# Patient Record
Sex: Female | Born: 1960 | Race: White | Hispanic: No | Marital: Married | Smoking: Current some day smoker
Health system: Southern US, Community
[De-identification: ages and names within clinical notes are randomized; demographics above are authoritative.]

## PROBLEM LIST (undated history)

## (undated) ENCOUNTER — Ambulatory Visit

## (undated) DIAGNOSIS — R519 Headache, unspecified: Secondary | ICD-10-CM

## (undated) DIAGNOSIS — K219 Gastro-esophageal reflux disease without esophagitis: Secondary | ICD-10-CM

## (undated) DIAGNOSIS — R002 Palpitations: Secondary | ICD-10-CM

## (undated) DIAGNOSIS — K5900 Constipation, unspecified: Secondary | ICD-10-CM

## (undated) DIAGNOSIS — J189 Pneumonia, unspecified organism: Secondary | ICD-10-CM

## (undated) DIAGNOSIS — B958 Unspecified staphylococcus as the cause of diseases classified elsewhere: Secondary | ICD-10-CM

## (undated) DIAGNOSIS — J449 Chronic obstructive pulmonary disease, unspecified: Secondary | ICD-10-CM

## (undated) DIAGNOSIS — M1990 Unspecified osteoarthritis, unspecified site: Secondary | ICD-10-CM

## (undated) DIAGNOSIS — Z8489 Family history of other specified conditions: Secondary | ICD-10-CM

## (undated) DIAGNOSIS — K828 Other specified diseases of gallbladder: Secondary | ICD-10-CM

## (undated) DIAGNOSIS — R51 Headache: Secondary | ICD-10-CM

## (undated) DIAGNOSIS — F419 Anxiety disorder, unspecified: Secondary | ICD-10-CM

## (undated) DIAGNOSIS — I10 Essential (primary) hypertension: Secondary | ICD-10-CM

## (undated) HISTORY — PX: TONSILLECTOMY: SUR1361

## (undated) HISTORY — PX: TUBAL LIGATION: SHX77

## (undated) HISTORY — PX: GROIN DEBRIDEMENT: SHX5159

---

## 1997-12-22 ENCOUNTER — Emergency Department (HOSPITAL_COMMUNITY): Admission: EM | Admit: 1997-12-22 | Discharge: 1997-12-22 | Payer: Self-pay | Admitting: Emergency Medicine

## 2000-02-12 ENCOUNTER — Emergency Department (HOSPITAL_COMMUNITY): Admission: EM | Admit: 2000-02-12 | Discharge: 2000-02-13 | Payer: Self-pay | Admitting: Emergency Medicine

## 2000-12-14 ENCOUNTER — Encounter: Admission: RE | Admit: 2000-12-14 | Discharge: 2000-12-14 | Payer: Self-pay | Admitting: Internal Medicine

## 2003-08-24 ENCOUNTER — Inpatient Hospital Stay (HOSPITAL_COMMUNITY): Admission: EM | Admit: 2003-08-24 | Discharge: 2003-08-28 | Payer: Self-pay | Admitting: Emergency Medicine

## 2003-11-17 ENCOUNTER — Inpatient Hospital Stay (HOSPITAL_COMMUNITY): Admission: EM | Admit: 2003-11-17 | Discharge: 2003-11-19 | Payer: Self-pay | Admitting: Emergency Medicine

## 2004-03-03 ENCOUNTER — Inpatient Hospital Stay (HOSPITAL_COMMUNITY): Admission: EM | Admit: 2004-03-03 | Discharge: 2004-03-04 | Payer: Self-pay | Admitting: Emergency Medicine

## 2004-03-05 ENCOUNTER — Ambulatory Visit: Payer: Self-pay | Admitting: Internal Medicine

## 2004-03-05 ENCOUNTER — Inpatient Hospital Stay (HOSPITAL_COMMUNITY): Admission: EM | Admit: 2004-03-05 | Discharge: 2004-03-09 | Payer: Self-pay | Admitting: Emergency Medicine

## 2005-05-17 ENCOUNTER — Inpatient Hospital Stay (HOSPITAL_COMMUNITY): Admission: EM | Admit: 2005-05-17 | Discharge: 2005-05-19 | Payer: Self-pay | Admitting: Emergency Medicine

## 2007-06-13 ENCOUNTER — Emergency Department (HOSPITAL_COMMUNITY): Admission: EM | Admit: 2007-06-13 | Discharge: 2007-06-14 | Payer: Self-pay | Admitting: Emergency Medicine

## 2008-03-17 ENCOUNTER — Emergency Department (HOSPITAL_COMMUNITY): Admission: EM | Admit: 2008-03-17 | Discharge: 2008-03-17 | Payer: Self-pay | Admitting: Emergency Medicine

## 2008-09-12 ENCOUNTER — Emergency Department (HOSPITAL_COMMUNITY): Admission: EM | Admit: 2008-09-12 | Discharge: 2008-09-12 | Payer: Self-pay | Admitting: Family Medicine

## 2010-08-13 NOTE — Consult Note (Signed)
[name], GRAYS                 ACCOUNT NO.:  [id number]   MEDICAL RECORD NO.:  [id number]          PATIENT TYPE:  INP   LOCATION:  5714                         FACILITY:  [facility]   PHYSICIAN:  Jefry H. Pollyann Kennedy, MD     DATE OF BIRTH:  Jun 23, 1960   DATE OF CONSULTATION:  03/03/2004  DATE OF DISCHARGE:                                   CONSULTATION   REASON FOR CONSULTATION:  Facial cellulitis.   HISTORY:  This is a 50 year old with a history of MRSA cellulitis on two  occasions in the past six months or so.  About two days ago, she developed a  small pimple like lesion on the right cheek and over 48 hours, it has  progressed and today she developed severe swelling around the right eye.  She is being admitted to the hospital by inpatient service and is going to  be started on intravenous vancomycin for presumed MRSA.   PAST MEDICAL HISTORY:  Significant for COPD and heavy smoking history.  The  remainder of the past medical history is on the admission history and  physical.   PHYSICAL EXAMINATION:  She is a relatively healthy appearing lady awake and  alert without any respiratory distress.  There is no palpable cervical  adenopathy.  Oral cavity and pharynx are clear.  The dentition is in good  repair.  Nasal exam is clear, as well.  There is significant eyelid edema of  the right upper and lower eyelids.  When open, the globe appears to be  normal with normal mobility.  Gross visual acuity is intact.  There is a  small scab on the right maxillary prominence with surrounding erythema and  some tenderness.  There is pale edema of the eyelids.  The erythema extends  for approximately 2-3 cm.  The remainder of the face is unremarkable.   IMPRESSION:  Probable recent MRSA facial cellulitis, possible very early  small abscess.   RECOMMENDATIONS:  Initiate intravenous  antibiotics and I will re-evaluate  in 24 hours.  If there is no significant improvement, then I will perform  incision  and drainage at the bedside.      Jefr   JHR/MEDQ  D:  03/03/2004  T:  03/03/2004  Job:  [id number]

## 2010-08-13 NOTE — Discharge Summary (Signed)
[name], [name] NO.:  [id number]   MEDICAL RECORD NO.:  [id number]          PATIENT TYPE:  INP   LOCATION:  1415                         FACILITY:  [facility]   PHYSICIAN:  Sherin Quarry, MD      DATE OF BIRTH:  Oct 22, 1960   DATE OF ADMISSION:  05/17/2005  DATE OF DISCHARGE:                                 DISCHARGE SUMMARY   [name] is a 50 year old lady who was initially transported to the  [facility] emergency room on February 20 with a report of making a suicide  attempt and cutting her wrists. In the emergency room, the patient was  lethargic and was given IV Narcan and with no change in her status. Her  urine drug screen was subsequently positive for marijuana and cocaine. The  patient's suicide attempt was apparently triggered by an argument with her  boyfriend.   PAST MEDICAL HISTORY:  Her past history is largely remarkable for a  longstanding history of polysubstance abuse and a heavy cigarette smoking  with COPD.   PHYSICAL EXAMINATION:  Initial physical exam as described by Dr. Derenda Mis:  VITAL SIGNS:  The temperature was 97.8, blood pressure 121/73, pulse was 87,  respirations 27.  GENERAL:  The patient was a disheveled somnolent lady who would arouse to  painful stimuli.  HEENT:  Within normal limits.  CHEST:  The chest was remarkable for upper airway rhonchi and diminished  breath sounds.  CARDIOVASCULAR:  Revealed a regular rate and rhythm.  ABDOMEN:  The abdomen was benign.  NEUROLOGIC:  On neurologic testing, the patient would arouse to aggressive  stimuli. She would not follow commands. She was noted to move all  extremities.  EXTREMITIES:  On examination of the extremities, the patient had a  superficial laceration of the left wrist.   LABORATORY DATA:  Relevant laboratory studies obtained included a BMET which  was normal. The blood alcohol level was less than 5. A urine drug screen was  positive for benzodiazepines and  cocaine. CBC, white count 7400, hemoglobin  12.7, liver functions were normal. Serial cardiac enzymes were negative. The  patient had arterial blood gases obtained and these showed a pO2 of 79, pCO2  of 37. On room air O2 sat was 96%. A chest x-ray showed evidence of  bronchitis. A CAT scan of the head was negative.   HOSPITAL COURSE:  On admission the patient was placed on an IV of normal  saline at 75 mL/hour. Zofran was administered p.r.n. for nausea and Xopenex  nebulizer p.r.n. for wheezing. Avelox was started empirically at a dose of  400 mg daily for possible bronchitis. The next day the patient became much  more alert. Her Foley catheter was discontinued. She was seen by Dr.  Jeanie Sewer to the psychiatry department. It was the general opinion of  Dr.  Jeanie Sewer that the patient's suicide attempt was largely secondary to the  influence of drugs and that she was no longer a risk to herself or others.  She does not meet the criteria for commitment but indicated after medical  clearance she could be discharged. By February 22, the patient was feeling  well. She had no respiratory distress, no chest pain, no abdominal  discomfort. She was alert. It was therefore felt reasonable to proceed with  discharge. At the time of discharge I urged her to follow through with the  recommendations of Dr. Jeanie Sewer for outpatient mental health care through  the mental health clinic. She smokes about two packs of cigarettes per day  and in the past had used a nebulizer on a regular basis. I therefore  provided her with prescriptions for albuterol for her nebulizer as well as  for a Combivent meter dose inhaler to use p.r.n. She was also given a  prescription for Avelox with instructions to take this medication for two  additional days for bronchitis. I urged her to  establish a primary care  doctor. She said that in the past she had been to the [facility] outpatient  clinic for her medical care and I  suggested that she continue to follow up  with this facility.   DISCHARGE DIAGNOSES:  1.  Suicide attempt.  2.  Cocaine and Klonopin abuse.  3.  Chronic obstructive pulmonary disease.  4.  History of MRSA cellulitis.           ______________________________  Sherin Quarry, MD     SY/MEDQ  D:  05/19/2005  T:  05/20/2005  Job:  [id number]   cc:   Antonietta Breach, M.D.

## 2010-08-13 NOTE — Consult Note (Signed)
[name], NO                             ACCOUNT NO.:  [id number]   MEDICAL RECORD NO.:  [id number]                   PATIENT TYPE:  INP   LOCATION:  5737                                 FACILITY:  [facility]   PHYSICIAN:  Dionne Ano. Everlene Other, M.D.         DATE OF BIRTH:  Oct 07, 1960   DATE OF CONSULTATION:  11/17/2003  DATE OF DISCHARGE:                                   CONSULTATION   I had the pleasure to see [name] for hand and upper extremity  consultation about the right hand.  This patient is a 50 year old female  with history of MRSA infection that was I&Dd in June of 2005.  She presented  to the emergency room with MRSA infection supposed about the right thumb.  She also had right axillary and right inguinal regions which were I&Dd in  the emergency room.  She is known to have a history of COPD, low back pain,  tobacco, and cocaine abuse as well.  She denies trauma to the area.  She  denies numbness and tingling in her thumb.  I have discussed her all issues.   The patient will be checked in regards to her tetanus status and it will be  updated, if appropriate I should know.   PAST MEDICAL HISTORY:  1. COPD.  2. Degenerative disk disease.   PAST SURGICAL HISTORY:  1. I&D of her leg June 2005.  2. Bilateral tubal ligation.   MEDICATIONS:  None.   ALLERGIES:  PENICILLIN, CODEINE, a likely VANCOMYCIN allergy.  She had no  reaction during vancomycin administration today.   SOCIAL HISTORY:  She is unemployed.  Smokes a quarter of a pack per day.  She recently used cocaine within the last 24 hours.  She also consumes  marijuana on a regular basis.   PHYSICAL EXAMINATION:  GENERAL:  She is alert and oriented, in no acute  distress.  EXTREMITIES:  The patient's right hand has a large abscess over the thumb.  This is painful to touch.  There is erythema and no gross signs of  dysvascular changes.  EPL and FPL are intact.  There is no signs of  dystrophic features.   The shoulder joint, elbow joint, and wrist joint are  nontender.  She has had an I&D of her inguinal region and axillary region;  however, I did not take these down and examine them.   IMPRESSION:  Abscess right thumb.   PLAN:  I have discussed her options for treatment.  She desires to proceed  with surgical intervention.   She is taken to surgical area on the 5700 floor.  She was given __________  metacarpal block followed by a Betadine scrub and paint.  She was sterilely  secured and underwent an incision radial laterally about the thumb.  Dissection was carried down and large amount of purulence was obtained.  Aerobic and anaerobic cultures were taken.  This  was deep space I&D.  Perinecrotic tissue was removed.  She was irrigated with greater than 1 L of  fluid and following this was packed open.  I will have her monitored  closely.  Sterile dressing was applied and refill was intact.  We are going  to have her elevate the thumb.  Will begin whirlpools tomorrow and pack her  with iodoform daily.  I have discussed with the patient __________, etc. and  plans for treatment in the future.  Will monitor condition quite closely.  All questions have been encouraged and answered.  I would certainly  recommend ID consult given her history of MRSA infection, so wait the  culture results.  If this is MRSA I would likely pursue prevention measures  and discuss these issues as well as lifestyle modification changes to the  patient as well.                                               Dionne Ano. Everlene Other, M.D.    Nash Mantis  D:  11/17/2003  T:  11/17/2003  Job:  [id number]   cc:   C. Ulyess Mort, M.D.  [address]  [place]  [place] [address]  Fax: [phone]

## 2010-08-13 NOTE — Discharge Summary (Signed)
[name], [name]                 ACCOUNT NO.:  [id number]   MEDICAL RECORD NO.:  [id number]          PATIENT TYPE:  INP   LOCATION:  5714                         FACILITY:  [facility]   PHYSICIAN:  C. Ulyess Mort, M.D.DATE OF BIRTH:  1961-03-03   DATE OF ADMISSION:  03/03/2004  DATE OF DISCHARGE:  03/04/2004                                 DISCHARGE SUMMARY   CONTINUITY CLINIC PHYSICIAN/OUTPATIENT CLINIC PHYSICIAN:  Chapman Fitch, M.D.   CONSULTANTSEnrigue Catena H. Pollyann Kennedy, M.D. of ear, nose, and throat specialty.   DISCHARGE DIAGNOSES:  1.  Methicillin-resistant Staphylococcus aureus cellulitis of right eye.  2.  Chronic obstructive pulmonary disease.  3.  Polysubstance abuse.   DISCHARGE MEDICATIONS:  1.  Doxycycline 100 mg b.i.d.  2.  Percocet 5/325, 1-2 tabs every four hours for pain.  3.  Albuterol inhaler.   PROCEDURES:  None.   CHIEF COMPLAINT:  Right eye swelling.   HISTORY OF PRESENT ILLNESS:  A 50 year old female present with right eye  swelling and multiple prior MRSA infections.  A two day history of swelling  and itching.  The swelling progressively got worse.  On the morning of  presentation, the swelling was so much worse she had problems seeing out of  her right eye.   ALLERGIES:  1.  PENICILLIN which causes swelling.  2.  VANCOMYCIN, per the patient causes itching and upset stomach.   PAST MEDICAL HISTORY:  1.  Multiple MRSA subcu infections.  2.  COPD on albuterol.  3.  Chronic back pain.  4.  Polysubstance abuse including marijuana, cocaine positive.  5.  Tobacco use.  6.  Bipolar disorder.   MEDICATIONS:  Albuterol inhaler.   PHYSICAL EXAMINATION:  VITAL SIGNS:  The patient was afebrile, temperature  was 98.2, pulse 98, blood pressure 140/91, respirations 20, and O2  saturation 98% on room air.  GENERAL:  The patient is anxious and ravenously eating her dinner.  HEENT:  The right eyelid is erythematous and swollen and tender.  Her  oropharynx is  clear.  NEURO: Eyes are equal and reactive to light and accommodate.  Her  extraocular muscles are intact.  LYMPH:  No lymphadenopathy appreciated.  CARDIOVASCULAR:  Regular rate and rhythm.  No murmurs, rubs or gallops.  PULMONARY:  Clear to auscultation bilaterally.  ABDOMEN:  Nontender, nondistended.  Bowel sounds positive.   ADMISSION LABS:  Include a basic metabolic panel as follows:  Sodium 136,  potassium 3.7, chloride 107, bicarb 25, BUN 6, creatinine 0.6, glucose 115.  A CBC as follows:  White count is 16.2, hemoglobin hematocrit 14.4 and 42.5  respectively, platelets 347.  Urine pregnancy test was negative.  Urinalysis  showed trichomonas.  UDS showed cocaine and GHS.   A CT scan of the head was done in the ER.  The CT was negative for abscess.  The CT showed only subcutaneous inflammation.  There was no involvement of  the right orbit.   HOSPITAL COURSE:  1.  MRSA cellulitis of the right eye, right face.  The patient was started      on  IV vancomycin.  The physicians were aware of the patient's supposed      vancomycin allergy.  The patient was given Benadryl and Pepcid to      prevent an adverse reaction.  Blood cultures were drawn in the ED.  A      consultation was made in request of an ears, nose, and throat physician      for possible drainage.  The ears, nose, and throat physician did see      her, Dr. Pollyann Kennedy, on March 04, 2004.  Dr. Pollyann Kennedy recommended continuing      antibiotics but did not recommend incision and drainage because there      was no detectable abscess.  Throughout the day, Thursday, March 04, 2004, the patient did improve.  She remained afebrile.  The plan of      care, explained extensively with the patient, was to wait for the blood      culture results and also lab sensitivities of the bacteria.  Pending      those results and appropriate antibiotic coverage, we explained with the      patient, that she would probably be discharged from the  hospital the      following day, that being Friday, March 05, 2004.  We explained to her      that we would convert her antibiotics from IV vancomycin to p.o.      antibiotics including doxycycline.  The patient did not agree with this      plan.  She demanded that she leave on Thursday, March 04, 2004.  We      explained this was not in her best interest; however, the patient      insisted on leaving; therefore, the team prescribed her doxycycline 100      mg one tab two times a day until she ran out of her pills and Percocet      for pain control.  2.  Trichomonas.  The patient was found to have Trichomonas in her urine at      presentation.  We started her on Flagyl while in the hospital.  3.  COPD.  This problem was not active during this hospitalization.  4.  Polysubstance abuse.  Cessation of cocaine and marijuana was discussed      in depth with the patient during her hospital stay.  The patient      admitted to smoking marijuana every day.  She denied any cocaine use.      We explained to her that her substance abuse causes her immune system to      be more susceptible to infections.       IO/MEDQ  D:  03/09/2004  T:  03/09/2004  Job:  [id number]

## 2010-08-13 NOTE — Discharge Summary (Signed)
[name], [name]                             ACCOUNT NO.:  [id number]   MEDICAL RECORD NO.:  [id number]                   PATIENT TYPE:  INP   LOCATION:  6713                                 FACILITY:  [facility]   PHYSICIAN:  Corinna L. Lendell Caprice, MD             DATE OF BIRTH:  11/10/1960   DATE OF ADMISSION:  08/23/2003  DATE OF DISCHARGE:                                 DISCHARGE SUMMARY   DIAGNOSES:  1. 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 Jimmye Norman, III.  2. Cultures positive for methicillin-resistant Staphylococcus aureus.  3. Chronic obstructive pulmonary disease, stable.  4. Chronic back pain.  5. Cocaine abuse.   DISCHARGE MEDICATIONS:  1. Doxycycline 100 mg p.o. b.i.d. for 10 days.  2. Percocet or Tylenol as needed for pain.   DISCHARGE ACTIVITY:  1. No driving while on pain medication.   DISCHARGE INSTRUCTIONS:  1. Home Health is being arranged for wet-to-dry dressing changes with saline     b.i.d.  2. Follow up with Dr. Lindie Spruce on June 13.   DISCHARGE CONDITION:  Stable.   DISCHARGE DIET:  Regular.   HOSPITAL COURSE:  [name] is a 50 year old white female who presented to  the emergency room with complaints of a bump in the perineal region.  She  has been having fever and anorexia for the previous week.  She denied IV  drug abuse.  She had a temperature of 102, pulse of 111, otherwise, her  vital signs are normal.  She had fluctuance in the right perineal area with  surrounding cellulitis and extension of the cellulitis medially to the knee  and also to the labia.  Her white blood cell count was 26,000, and her  potassium was 2.9.  Urine pregnancy negative.  Blood alcohol less than 5.  Urine drug screen positive for cocaine.  She was admitted to the hospitalist  service as she has no primary care physician.  Dr. Soyla Dryer promptly  consulted Dr. Lindie Spruce who took the patient emergently to the operating  room  for drainage, and felt that this was in fact an abscess with early  Fournier's gangrene.  The patient was started on IV antibiotics which were  subsequently switched to vancomycin IV given the culture result significant  for methicillin-resistant Staphylococcus aureus.  It was, however, sensitive  to tetracycline, and she can therefore be  discharged on doxycycline.  At the time of discharge, her cellulitis and  wound looked much better.  She had been receiving b.i.d. dressing changes  with normal saline, soaked gauze packing.  She was afebrile, and her white  blood cell count had decreased.  Also during her hospitalization, her  potassium was repleted orally and IV.  Corinna L. Lendell Caprice, MD    CLS/MEDQ  D:  08/28/2003  T:  08/29/2003  Job:  [id number]

## 2010-08-13 NOTE — Op Note (Signed)
[name], [name]                 ACCOUNT NO.:  [id number]   MEDICAL RECORD NO.:  [id number]          PATIENT TYPE:  INP   LOCATION:  5009                         FACILITY:  [facility]   PHYSICIAN:  Suzanna Obey, M.D.       DATE OF BIRTH:  04/11/1960   DATE OF PROCEDURE:  03/07/2004  DATE OF DISCHARGE:                                 OPERATIVE REPORT   PREOPERATIVE DIAGNOSIS:  Right facial abscess.   POSTOPERATIVE DIAGNOSIS:  Right facial abscess.   OPERATION PERFORMED:  Incision and drainage of right facial abscess.   SURGEON:  Suzanna Obey, M.D.   ANESTHESIA:  General endotracheal.   ESTIMATED BLOOD LOSS:  Approximately 5 mL.   INDICATIONS FOR PROCEDURE:  This is a 50 year old who has had multiple MRSA  abscesses on her body and now has had some right facial swelling.  She has  been treated and then left the hospital against medical advice and it  worsened and then came back to the hospital and has been treated with  vancomycin for the last couple of days.  She has swelling around the eye  which did improve but today it has worsened.  It does have some fluctuance  to the skin now.  She was informed of the risks and benefits of the  procedure including bleeding, infection, scarring, facial nerve weakness or  paralysis, injury to the lacrimal sac, eye injury, and risk of the  anesthetic.  All questions were answered and consent was obtained.   DESCRIPTION OF PROCEDURE:  The patient was taken to the operating room and  placed supine position.  After adequate general endotracheal tube  anesthesia, was placed in the supine position, prepped and draped in the  usual sterile manner.  The crusted debris was cleaned off the skin and  incision was made over the pointed area that was in the midcheek region.  Significant amount of purulent material came out of this location and it was  cultured both anaerobic and aerobic.  Hemostat was used to open it up gently  and there was more purulence.   Up superior just along the lateral aspect of  the nose inferior to the lacrimal sac was another fluctuant area that was  open and again some purulent material came out of this.  Using a hemostat  from below, dissection was carried up to the superior aspect where the  incision was made and these two areas did connect.  Both areas were  irrigated copiously with saline.  The iodoform gauze was placed into both  areas.  The patient was then awakened and brought to recovery in stable  condition, counts correct.       JB/MEDQ  D:  03/07/2004  T:  03/08/2004  Job:  [id number]

## 2010-08-13 NOTE — Consult Note (Signed)
NAMEENDIA, MONCUR                             ACCOUNT NO.:  [id number]   MEDICAL RECORD NO.:  [id number]                   PATIENT TYPE:  EMS   LOCATION:  MAJO                                 FACILITY:  [facility]   PHYSICIAN:  Jimmye Norman III, M.D.               DATE OF BIRTH:  29-Dec-1960   DATE OF CONSULTATION:  DATE OF DISCHARGE:                                   CONSULTATION   CHIEF COMPLAINT:  The patient is a 50 year old with significant cellulitis  of the right thigh and perineum with a perineal abscess.   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  This progressed to where she has  fulminant right leg cellulitis with a white count of 26,000 and a foul-  smelling drainage from her perineal area.  Medicine saw her mostly for the  cellulitis and they have sent her here for possible drainage, and I am now  taking her to the operating room for drainage of a large abscess.   PAST MEDICAL HISTORY:  Significant for chronic obstructive pulmonary disease  for which she takes albuterol, no steroids.  She is a chronic and continuing  smoker of greater than a pack per day.  She takes albuterol only.  She also  has a history of degenerative joint disease of the back, may be causing some  pain but has not had any surgery on her back.   ALLERGIES:  PENICILLIN (she gets itching).   PAST SURGICAL HISTORY:  She has had a tubal ligation before and an operation  on her right thigh for removal of a needle when she was much younger.  She  is gravida 3 para 3 by report.   PHYSICAL EXAMINATION:  VITAL SIGNS:  She is currently afebrile, her vital  signs are stable.  HEENT:  She is normocephalic, atraumatic and anicteric.  NECK:  Supple.  CHEST:  Clear.  CARDIAC:  Regular  rhythm and rate with no murmurs, gallops or rubs.  ABDOMEN:  Soft and nontender.  She has bowel sounds.  In the perineal area  she has significant cellulitis of the medial thigh on the right side with a  2 cm to 3 cm fluctuant, very indurated and foul-smelling drainage area with  some necrosis in the center.   IMPRESSION:  Fluctuant perineal structural possible abscess of significance  from cellulitis.   LABORATORIES:  The patient had a tox screen positive for cocaine and  tetrahydrocannabinol (THC).  A CA level was drawn and is currently pending.  Her white count was over 20,000.   PLAN:  The plan is to take her to the operating room for  drainage of this  abscess.  She will also get some significant antibiotics to include  __________ and clindamycin.  We will culture her for specific organisms and  taper her antibiotics and cover this with specific antibiotics.                                               Kathrin Ruddy, M.D.    JW/MEDQ  D:  08/24/2003  T:  08/24/2003  Job:  [id number]

## 2010-08-13 NOTE — Discharge Summary (Signed)
[name], [name]                 ACCOUNT NO.:  [id number]   MEDICAL RECORD NO.:  [id number]          PATIENT TYPE:  INP   LOCATION:  5009                         FACILITY:  [facility]   PHYSICIAN:  C. Ulyess Mort, M.D.DATE OF BIRTH:  Mar 28, 1961   DATE OF ADMISSION:  03/05/2004  DATE OF DISCHARGE:  03/09/2004                                 DISCHARGE SUMMARY   The patient was discharged against medical advice.  CONTINUITY CLINIC PERMISSION TO OUTPATIENT CLINIC:  None.   CONSULTATIONS:  Dr. Suzanna Obey, ears/nose/throat.   DISCHARGE DIAGNOSES:  1.  Methicillin-resistant Staphylococcus aureus abscess of the right face.  2.  Chronic obstructive pulmonary disease.  3.  Substance abuse.   DISCHARGE MEDICATIONS:  1.  Prescribed by Dr. Suzanna Obey.  The dosage of the medications is unknown.      The discharge medications per the patient's nurse that Dr. Jearld Fenton      prescribed were as follows:  Doxycycline.  2.  Percocet.   CONSULTATIONS:  Dr. Jearld Fenton, ears/nose/throat physician.   PROCEDURE:  Incision and drainage of right facial abscess by Dr. Jearld Fenton.   CHIEF COMPLAINT:  Continue eye swelling and extension of facial rash.   HISTORY OF PRESENT ILLNESS:  This 50 year old with multiple prior admission  for methicillin-resistant Staphylococcus aureus cellulitis/abscesses and  infections, returns to the emergency room after leaving against medical  advice yesterday on March 04, 2004.  The patient complains of increased  swelling and pain of the right eye.  The patient was admitted on March 03, 2004, for the same complaint.  Since leaving the hospital yesterday, the  swelling has increased, and the abscess on her face has ruptured in the  interim.   PAST MEDICAL HISTORY:  1.  Prior methicillin-resistant Staphylococcus aureus infections and      abscesses.  2.  Chronic obstructive pulmonary disease.  3.  Polysubstance abuse.   MEDICATIONS:  1.  The patient was discharged home from  the initial hospital admission on      doxycycline p.o.  2.  Albuterol.   ALLERGIES:  1.  PENICILLIN causes swelling.  2.  VANCOMYCIN causes her to itch.  The physicians are aware of this.   PHYSICAL EXAMINATION:  VITAL SIGNS:  Temperature 99.2 degrees, respirations  22, blood pressure 149/95, O2 saturation 96% on room air.  HEENT:  Right eyelid:  There was swelling.  Extraocular muscles intact.  On  the right cheek there is a draining break in the skin 3 cm below the edge of  the swelling.  In the emergency room blood cultures were drawn again.  In the emergency  room the white blood cell count was 18.9.   HOSPITAL COURSE:  #1 - METHICILLIN-RESISTANT STAPHYLOCOCCUS AUREUS ABSCESS  OF THE RIGHT FACE:  The patient was admitted and resumed on her previous  dosage of vancomycin 1 g q.12h.  The patient was seen by Dr. Jearld Fenton on  March 05, 2004.  Dr. Jearld Fenton thought that the cellulitis had evolved to such  a degree that an abscess may have formed.  On March 06, 2004,  the patient  was continued on the vancomycin treatment.  Her white cell count dropped to  12.4 from 18.9 on admission.  Subjectively the patient improved.  She  remained afebrile throughout the day.  Throughout the rest of the hospital stay, the patient remained afebrile and  her white blood cell count remained  below 12.5.  Dr. Jearld Fenton performed an  incision and drainage on March 07, 2004.  The procedure went well, with  no complications.  On March 08, 2004, the patient was again afebrile and  her white cell count was 8.7.  The plan of care at that point was to await  her blood culture results before considering discharge.  The reason for this  was to make sure that if the patient was septic, that she would be covered  by her current antibiotics.  The patient agreed with this plan of care.  On  March 08, 2004, her vancomycin dosage was increased to 1 g IV q.8h.  On March 09, 2004, the patient agreed with staying  in the hospital until  her blood cultures returned that day.  She expressed that she wanted to be  completely well before leaving the hospital.  Despite her promise, the  patient left the hospital with an acquaintance at 1:30 p.m. on March 09, 2004.  Fortunately the patient did receive a prescription of doxycycline and  Percocet.  Dr. Jearld Fenton, her ENT physician prescribed this.  The patient has a  follow-up appointment with Dr. Jearld Fenton at his clinic in one week.  #2 - CHRONIC OBSTRUCTIVE PULMONARY DISEASE:  This problem was not  an issue  during this hospitalization.  #3 - POLYSUBSTANCE ABUSE:  Smoking cessation and cannabis abuse and cocaine  abuse were all discussed in depth with the patient.       IO/MEDQ  D:  03/09/2004  T:  03/09/2004  Job:  [id number]

## 2010-08-13 NOTE — Op Note (Signed)
[name], [name]                             ACCOUNT NO.:  [id number]   MEDICAL RECORD NO.:  [id number]                   PATIENT TYPE:  INP   LOCATION:  1828                                 FACILITY:  [facility]   PHYSICIAN:  Jimmye Norman III, M.D.               DATE OF BIRTH:  1960/06/16   DATE OF PROCEDURE:  08/24/2003  DATE OF DISCHARGE:                                 OPERATIVE REPORT   PREOPERATIVE DIAGNOSIS:  Right peritoneum abscess with surrounding  cellulitis.   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   PROCEDURE:  Incision, drainage, and packing of right peritoneal abscess.   SURGEON:  Dr. Lindie Spruce   ASSISTANT:  None.   ANESTHESIA:  General endotracheal.   ESTIMATED BLOOD LOSS:  Less than 20 mL.   COMPLICATIONS:  None.   CONDITION:  Stable.   SPECIMENS:  Aerobic and anaerobic cultures.   INDICATION FOR OPERATION:  The patient is a 50 year old, who has, for the  last 2 days, has developed worsening cellulitis and drainage of an abscess  of her right leg, who comes in now for drainage.   FINDINGS:  The patient had a very indurated but not as fluctuant area in the  right posterior and medial thigh.  It extended up towards the posterior  aspect of the vulva on the right side.  Upon opening it, there was a deep  pocket of pus in the superfascial plane.  In the deep subcu, an oval piece  of tissue was removed, and the cultures were sent.   OPERATION:  The patient was taken to the operating room, placed on the table  in a supine position.  After an adequate endotracheal anesthetic was  administered, she was placed in lithotomy position and prepped and draped in  the usual sterile manner.   A #15 blade was used to make an oval incision longitudinally around the  necrotic center of the draining abscess cavity.  It was taken down into the  subcutaneous tissue and subsequently cut out.  In the deep plane, there was  some watery,  milky white pus which drained out, and we were able to open a  pocket superiorly and anteriorly along the thigh, all of which drained this  milky white, sort of watery-type pus.  The extent of the cavity was probably  about 10 cm in total length with a depth of about 3-4 cm.  We open up the  incision approximately 5 cm, irrigated out with saline solution prior to  packing it with an entire bottle of half-inch Iodoform NuGauze.  We made  sure that the plane  did not extend more medially, inferiorly, posteriorly, or superiorly, and no  further pockets were noted.  After it was packed, a sterile dressing was  applied.  All needle counts, sponge counts, and instrument counts were  correct.  The patient was  taken to the recovery room in stable condition.                                               [name], M.D.    JW/MEDQ  D:  08/24/2003  T:  08/24/2003  Job:  [id number]

## 2010-08-13 NOTE — Discharge Summary (Signed)
[name], [name]                             ACCOUNT NO.:  [id number]   MEDICAL RECORD NO.:  [id number]                   PATIENT TYPE:  INP   LOCATION:  5737                                 FACILITY:  [facility]   PHYSICIAN:  C. Ulyess Mort, M.D.             DATE OF BIRTH:  [birth year]   DATE OF ADMISSION:  11/17/2003  DATE OF DISCHARGE:  11/19/2003                                 DISCHARGE SUMMARY   ATTENDING PHYSICIAN:  C. Ulyess Mort, M.D.   RESIDENT PHYSICIAN:  Zetta Bills, M.D.   INTERN:  Keitha Butte, M.D.   DISCHARGE DIAGNOSES:  1. Methicillin-resistant Staphylococcus aureus abscesses on right thumb,     right axilla, and right inguinal area.  2. Right perineal abscess with __________ gangrene status post incision and     drainage in May 2005 which was methicillin-resistant Staphylococcus     aureus positive.  3. Chronic obstructive pulmonary disease.  4. Chronic back pain.  5. Cocaine abuse.  6. Tobacco abuse.  7. Bipolar disorder.  8. Suicide attempt in 1983.   DISCHARGE MEDICATIONS:  1. Doxycycline 100 mg oral b.i.d. for 42 days.  2. Ultram 50 mg q.i.d. as needed for pain.  3. Percocet 5/325 tablet one to two as needed for pain.  4. Albuterol metered dose inhaler one puff q.6h.   FOLLOW-UP:  The patient will follow up with Dr. Amanda Pea on November 24, 2003 at  [facility].  The patient will also follow up with Dr. Zetta Bills at [facility] on December 10, 2003.   PROCEDURES:  1. Incision and drainage of abscesses in the right axilla and inguinal area     done by ED physician.  2. Incision and drainage of the thumb abscess done by Dr. Amanda Pea.   CONSULTATIONS:  Dr. Dominica Severin with [facility].   HISTORY OF PRESENT ILLNESS WITH ADMISSION LABORATORY DATA:  This is a 50-  year-old white [name] with past medical history significant for MRSA  cellulitis status post incision and drainage in May 2005, admitted  this time  with abscesses in the right axilla, right inguinal region, and right thumb.  She reported having a temperature of 101 the night before she came to the  hospital.  She attributes the thumb lesion to a spider bite 2 days prior to  coming to the hospital.  Admission vital signs:  Pulse 83, blood pressure  135/87, temperature 97.8, respiratory rate of 22, SAO2 of 98% on room air.  Labs on admission:  Hemoglobin of 15.1, hematocrit of 44.7, wbc count of  13.7, with an ANC of 9, platelet count of 377, MCV of 81.7.  Sodium of 135,  potassium of 3.8, chloride of 106, bicarb of 23, BUN of 4, creatinine of  0.6, blood glucose of 102, total bilirubin of 0.8, alkaline phosphatase of  72, SGOT 27, SGPT 25, total protein 7.5,  albumin 3.4, calcium 9.  Urinalysis:  Clear, yellow in color, specific gravity of 1.010; negative for  glucose, bilirubin, ketones, blood, nitrites, and leukocytes.  Urine drug  screen positive for cocaine and marijuana.   HOSPITAL COURSE:  #1 - METHICILLIN-RESISTANT STAPHYLOCOCCUS AUREUS  ABSCESSES.  Abscesses of the right inguinal area and the right axilla  drained and the fluid from the abscesses sent for culture and sensitivity  and failed to grow any organisms.  Abscess of the right thumb drained and  fluid from the abscess sent for culture and sensitivity and it grew MRSA.  The patient was initially started on IV vancomycin 1 g daily until the  culture and sensitivity results were back (covering MRSA owing to the past  medical history of MRSA cellulitis).  Later, after the culture and  sensitivity results came back and it showed MRSA which was sensitive to  tetracycline, she was switched to oral doxycycline 100 mg b.i.d. for 42 days  and she was discharged home on oral doxycycline.  During this admission she  got hydrotherapy for her thumb wound after it was drained and daily dressing  changes, and she received Percocet for pain p.r.n.  She was discharged with   instructions and precautions and education about MRSA.  She was educated to  use antibacterial soaps like Dial or Lever 2000, use antiseptic solutions  like Purell hand sanitizer, and also instructed to wash her clothes, her  towels, and linens in hot water and to dry them up in the dryer instead of  using the clothes line - all these as precautionary measures for preventing  future recurrences of MRSA.  During the hospital course she was taught how  to change from wet to dry dressings and how to change her daily dressing.  At the time of discharge she was afebrile and her wbc count had started  decreasing.   #2 - The patient was counseled about cocaine abuse.      Zetta Bills, MD                             Gary Fleet, M.D.    JP/MEDQ  D:  11/20/2003  T:  11/20/2003  Job:  [id number]

## 2010-08-13 NOTE — H&P (Signed)
[name], [name]                 ACCOUNT NO.:  [id number]   MEDICAL RECORD NO.:  [id number]          PATIENT TYPE:  EMS   LOCATION:  ED                           FACILITY:  [facility]   PHYSICIAN:  Melissa L. Ladona Ridgel, MD  DATE OF BIRTH:  10/25/1960   DATE OF ADMISSION:  05/17/2005  DATE OF DISCHARGE:                                HISTORY & PHYSICAL   CHIEF COMPLAINT:  I want to kill myself.   PRIMARY CARE PHYSICIAN:  Unassigned.   HISTORY OF PRESENT ILLNESS:  The patient is a 50 year old white female who  is brought to the emergency room by EMS after telling her family that she  wanted to kill herself and cutting her wrists.  The patient was found to  have decreasing mental status in the emergency room.  Located in her bed was  a syringe with a bent needle.  It was unclear what medication she was using  at the time.  The patient was given Narcan with no appreciable change in her  level of consciousness.  Her urine drug screen was positive for marijuana  and cocaine.  The patient stated earlier to the mental health worker that  she had argued with her boyfriend and this is the anniversary of her brother  and sister's deaths, both were suicides.  At this time, the patient is not  responding to verbal stimuli, but will awaken to aggressive tactile stimuli  and will follow some commands, but then falls back off to sleep.   REVIEW OF SYSTEMS:  Unable to be fully obtained secondary to the patient's  decreased mental status.  She does, however, state that she is not in pain.   PAST MEDICAL HISTORY:  Cannot be obtained fully from the patient secondary  to decreased mental status.  Review of the old records says that she has an  MRSA infection in her right face and right eye in 2005.  COPD, polysubstance  abuse, bipolar disease, tobacco abuse, depression, and a suicide gesture in  the past.   PAST SURGICAL HISTORY:  Tubal ligation.  She had right thigh surgery  secondary to a needle being  stuck there and right facial abscess that was  operated on.   ALLERGIES:  PENICILLIN which causes swelling, VANCOMYCIN causes itching and  stomach upset.   MEDICATIONS:  Unknown although it states that the emergency room records  that she may have been taking Klonopin.   PHYSICAL EXAMINATION:  VITAL SIGNS:  Temperature is 97.8, blood pressure  121/73, pulse 87, respirations 27, saturations 95% on 2 liters and 86% on  room air.  GENERAL:  This is a disheveled somnolent white female who will follow some  commands, but is generally somnolent.  It takes aggressive stimulation to  cause her to arouse.  She has been noted to have paroxysms of coughing and  has desaturations on room air with sonorous breath sounds.  HEENT:  She is normocephalic and atraumatic.  Pupils equal, round, and  reactive to light, but not able to track.  She is really just staring and  not spontaneously  opening her eyes at this time.  However, she is responsive  to stimuli and she speaks incoherently.  Her mucous membranes are dry.  NECK:  Supple.  CHEST:  Decreased air entry with occasional snoring and occasional rhonchi.  CARDIOVASCULAR:  Regular rate and rhythm.  Positive S1 and S2.  No S3 or S4.  No murmurs, rubs, or gallops.  ABDOMEN:  Soft, nontender, and nondistended with positive bowel sounds.  EXTREMITIES:  Left wrist has a superficial laceration.  There are no tendons  exposed.  The patient can minimally grip, but is not following commands to  do a full range of motion for that hand.  NEUROLOGY:  She will rouse to aggressive stimuli, but is not making sense  when she speaks.  She is following some commands, but will not open her  eyes.  Full neurologic examination cannot be assessed at this time.   LABORATORY DATA:  Sodium 134, potassium 3.7, chloride 105, CO2 24, BUN 7,  creatinine 0.6, glucose 96, white count 7.4, hemoglobin 12.3, hematocrit  37.4 with platelets of 293.  Ethanol is less than 5.  Urine  drug screen is  positive for cocaine and marijuana.  She has no opiates or benzodiazepines.  Her Tylenol and salicylate level is pending.   ASSESSMENT:  1.  This is a 50 year old white female with suicide attempt and altered      level of consciousness secondary to unknown overdose.  The patient slit      her wrist earlier this evening and did take something that has changed      her mental status.  It is unclear what that is.  Neurologically she      rouses, but this is to aggressive stimuli and she is not currently      following commands.  I will check an EKG now, we will check a head CT,      we will do neuro checks q.2 hours and monitor her closely.  2.  Cardiovascular.  She has stable blood pressure and heart rate, but      unable to check an EKG and cardiac markers in light of her cocaine use,      need to rule out myocardial infarction.  3.  Pulmonary.  With a history of chronic obstructive pulmonary disease.      She is having paroxysms of cough with decreased air entry.  We will      check a chest x-ray and consider antibiotics if appropriate and use      nebulizers for COPD.  4.  GI.  She is NPO for now.  Put her on 40 mg of Protonix IV until she is      more awake and alert and able to eat.  5.  Endocrine.  She has no history of diabetes.  Her blood sugar is      currently stable. We will monitor her closely.  6.  Left wrist laceration.  We will cleanse this and put Steri-Strips and      start her on Avelox for soft tissue coverage since she is allergic to      penicillin.  7.  DVT prophylaxis.  We will do with SCDs for now.      Melissa L. Ladona Ridgel, MD  Electronically Signed     MLT/MEDQ  D:  05/17/2005  T:  05/18/2005  Job:  [id number]

## 2010-10-23 ENCOUNTER — Inpatient Hospital Stay (INDEPENDENT_AMBULATORY_CARE_PROVIDER_SITE_OTHER)
Admission: RE | Admit: 2010-10-23 | Discharge: 2010-10-23 | Disposition: A | Discharge status: Home / Self Care | Payer: Self-pay | Source: Ambulatory Visit | Attending: Family Medicine | Admitting: Family Medicine

## 2010-10-23 DIAGNOSIS — W57XXXA Bitten or stung by nonvenomous insect and other nonvenomous arthropods, initial encounter: Secondary | ICD-10-CM

## 2010-10-23 DIAGNOSIS — T148 Other injury of unspecified body region: Secondary | ICD-10-CM

## 2010-12-31 LAB — POCT URINALYSIS DIP (DEVICE)
Bilirubin Urine: NEGATIVE
Hgb urine dipstick: NEGATIVE
Ketones, ur: NEGATIVE mg/dL
Protein, ur: NEGATIVE mg/dL
Specific Gravity, Urine: 1.015 (ref 1.005–1.030)
pH: 7.5 (ref 5.0–8.0)

## 2011-01-31 ENCOUNTER — Emergency Department (HOSPITAL_COMMUNITY)
Admission: EM | Admit: 2011-01-31 | Discharge: 2011-01-31 | Discharge status: Absent without leave | Payer: Self-pay | Source: Home / Self Care

## 2011-01-31 NOTE — ED Notes (Signed)
Pt. Called x2

## 2011-01-31 NOTE — ED Notes (Signed)
Pt called x1

## 2011-01-31 NOTE — ED Notes (Signed)
Spoke with pt outside in waiting room. Pt. Is oriented x3.  Pt is able to speak in full sentences, and breathing without distress.  Pt describes back pain that has been happening for the pass four days. Location of the back pain is towards the center of the back. Pt also states she was vomited and has loose stool for the past 2 days. I placed pt on a wheel chair. I spoke with RN KNT about pt's back pain and we'll continuing to monitor pt. In waiting room for any changes.

## 2011-02-01 ENCOUNTER — Emergency Department (HOSPITAL_COMMUNITY)
Admission: EM | Admit: 2011-02-01 | Discharge: 2011-02-02 | Discharge status: Against Medical Advice | Payer: Self-pay | Attending: Emergency Medicine | Admitting: Emergency Medicine

## 2011-02-01 ENCOUNTER — Encounter: Payer: Self-pay | Admitting: Physical Medicine and Rehabilitation

## 2011-02-01 DIAGNOSIS — M549 Dorsalgia, unspecified: Secondary | ICD-10-CM | POA: Insufficient documentation

## 2011-02-01 HISTORY — DX: Palpitations: R00.2

## 2011-02-01 HISTORY — DX: Anxiety disorder, unspecified: F41.9

## 2011-02-01 HISTORY — DX: Chronic obstructive pulmonary disease, unspecified: J44.9

## 2011-02-01 HISTORY — DX: Unspecified osteoarthritis, unspecified site: M19.90

## 2011-02-01 NOTE — ED Notes (Signed)
Pt presents to department for evaluation of cough and chest congestion x6 days. Respirations unlabored. Skin warm and dry. No signs of distress upon arrival to ED.

## 2011-02-01 NOTE — ED Notes (Signed)
The pt has had back pain for  5-6 days. And she has a lump in her  Rt breast

## 2011-08-24 ENCOUNTER — Emergency Department (HOSPITAL_COMMUNITY)
Admission: EM | Admit: 2011-08-24 | Discharge: 2011-08-24 | Disposition: A | Discharge status: Home / Self Care | Payer: Self-pay | Attending: Emergency Medicine | Admitting: Emergency Medicine

## 2011-08-24 ENCOUNTER — Encounter (HOSPITAL_COMMUNITY): Payer: Self-pay | Admitting: Emergency Medicine

## 2011-08-24 DIAGNOSIS — Z79899 Other long term (current) drug therapy: Secondary | ICD-10-CM | POA: Insufficient documentation

## 2011-08-24 DIAGNOSIS — Y9389 Activity, other specified: Secondary | ICD-10-CM | POA: Insufficient documentation

## 2011-08-24 DIAGNOSIS — J4489 Other specified chronic obstructive pulmonary disease: Secondary | ICD-10-CM | POA: Insufficient documentation

## 2011-08-24 DIAGNOSIS — M1990 Unspecified osteoarthritis, unspecified site: Secondary | ICD-10-CM | POA: Insufficient documentation

## 2011-08-24 DIAGNOSIS — S3992XA Unspecified injury of lower back, initial encounter: Secondary | ICD-10-CM

## 2011-08-24 DIAGNOSIS — IMO0002 Reserved for concepts with insufficient information to code with codable children: Secondary | ICD-10-CM | POA: Insufficient documentation

## 2011-08-24 DIAGNOSIS — Y998 Other external cause status: Secondary | ICD-10-CM | POA: Insufficient documentation

## 2011-08-24 DIAGNOSIS — F17200 Nicotine dependence, unspecified, uncomplicated: Secondary | ICD-10-CM | POA: Insufficient documentation

## 2011-08-24 DIAGNOSIS — J449 Chronic obstructive pulmonary disease, unspecified: Secondary | ICD-10-CM | POA: Insufficient documentation

## 2011-08-24 DIAGNOSIS — X500XXA Overexertion from strenuous movement or load, initial encounter: Secondary | ICD-10-CM | POA: Insufficient documentation

## 2011-08-24 MED ORDER — OXYCODONE-ACETAMINOPHEN 5-325 MG PO TABS: 1.0000 | ORAL_TABLET | ORAL | Status: AC | PRN

## 2011-08-24 MED ORDER — PREDNISONE 20 MG PO TABS: 20.0000 mg | ORAL_TABLET | Freq: Two times a day (BID) | ORAL | Status: AC

## 2011-08-24 NOTE — ED Notes (Signed)
Pt reports trying to pull down garage door last night and felt a pop in her back. Pt c/o back pain. Denies numbness in legs or loss of bowel or bladder function.

## 2011-08-24 NOTE — ED Provider Notes (Signed)
History   This chart was scribed for Flint Melter, MD by Brooks Sailors. The patient was seen in room STRE5/STRE5. Patient's care was started at 1310.   CSN: [id number]  Arrival date & time 08/24/11  1310   First MD Initiated Contact with Patient 08/24/11 1357      Chief Complaint  Patient presents with  . Back Pain    (Consider location/radiation/quality/duration/timing/severity/associated sxs/prior treatment) HPI [name] is a 51 y.o. female who presents to the Emergency Department complaining of back pain onset last night and persistent since. Patient attempted to pull down garage door and hurt back after feeling a "pop". Pt denies falling on ground after pulling down garage door. Pt took a "BC" for her back pain. Patient has DDD, denies any back surgery. Denies fever. Patient has some tingling in the right extremity.   Past Medical History  Diagnosis Date  . Arthritis   . COPD (chronic obstructive pulmonary disease)   . Anxiety   . Palpitations     History reviewed. No pertinent past surgical history.  History reviewed. No pertinent family history.  History  Substance Use Topics  . Smoking status: Current Everyday Smoker  . Smokeless tobacco: Not on file  . Alcohol Use: No    OB History    Grav Para Term Preterm Abortions TAB SAB Ect Mult Living                  Review of Systems  All other systems reviewed and are negative.    Allergies  Penicillins  Home Medications   Current Outpatient Rx  Name Route Sig Dispense Refill  . ALBUTEROL SULFATE HFA 108 (90 BASE) MCG/ACT IN AERS Inhalation Inhale 1 puff into the lungs every 6 (six) hours as needed. For shortness of breath    . CLONAZEPAM 0.5 MG PO TABS Oral Take 0.5 mg by mouth 4 (four) times daily as needed. For anxiety     . LAMOTRIGINE 100 MG PO TABS Oral Take 200 mg by mouth every evening.     Marland Kitchen QUETIAPINE FUMARATE ER 300 MG PO TB24 Oral Take 300 mg by mouth at bedtime as needed. For sleep      . OXYCODONE-ACETAMINOPHEN 5-325 MG PO TABS Oral Take 1 tablet by mouth every 4 (four) hours as needed for pain. 15 tablet 0  . PREDNISONE 20 MG PO TABS Oral Take 1 tablet (20 mg total) by mouth 2 (two) times daily. 10 tablet 0    BP 137/80  Pulse 92  Temp(Src) 97.7 F (36.5 C) (Oral)  Resp 16  SpO2 91%  Physical Exam  Nursing note and vitals reviewed. Constitutional: She is oriented to person, place, and time. She appears well-developed and well-nourished.  HENT:  Head: Normocephalic and atraumatic.  Eyes: Conjunctivae and EOM are normal. Pupils are equal, round, and reactive to light.  Neck: Normal range of motion and phonation normal. Neck supple.  Cardiovascular: Normal rate, regular rhythm and intact distal pulses.   Pulmonary/Chest: She has no wheezes.       Cough on expiration. Scattered rhonchi, no wheezes.   Abdominal: Soft. She exhibits no distension. There is no tenderness. There is no guarding.  Musculoskeletal: Normal range of motion. She exhibits tenderness.       Moderate lumbar spine tenderness. Right para vertebral lumbar tenderness.   Neurological: She is alert and oriented to person, place, and time. She has normal strength. She exhibits normal muscle tone.  Decrease light touch sensation to right anterior thigh.   Skin: Skin is warm and dry.  Psychiatric: She has a normal mood and affect. Her behavior is normal. Judgment and thought content normal.    ED Course  Procedures (including critical care time)  1402 Pt seen and agrees with course of care.   Labs Reviewed - No data to display No results found.   1. Back injuries   2. Degenerative joint disease       MDM  Back injury without fracture. Patient stable for discharge    Plan: Home Medications- Percocet; Home Treatments- rest, ice, heat; Recommended follow up- Ortho prn  I personally performed the services described in this documentation, which was scribed in my presence. The recorded  information has been reviewed and considered.     Flint Melter, MD 08/24/11 2056

## 2011-08-24 NOTE — Discharge Instructions (Signed)
Use ice on the sore area 3 times a day for 2 days, then heat.  See the Dr. of your choice as needed for problems.   Degenerative Arthritis You have osteoarthritis. This is the wear and tear arthritis that comes with aging. It is also called degenerative arthritis. This is common in people past middle age. It is caused by stress on the joints. The large weight bearing joints of the lower extremities are most often affected. The knees, hips, back, neck, and hands can become painful, swollen, and stiff. This is the most common type of arthritis. It comes on with age, carrying too much weight, or from an injury. Treatment includes resting the sore joint until the pain and swelling improve. Crutches or a walker may be needed for severe flares. Only take over-the-counter or prescription medicines for pain, discomfort, or fever as directed by your caregiver. Local heat therapy may improve motion. Cortisone shots into the joint are sometimes used to reduce pain and swelling during flares. Osteoarthritis is usually not crippling and progresses slowly. There are things you can do to decrease pain:  Avoid high impact activities.   Exercise regularly.   Low impact exercises such as walking, biking and swimming help to keep the muscles strong and keep normal joint function.   Stretching helps to keep your range of motion.   Lose weight if you are overweight. This reduces joint stress.  In severe cases when you have pain at rest or increasing disability, joint surgery may be helpful. See your caregiver for follow-up treatment as recommended.  SEEK IMMEDIATE MEDICAL CARE IF:   You have severe joint pain.   Marked swelling and redness in your joint develops.   You develop a high fever.  Document Released: 03/14/2005 Document Revised: 03/03/2011 Document Reviewed: 08/14/2006 [facility] Patient Information 2012 [place], [place].Back Exercises Back exercises help treat and prevent back injuries. The goal is to  increase your strength in your belly (abdominal) and back muscles. These exercises can also help with flexibility. Start these exercises when told by your doctor. HOME CARE Back exercises include: Pelvic Tilt.  Lie on your back with your knees bent. Tilt your pelvis until the lower part of your back is against the floor. Hold this position 5 to 10 sec. Repeat this exercise 5 to 10 times.  Knee to Chest.  Pull 1 knee up against your chest and hold for 20 to 30 seconds. Repeat this with the other knee. This may be done with the other leg straight or bent, whichever feels better. Then, pull both knees up against your chest.  Sit-Ups or Curl-Ups.  Bend your knees 90 degrees. Start with tilting your pelvis, and do a partial, slow sit-up. Only lift your upper half 30 to 45 degrees off the floor. Take at least 2 to 3 seonds for each sit-up. Do not do sit-ups with your knees out straight. If partial sit-ups are difficult, simply do the above but with only tightening your belly (abdominal) muscles and holding it as told.  Hip-Lift.  Lie on your back with your knees flexed 90 degrees. Push down with your feet and shoulders as you raise your hips 2 inches off the floor. Hold for 10 seconds, repeat 5 to 10 times.  Back Arches.  Lie on your stomach. Prop yourself up on bent elbows. Slowly press on your hands, causing an arch in your low back. Repeat 3 to 5 times.  Shoulder-Lifts.  Lie face down with arms beside your body. Keep  hips and belly pressed to floor as you slowly lift your head and shoulders off the floor.  Do not overdo your exercises. Be careful in the beginning. Exercises may cause you some mild back discomfort. If the pain lasts for more than 15 minutes, stop the exercises until you see your doctor. Improvement with exercise for back problems is slow.  Document Released: 04/16/2010 Document Revised: 03/03/2011 Document Reviewed: 04/16/2010 [facility] Patient Information 2012 [place],  [place].  RESOURCE GUIDE  Dental Problems  Patients with Medicaid: [facility] [phone]                                           [phone] [address] Phone:  [phone]                                                  Phone:  [phone]  If unable to pay or uninsured, contact:  [facility] or [facility]. to become qualified for the adult dental clinic.  Chronic Pain Problems Contact [facility]  [phone] Patients need to be referred by their primary care doctor.  Insufficient Money for Medicine Contact [employer]:  call "211" or [facility] 571-642-7285.  No Primary Care Doctor Call [facility]  [phone] Other agencies that provide inexpensive medical care    [facility]  [phone]    [facility]  [phone]    [facility]  [phone]    [facility]  [phone]    [facility]  [phone]    [facility]  [phone]  [facility] Behavioral Health  [phone] [facility] Services  [phone] [facility] Mental Health   [phone] (emergency services [phone])  Substance Abuse Resources [facility]  [phone] [facility] [phone] [place] [phone] Floydene Flock [phone] Residential & Outpatient Substance Abuse Program  [phone]  Abuse/Neglect [facility] Child Abuse Hotline 715-670-1942 [facility] Child Abuse Hotline 770-453-3400 (After Hours)  Emergency Shelter [facility] [phone]  Maternity Homes [facility] [place] [facility] [place] 8577986914 [facility] Services [phone]  MRSA Hotline #:   (952)315-0155    [facility] Resources  [facility]     [employer]                          [place] [address]                        [address]      [address]  Patrecia Pace  [facility] Phone:  [phone]                                   Phone:  [phone]                 Phone:  [phone]  [facility] Mental Health Phone:  [phone]  The [facility] Child Abuse Hotline 7038883542 [phone] (After Hours)

## 2011-12-19 ENCOUNTER — Emergency Department (HOSPITAL_COMMUNITY): Payer: Self-pay

## 2011-12-19 ENCOUNTER — Encounter (HOSPITAL_COMMUNITY): Payer: Self-pay | Admitting: *Deleted

## 2011-12-19 ENCOUNTER — Emergency Department (HOSPITAL_COMMUNITY)
Admission: EM | Admit: 2011-12-19 | Discharge: 2011-12-19 | Disposition: A | Discharge status: Home / Self Care | Payer: Self-pay | Attending: Emergency Medicine | Admitting: Emergency Medicine

## 2011-12-19 DIAGNOSIS — R112 Nausea with vomiting, unspecified: Secondary | ICD-10-CM | POA: Insufficient documentation

## 2011-12-19 DIAGNOSIS — R0789 Other chest pain: Secondary | ICD-10-CM

## 2011-12-19 DIAGNOSIS — K8020 Calculus of gallbladder without cholecystitis without obstruction: Secondary | ICD-10-CM | POA: Insufficient documentation

## 2011-12-19 DIAGNOSIS — J4489 Other specified chronic obstructive pulmonary disease: Secondary | ICD-10-CM | POA: Insufficient documentation

## 2011-12-19 DIAGNOSIS — J449 Chronic obstructive pulmonary disease, unspecified: Secondary | ICD-10-CM | POA: Insufficient documentation

## 2011-12-19 DIAGNOSIS — F411 Generalized anxiety disorder: Secondary | ICD-10-CM | POA: Insufficient documentation

## 2011-12-19 DIAGNOSIS — J40 Bronchitis, not specified as acute or chronic: Secondary | ICD-10-CM | POA: Insufficient documentation

## 2011-12-19 DIAGNOSIS — F17200 Nicotine dependence, unspecified, uncomplicated: Secondary | ICD-10-CM | POA: Insufficient documentation

## 2011-12-19 DIAGNOSIS — R079 Chest pain, unspecified: Secondary | ICD-10-CM | POA: Insufficient documentation

## 2011-12-19 DIAGNOSIS — M129 Arthropathy, unspecified: Secondary | ICD-10-CM | POA: Insufficient documentation

## 2011-12-19 DIAGNOSIS — R002 Palpitations: Secondary | ICD-10-CM | POA: Insufficient documentation

## 2011-12-19 LAB — CBC WITH DIFFERENTIAL/PLATELET
HCT: 46.1 % — ABNORMAL HIGH (ref 36.0–46.0)
Hemoglobin: 15.4 g/dL — ABNORMAL HIGH (ref 12.0–15.0)
Lymphs Abs: 3.3 10*3/uL (ref 0.7–4.0)
MCH: 30.3 pg (ref 26.0–34.0)
MCHC: 33.4 g/dL (ref 30.0–36.0)
Monocytes Absolute: 0.9 10*3/uL (ref 0.1–1.0)
Monocytes Relative: 9 % (ref 3–12)
Neutro Abs: 5.6 10*3/uL (ref 1.7–7.7)
Neutrophils Relative %: 54 % (ref 43–77)
RBC: 5.08 MIL/uL (ref 3.87–5.11)

## 2011-12-19 LAB — URINALYSIS, ROUTINE W REFLEX MICROSCOPIC
Bilirubin Urine: NEGATIVE
Glucose, UA: NEGATIVE mg/dL
Ketones, ur: NEGATIVE mg/dL
Leukocytes, UA: NEGATIVE
Nitrite: NEGATIVE
Specific Gravity, Urine: 1.004 — ABNORMAL LOW (ref 1.005–1.030)
pH: 8 (ref 5.0–8.0)

## 2011-12-19 LAB — COMPREHENSIVE METABOLIC PANEL
Alkaline Phosphatase: 113 U/L (ref 39–117)
BUN: 5 mg/dL — ABNORMAL LOW (ref 6–23)
Chloride: 102 mEq/L (ref 96–112)
Creatinine, Ser: 0.56 mg/dL (ref 0.50–1.10)
GFR calc Af Amer: >90 mL/min (ref >90)
GFR calc non Af Amer: >90 mL/min (ref >90)
Glucose, Bld: 105 mg/dL — ABNORMAL HIGH (ref 70–99)
Potassium: 4 mEq/L (ref 3.5–5.1)
Total Bilirubin: 0.2 mg/dL — ABNORMAL LOW (ref 0.3–1.2)

## 2011-12-19 LAB — LIPASE, BLOOD: Lipase: 33 U/L (ref 11–59)

## 2011-12-19 LAB — TROPONIN I: Troponin I: <0.3 ng/mL (ref <0.30)

## 2011-12-19 MED ORDER — METHYLPREDNISOLONE SODIUM SUCC 125 MG IJ SOLR
INTRAMUSCULAR | Status: AC
  Filled 2011-12-19: qty 2

## 2011-12-19 MED ORDER — ALBUTEROL SULFATE (5 MG/ML) 0.5% IN NEBU
2.5000 mg | INHALATION_SOLUTION | Freq: Once | RESPIRATORY_TRACT | Status: AC
  Administered 2011-12-19: 2.5 mg via RESPIRATORY_TRACT
  Filled 2011-12-19: qty 0.5

## 2011-12-19 MED ORDER — OMEPRAZOLE 20 MG PO CPDR: 20.0000 mg | DELAYED_RELEASE_CAPSULE | Freq: Every day | ORAL | Status: DC

## 2011-12-19 MED ORDER — HYDROCODONE-ACETAMINOPHEN 5-500 MG PO TABS: 1.0000 | ORAL_TABLET | Freq: Four times a day (QID) | ORAL | Status: DC | PRN

## 2011-12-19 MED ORDER — PREDNISONE 10 MG PO TABS: ORAL_TABLET | ORAL | Status: DC

## 2011-12-19 MED ORDER — PROMETHAZINE HCL 25 MG PO TABS: 25.0000 mg | ORAL_TABLET | Freq: Four times a day (QID) | ORAL | Status: DC | PRN

## 2011-12-19 MED ORDER — ONDANSETRON HCL 4 MG/2ML IJ SOLN
4.0000 mg | Freq: Once | INTRAMUSCULAR | Status: AC
  Administered 2011-12-19: 4 mg via INTRAVENOUS
  Filled 2011-12-19: qty 2

## 2011-12-19 MED ORDER — ALBUTEROL SULFATE HFA 108 (90 BASE) MCG/ACT IN AERS
2.0000 | INHALATION_SPRAY | Freq: Once | RESPIRATORY_TRACT | Status: AC
  Administered 2011-12-19: 2 via RESPIRATORY_TRACT
  Filled 2011-12-19: qty 6.7

## 2011-12-19 MED ORDER — BENZONATATE 200 MG PO CAPS: 200.0000 mg | ORAL_CAPSULE | Freq: Three times a day (TID) | ORAL | Status: DC | PRN

## 2011-12-19 MED ORDER — METHYLPREDNISOLONE SODIUM SUCC 125 MG IJ SOLR
125.0000 mg | Freq: Once | INTRAMUSCULAR | Status: AC
  Administered 2011-12-19: 125 mg via INTRAVENOUS

## 2011-12-19 MED ORDER — MORPHINE SULFATE 4 MG/ML IJ SOLN
6.0000 mg | Freq: Once | INTRAMUSCULAR | Status: AC
  Administered 2011-12-19: 6 mg via INTRAVENOUS
  Filled 2011-12-19 (×2): qty 1

## 2011-12-19 NOTE — ED Notes (Signed)
The pt has had epigastric pain for 4 days that radiates through to her rt posterior chest.  She is also c/o a headache .  She has a cold

## 2011-12-19 NOTE — ED Notes (Signed)
Pt remains in US

## 2011-12-19 NOTE — ED Notes (Signed)
PT ambulated with baseline gait; VSS; A&Ox3; no signs of distress; respirations even and unlabored; skin warm and dry; no questions upon discharge.  

## 2011-12-19 NOTE — ED Provider Notes (Signed)
History     CSN: [id number]  Arrival date & time 12/19/11  1453   First MD Initiated Contact with Patient 12/19/11 1934      Chief Complaint  Patient presents with  . Abdominal Pain    (Consider location/radiation/quality/duration/timing/severity/associated sxs/prior treatment) HPI Comments: [name] is a 51 y.o. Female who presents with complaint of abdominal pain, nausea, vomiting, right chest wall pain, cough, congestion. States cough has been there "for weeks" chest pain since  Yesterday. States abdominal pain, nausea, vomiting for 3 days. States saw some blood in her emesis, and has told Dr. Fonnie Jarvis, that her emesis and stool were both black. Pt states she has been doing neb treatments at home for her cough with no improvement. Last neb yesterday.  Pt denies fever, chills. Admits to some pain with urination. Chest pain is pleuritic and worsened with coughing. Denies SOB.    Past Medical History  Diagnosis Date  . Arthritis   . COPD (chronic obstructive pulmonary disease)   . Anxiety   . Palpitations     History reviewed. No pertinent past surgical history.  No family history on file.  History  Substance Use Topics  . Smoking status: Current Every Day Smoker  . Smokeless tobacco: Not on file  . Alcohol Use: No    OB History    Grav Para Term Preterm Abortions TAB SAB Ect Mult Living                  Review of Systems  Constitutional: Negative for fever and chills.  HENT: Negative for neck pain and neck stiffness.   Respiratory: Positive for cough and wheezing. Negative for shortness of breath.   Cardiovascular: Positive for chest pain. Negative for palpitations and leg swelling.  Gastrointestinal: Positive for nausea, vomiting, abdominal pain, diarrhea and blood in stool.  Genitourinary: Positive for dysuria. Negative for urgency, hematuria and flank pain.  Skin: Negative.   Neurological: Positive for dizziness, weakness and headaches.  Hematological: Does  not bruise/bleed easily.    Allergies  Penicillins  Home Medications   Current Outpatient Rx  Name Route Sig Dispense Refill  . CLONAZEPAM 0.5 MG PO TABS Oral Take 0.5 mg by mouth 4 (four) times daily as needed. For anxiety     . LAMOTRIGINE 100 MG PO TABS Oral Take 200 mg by mouth every evening.     Marland Kitchen QUETIAPINE FUMARATE ER 300 MG PO TB24 Oral Take 300 mg by mouth at bedtime as needed. For sleep      BP 168/71  Pulse 80  Temp 98.4 F (36.9 C) (Oral)  Resp 25  SpO2 97%  Physical Exam  Nursing note and vitals reviewed. Constitutional: She is oriented to person, place, and time. She appears well-developed and well-nourished.       Tearful, appears uncomfortable  HENT:  Head: Normocephalic.  Eyes: Conjunctivae normal are normal. No scleral icterus.  Neck: Neck supple.  Cardiovascular: Normal rate, regular rhythm and normal heart sounds.   Pulmonary/Chest: Effort normal. No respiratory distress. She has wheezes. She has no rales. She exhibits tenderness.       expiratory wheezes in all lung fields. Right lateral lower rib cage tenderness. No bruising or swelling.  Abdominal: Soft. Bowel sounds are normal. She exhibits no distension. There is tenderness. There is guarding. There is no rebound.       RUQ tenderness, guarding  Musculoskeletal: She exhibits no edema.  Neurological: She is alert and oriented to person, place, and time.  Skin: Skin is warm and dry.    ED Course  Procedures (including critical care time)  Pt with upper abdominal pain, nausea, vomiting, reports some blood in stool and emesis (mainly "black specks."). Also with cough, wheezing, right sided chest pain. Will get labs, ecg, urine, will monitor.   Results for orders placed during the hospital encounter of 12/19/11  URINALYSIS, ROUTINE W REFLEX MICROSCOPIC      Component Value Range   Color, Urine YELLOW  YELLOW   APPearance CLEAR  CLEAR   Specific Gravity, Urine 1.004 (*) 1.005 - 1.030   pH 8.0   5.0 - 8.0   Glucose, UA NEGATIVE  NEGATIVE mg/dL   Hgb urine dipstick NEGATIVE  NEGATIVE   Bilirubin Urine NEGATIVE  NEGATIVE   Ketones, ur NEGATIVE  NEGATIVE mg/dL   Protein, ur NEGATIVE  NEGATIVE mg/dL   Urobilinogen, UA 1.0  0.0 - 1.0 mg/dL   Nitrite NEGATIVE  NEGATIVE   Leukocytes, UA NEGATIVE  NEGATIVE  CBC WITH DIFFERENTIAL      Component Value Range   WBC 10.3  4.0 - 10.5 K/uL   RBC 5.08  3.87 - 5.11 MIL/uL   Hemoglobin 15.4 (*) 12.0 - 15.0 g/dL   HCT 16.1 (*) 09.6 - 04.5 %   MCV 90.7  78.0 - 100.0 fL   MCH 30.3  26.0 - 34.0 pg   MCHC 33.4  30.0 - 36.0 g/dL   RDW 40.9  81.1 - 91.4 %   Platelets 261  150 - 400 K/uL   Neutrophils Relative 54  43 - 77 %   Neutro Abs 5.6  1.7 - 7.7 K/uL   Lymphocytes Relative 32  12 - 46 %   Lymphs Abs 3.3  0.7 - 4.0 K/uL   Monocytes Relative 9  3 - 12 %   Monocytes Absolute 0.9  0.1 - 1.0 K/uL   Eosinophils Relative 5  0 - 5 %   Eosinophils Absolute 0.5  0.0 - 0.7 K/uL   Basophils Relative 1  0 - 1 %   Basophils Absolute 0.1  0.0 - 0.1 K/uL  COMPREHENSIVE METABOLIC PANEL      Component Value Range   Sodium 139  135 - 145 mEq/L   Potassium 4.0  3.5 - 5.1 mEq/L   Chloride 102  96 - 112 mEq/L   CO2 26  19 - 32 mEq/L   Glucose, Bld 105 (*) 70 - 99 mg/dL   BUN 5 (*) 6 - 23 mg/dL   Creatinine, Ser 7.82  0.50 - 1.10 mg/dL   Calcium 9.6  8.4 - 95.6 mg/dL   Total Protein 7.9  6.0 - 8.3 g/dL   Albumin 3.7  3.5 - 5.2 g/dL   AST 25  0 - 37 U/L   ALT 27  0 - 35 U/L   Alkaline Phosphatase 113  39 - 117 U/L   Total Bilirubin 0.2 (*) 0.3 - 1.2 mg/dL   GFR calc non Af Amer >90  >90 mL/min   GFR calc Af Amer >90  >90 mL/min  TROPONIN I      Component Value Range   Troponin I <0.30  <0.30 ng/mL  LIPASE, BLOOD      Component Value Range   Lipase 33  11 - 59 U/L   Dg Chest 2 View  12/19/2011  *RADIOLOGY REPORT*  Clinical Data: Right sided chest pain.  CHEST - 2 VIEW  Comparison: 05/17/2005  Findings: Two views of the chest demonstrate  clear  lungs. Heart and mediastinum are within normal limits.  Bony thorax is intact. Trachea is midline.  IMPRESSION: No acute cardiopulmonary disease.   Original Report Authenticated By: Richarda Overlie, M.D.    US Abdomen Complete  12/19/2011  *RADIOLOGY REPORT*  Clinical Data:  Abdominal pain.  COMPLETE ABDOMINAL ULTRASOUND  Comparison:  None.  Findings:  Gallbladder:  There is echogenic material near the gallbladder neck which is suggestive for sludge.  No definite stones.  No evidence for gallbladder wall thickening.  No evidence for a sonographic Murphy's sign.  Common bile duct:  Measures 0.4 cm.  Liver:  No focal lesion identified.  Within normal limits in parenchymal echogenicity.  IVC:  Appears normal.  Pancreas:  No gross abnormality but the tail is obscured by bowel gas.  Spleen:  Measures 5.4 cm in length.  Right Kidney:  Measures 10.8 cm in length without hydronephrosis.  Left Kidney:  Left kidney measures 9.7 cm in length without hydronephrosis.  Abdominal aorta:  The mid and distal aorta are not visualized.  IMPRESSION:  Echogenic material within the gallbladder is suggestive for sludge. Negative for biliary dilatation.   Original Report Authenticated By: Richarda Overlie, M.D.     10:00 PM Pt received a neb treatment for her sob, cough, wheezing, pain and nausea medications. Pt feeling better now. She is no longer having pain. CXR negative. US showed gallbladder sludge, negative labs and LFTS/lipase. Abdomen soft now, no guarding. Her VS are normal. Hemoccult negative. Pt is stable for d/c home at this time with outpatient management. Prednisone, nebs at home for bronchitis. Pain meds, PPI for abdominal pain. Antiemetics for nausea. Pt agrees with the plan. Instructed to return if worsening.  1. Cholelithiasis   2. Bronchitis   3. Chest wall pain   4. Nausea vomiting and diarrhea       MDM  Pt with right sided chest wall pain, reproducible on exam. Cough, wheezing, improved with nebs. She is also  having N/V/D, suspect a viral illness, labs negative. Symptoms improved with pain and nausea meds. Her [place] of abdomen showed gall bladder sludge with no signs of cholecystitis. Her hemoccult is negative. VS normal. Pt is stable for d/c home and follow up outpatient. Instructed to return if worsening.         Lottie Mussel, PA 12/20/11 0101

## 2011-12-19 NOTE — ED Provider Notes (Signed)
Medical screening examination/treatment/procedure(s) were conducted as a shared visit with non-physician practitioner(s) and myself.  I personally evaluated the patient during the encounter  This 51 year old female comes in a few days of cough, wheezing, shortness of breath, chest wall type pain which is nonstop for 3 days worse with certain position changes and palpation reproducible on exam, she also has wheezing diffusely on exam, she also has a few days of constant moderately severe upper abdominal pain in the epigastrium and right upper quadrant with multiple episodes per day of diarrhea stools as well as vomiting and she describes some coffee ground emesis flex possibly the vomiting and dark black stools as well with no history of GI bleeds or cirrhosis or liver problems in the past and she states she rarely drinks alcohol. Her epigastrium and right upper quadrant moderately tender without peritonitis.  Hurman Horn, MD 12/20/11 2258

## 2011-12-19 NOTE — ED Notes (Signed)
Pt returned from [place]; hooked back up to monitor; pain under control. Reports coughing less and feeling better.

## 2012-02-28 ENCOUNTER — Encounter (HOSPITAL_COMMUNITY): Payer: Self-pay | Admitting: Emergency Medicine

## 2012-02-28 ENCOUNTER — Emergency Department (HOSPITAL_COMMUNITY)
Admission: EM | Admit: 2012-02-28 | Discharge: 2012-02-28 | Disposition: A | Discharge status: Home / Self Care | Payer: Self-pay | Attending: Emergency Medicine | Admitting: Emergency Medicine

## 2012-02-28 DIAGNOSIS — N750 Cyst of Bartholin's gland: Secondary | ICD-10-CM | POA: Insufficient documentation

## 2012-02-28 DIAGNOSIS — R112 Nausea with vomiting, unspecified: Secondary | ICD-10-CM | POA: Insufficient documentation

## 2012-02-28 DIAGNOSIS — F411 Generalized anxiety disorder: Secondary | ICD-10-CM | POA: Insufficient documentation

## 2012-02-28 DIAGNOSIS — Z8679 Personal history of other diseases of the circulatory system: Secondary | ICD-10-CM | POA: Insufficient documentation

## 2012-02-28 DIAGNOSIS — Z8739 Personal history of other diseases of the musculoskeletal system and connective tissue: Secondary | ICD-10-CM | POA: Insufficient documentation

## 2012-02-28 DIAGNOSIS — J449 Chronic obstructive pulmonary disease, unspecified: Secondary | ICD-10-CM | POA: Insufficient documentation

## 2012-02-28 DIAGNOSIS — F17200 Nicotine dependence, unspecified, uncomplicated: Secondary | ICD-10-CM | POA: Insufficient documentation

## 2012-02-28 DIAGNOSIS — J4489 Other specified chronic obstructive pulmonary disease: Secondary | ICD-10-CM | POA: Insufficient documentation

## 2012-02-28 DIAGNOSIS — Z79899 Other long term (current) drug therapy: Secondary | ICD-10-CM | POA: Insufficient documentation

## 2012-02-28 MED ORDER — ONDANSETRON 4 MG PO TBDP
4.0000 mg | ORAL_TABLET | Freq: Once | ORAL | Status: AC
  Administered 2012-02-28: 4 mg via ORAL
  Filled 2012-02-28: qty 1

## 2012-02-28 MED ORDER — HYDROMORPHONE HCL PF 1 MG/ML IJ SOLN
1.0000 mg | Freq: Once | INTRAMUSCULAR | Status: AC
  Administered 2012-02-28: 1 mg via INTRAMUSCULAR
  Filled 2012-02-28: qty 1

## 2012-02-28 MED ORDER — HYDROCODONE-ACETAMINOPHEN 5-325 MG PO TABS: 1.0000 | ORAL_TABLET | Freq: Four times a day (QID) | ORAL | Status: DC | PRN

## 2012-02-28 NOTE — ED Notes (Signed)
Pt c/o labial abscess x 3 days

## 2012-02-28 NOTE — ED Notes (Signed)
WORD Catheter handed to Jeraldine Loots, MD

## 2012-02-28 NOTE — ED Provider Notes (Signed)
History    This chart was scribed for Gerhard Munch, MD, MD by Smitty Pluck, ED Scribe. The patient was seen in room TR11C and the patient's care was started at 11:34AM.   CSN: [id number]  Arrival date & time 02/28/12  1028      Chief Complaint  Patient presents with  . Abscess    (Consider location/radiation/quality/duration/timing/severity/associated sxs/prior treatment) The history is provided by the patient. No language interpreter was used.   [name] is a 51 y.o. female with hx of COPD who presents to the Emergency Department complaining of right labial abscess causing constant, moderate pain for 3 days ago. She reports that she has tried warm compresses and sanitary wipes after urination without relief. She reports that she has vomiting and nausea. She denies fever, chills, abdominal pain, cough and any other pain.   Past Medical History  Diagnosis Date  . Arthritis   . COPD (chronic obstructive pulmonary disease)   . Anxiety   . Palpitations     History reviewed. No pertinent past surgical history.  History reviewed. No pertinent family history.  History  Substance Use Topics  . Smoking status: Current Every Day Smoker  . Smokeless tobacco: Not on file  . Alcohol Use: No    OB History    Grav Para Term Preterm Abortions TAB SAB Ect Mult Living                  Review of Systems  Constitutional:       Per HPI, otherwise negative  HENT:       Per HPI, otherwise negative  Eyes: Negative.   Respiratory:       Per HPI, otherwise negative  Cardiovascular:       Per HPI, otherwise negative  Gastrointestinal: Negative for vomiting.  Genitourinary: Negative.   Musculoskeletal:       Per HPI, otherwise negative  Skin: Negative.   Neurological: Negative for syncope.    Allergies  Penicillins  Home Medications   Current Outpatient Rx  Name  Route  Sig  Dispense  Refill  . CLONAZEPAM 0.5 MG PO TABS   Oral   Take 0.5 mg by mouth 4 (four) times  daily as needed. For anxiety          . LAMOTRIGINE 100 MG PO TABS   Oral   Take 200 mg by mouth every evening.          Marland Kitchen OMEPRAZOLE 20 MG PO CPDR   Oral   Take 1 capsule (20 mg total) by mouth daily.   20 capsule   0   . QUETIAPINE FUMARATE ER 300 MG PO TB24   Oral   Take 300 mg by mouth at bedtime. For sleep           BP 150/89  Pulse 94  Temp 98 F (36.7 C) (Oral)  Resp 18  SpO2 95%  Physical Exam  Nursing note and vitals reviewed. Constitutional: She is oriented to person, place, and time. She appears well-developed and well-nourished. No distress.  HENT:  Head: Normocephalic and atraumatic.  Eyes: EOM are normal.  Neck: Neck supple. No tracheal deviation present.  Cardiovascular: Normal rate.   Pulmonary/Chest: Effort normal. No respiratory distress.  Genitourinary:       Indurated, deep, 3x2 cm cyst extending into the vaginal vault 6 cm Tender Branching   Musculoskeletal: Normal range of motion.  Neurological: She is alert and oriented to person, place, and time.  Skin:  Skin is warm and dry.  Psychiatric: She has a normal mood and affect. Her behavior is normal.    ED Course  Procedures (including critical care time) DIAGNOSTIC STUDIES:   COORDINATION OF CARE: 11:37 AM Discussed ED treatment with pt  11:39 AM Ordered:     . [COMPLETED]  HYDROmorphone (DILAUDID) injection  1 mg Intramuscular Once  . [COMPLETED] ondansetron  4 mg Oral Once   INCISION AND DRAINAGE Performed by: Gerhard Munch, MD Consent: Verbal consent obtained. Risks and benefits: risks, benefits and alternatives were discussed  Type: abscess -bartholin's gland cyst  Body area: interior labia   Anesthesia: local infiltration  3 mm Incision was made with a 11 scalpel.  Local anesthetic: lidocaine 2%    Anesthetic total: 4 ml  Complexity: complex  Word catheter inserted w/o complication  Drainage: purulent  Drainage amount: copious   Packing material: 1/4  in iodoform gauze  Patient tolerance: Patient tolerated the procedure well with no immediate complications.       Labs Reviewed - No data to display No results found.   No diagnosis found.    MDM  I personally performed the services described in this documentation, which was scribed in my presence. The recorded information has been reviewed and is accurate.  The patient presents with several days of pain in her labia.  On exam she is afebrile, but in discomfort.  The patient's physical exam demonstrates presents of a Bartholin's gland cyst.  She had incision, drainage, with placement of a Word catheter.  She was discharged in stable condition with return precautions, follow up instructions, analgesics.  Gerhard Munch, MD 02/28/12 1330

## 2012-03-01 ENCOUNTER — Emergency Department (HOSPITAL_COMMUNITY)
Admission: EM | Admit: 2012-03-01 | Discharge: 2012-03-01 | Disposition: A | Discharge status: Home / Self Care | Payer: Self-pay | Attending: Emergency Medicine | Admitting: Emergency Medicine

## 2012-03-01 ENCOUNTER — Encounter (HOSPITAL_COMMUNITY): Payer: Self-pay | Admitting: Emergency Medicine

## 2012-03-01 DIAGNOSIS — Z79899 Other long term (current) drug therapy: Secondary | ICD-10-CM | POA: Insufficient documentation

## 2012-03-01 DIAGNOSIS — Z4803 Encounter for change or removal of drains: Secondary | ICD-10-CM

## 2012-03-01 DIAGNOSIS — J4489 Other specified chronic obstructive pulmonary disease: Secondary | ICD-10-CM | POA: Insufficient documentation

## 2012-03-01 DIAGNOSIS — J449 Chronic obstructive pulmonary disease, unspecified: Secondary | ICD-10-CM | POA: Insufficient documentation

## 2012-03-01 DIAGNOSIS — Z4889 Encounter for other specified surgical aftercare: Secondary | ICD-10-CM | POA: Insufficient documentation

## 2012-03-01 DIAGNOSIS — R002 Palpitations: Secondary | ICD-10-CM | POA: Insufficient documentation

## 2012-03-01 DIAGNOSIS — F17200 Nicotine dependence, unspecified, uncomplicated: Secondary | ICD-10-CM | POA: Insufficient documentation

## 2012-03-01 DIAGNOSIS — M129 Arthropathy, unspecified: Secondary | ICD-10-CM | POA: Insufficient documentation

## 2012-03-01 DIAGNOSIS — F411 Generalized anxiety disorder: Secondary | ICD-10-CM | POA: Insufficient documentation

## 2012-03-01 NOTE — ED Provider Notes (Signed)
History     CSN: [id number]  Arrival date & time 03/01/12  0802   First MD Initiated Contact with Patient 03/01/12 [phone]      Chief Complaint  Patient presents with  . Cyst    vaginal - patient needs drain removed.    (Consider location/radiation/quality/duration/timing/severity/associated sxs/prior treatment) HPI Comments: This 51 year old female presents emergency department for removal of her Word catheter placed 2 days ago in the emergency department.  Patient denies any complications with the drain and states that there has been decreased drainage since the first day.  She denies fever, night sweats, chills or pain to the area.  The history is provided by the patient.    Past Medical History  Diagnosis Date  . Arthritis   . COPD (chronic obstructive pulmonary disease)   . Anxiety   . Palpitations     No past surgical history on file.  No family history on file.  History  Substance Use Topics  . Smoking status: Current Every Day Smoker -- 0.5 packs/day  . Smokeless tobacco: Not on file  . Alcohol Use: No    OB History    Grav Para Term Preterm Abortions TAB SAB Ect Mult Living                  Review of Systems  Constitutional: Negative for fever, diaphoresis and activity change.  HENT: Negative for congestion and neck pain.   Respiratory: Negative for cough.   Genitourinary: Negative for dysuria.  Musculoskeletal: Negative for myalgias.  Skin: Negative for color change and wound.  Neurological: Negative for headaches.  All other systems reviewed and are negative.    Allergies  Penicillins  Home Medications   Current Outpatient Rx  Name  Route  Sig  Dispense  Refill  . CLONAZEPAM 0.5 MG PO TABS   Oral   Take 0.5 mg by mouth 4 (four) times daily as needed. For anxiety          . HYDROCODONE-ACETAMINOPHEN 5-325 MG PO TABS   Oral   Take 1 tablet by mouth every 6 (six) hours as needed for pain.   15 tablet   0   . LAMOTRIGINE 100 MG PO  TABS   Oral   Take 200 mg by mouth every evening.          Marland Kitchen OMEPRAZOLE 20 MG PO CPDR   Oral   Take 1 capsule (20 mg total) by mouth daily.   20 capsule   0   . QUETIAPINE FUMARATE ER 300 MG PO TB24   Oral   Take 300 mg by mouth at bedtime. For sleep           BP 146/91  Pulse 86  Temp 97.1 F (36.2 C) (Oral)  Resp 20  Ht 4\' 11"  (1.499 m)  Wt 148 lb (67.132 kg)  BMI 29.89 kg/m2  SpO2 96%  Physical Exam  Nursing note and vitals reviewed. Constitutional: She is oriented to person, place, and time. She appears well-developed and well-nourished. No distress.  HENT:  Head: Normocephalic and atraumatic.  Eyes: Conjunctivae normal and EOM are normal.  Neck: Normal range of motion.  Pulmonary/Chest: Effort normal.  Genitourinary:       Word catheter placed in Bartholin's cyst.  Not actively draining.  No tenderness to palpation.  Musculoskeletal: Normal range of motion.  Neurological: She is alert and oriented to person, place, and time.  Skin: Skin is warm and dry. No rash noted. She is  not diaphoretic.  Psychiatric: She has a normal mood and affect. Her behavior is normal.    ED Course  Word Cath REMOVAL Date/Time: 03/01/2012 9:00 AM Performed by: Jaci Carrel Authorized by: Jaci Carrel Consent: Verbal consent obtained. Risks and benefits: risks, benefits and alternatives were discussed Consent given by: patient Patient understanding: patient states understanding of the procedure being performed Patient consent: the patient's understanding of the procedure matches consent given Patient identity confirmed: verbally with patient and arm band Body area: vagina (Bartholian cyst ) Anesthesia method: none used. Patient sedated: no Patient restrained: no Patient cooperative: yes Removal mechanism: digital extraction Complexity: simple 1 objects recovered. Objects recovered: word catheter Post-procedure assessment: foreign body removed Patient tolerance: Patient  tolerated the procedure well with no immediate complications.   (including critical care time)  Labs Reviewed - No data to display No results found.   No diagnosis found.    MDM  51 year old patient with Bartholin's cyst placed on 02/28/12 presented to the emergency department today for Word catheter removal per instructions of previous provider.  Drain removed without complications and is not actively draining.  Patient is afebrile and nontoxic appearing in no acute distress.  Patient advised to continue warm soaks at home and followup with OB/GYN.  Patient states understanding and is agreeable with plan.  No other concerns at this time.        Jaci Carrel, [place] 03/01/12 [phone]

## 2012-03-01 NOTE — ED Notes (Signed)
Patient claims came in to get drain removed from where she had a vaginal cyst.  Patient claims no problems.  Patient denies any pain.

## 2012-03-01 NOTE — ED Provider Notes (Signed)
Medical screening examination/treatment/procedure(s) were performed by non-physician practitioner and as supervising physician I was immediately available for consultation/collaboration.   Maddie Brazier III, MD 03/01/12 1953 

## 2012-12-07 ENCOUNTER — Emergency Department (HOSPITAL_COMMUNITY)
Admission: EM | Admit: 2012-12-07 | Discharge: 2012-12-07 | Disposition: A | Discharge status: Home / Self Care | Payer: Self-pay | Attending: Emergency Medicine | Admitting: Emergency Medicine

## 2012-12-07 ENCOUNTER — Emergency Department (HOSPITAL_COMMUNITY): Payer: Self-pay

## 2012-12-07 ENCOUNTER — Encounter (HOSPITAL_COMMUNITY): Payer: Self-pay

## 2012-12-07 DIAGNOSIS — Z8739 Personal history of other diseases of the musculoskeletal system and connective tissue: Secondary | ICD-10-CM | POA: Insufficient documentation

## 2012-12-07 DIAGNOSIS — F411 Generalized anxiety disorder: Secondary | ICD-10-CM | POA: Insufficient documentation

## 2012-12-07 DIAGNOSIS — R05 Cough: Secondary | ICD-10-CM | POA: Insufficient documentation

## 2012-12-07 DIAGNOSIS — J4489 Other specified chronic obstructive pulmonary disease: Secondary | ICD-10-CM | POA: Insufficient documentation

## 2012-12-07 DIAGNOSIS — Z87891 Personal history of nicotine dependence: Secondary | ICD-10-CM | POA: Insufficient documentation

## 2012-12-07 DIAGNOSIS — R059 Cough, unspecified: Secondary | ICD-10-CM | POA: Insufficient documentation

## 2012-12-07 DIAGNOSIS — R112 Nausea with vomiting, unspecified: Secondary | ICD-10-CM | POA: Insufficient documentation

## 2012-12-07 DIAGNOSIS — Z880 Allergy status to penicillin: Secondary | ICD-10-CM | POA: Insufficient documentation

## 2012-12-07 DIAGNOSIS — R109 Unspecified abdominal pain: Secondary | ICD-10-CM

## 2012-12-07 DIAGNOSIS — J449 Chronic obstructive pulmonary disease, unspecified: Secondary | ICD-10-CM | POA: Insufficient documentation

## 2012-12-07 DIAGNOSIS — Z79899 Other long term (current) drug therapy: Secondary | ICD-10-CM | POA: Insufficient documentation

## 2012-12-07 DIAGNOSIS — R1011 Right upper quadrant pain: Secondary | ICD-10-CM | POA: Insufficient documentation

## 2012-12-07 DIAGNOSIS — Z8719 Personal history of other diseases of the digestive system: Secondary | ICD-10-CM | POA: Insufficient documentation

## 2012-12-07 HISTORY — DX: Constipation, unspecified: K59.00

## 2012-12-07 LAB — COMPREHENSIVE METABOLIC PANEL
Albumin: 4.4 g/dL (ref 3.5–5.2)
Alkaline Phosphatase: 99 U/L (ref 39–117)
BUN: 7 mg/dL (ref 6–23)
CO2: 26 mEq/L (ref 19–32)
Chloride: 101 mEq/L (ref 96–112)
Creatinine, Ser: 0.54 mg/dL (ref 0.50–1.10)
GFR calc Af Amer: >90 mL/min (ref >90)
GFR calc non Af Amer: >90 mL/min (ref >90)
Glucose, Bld: 96 mg/dL (ref 70–99)
Potassium: 4 mEq/L (ref 3.5–5.1)
Total Bilirubin: 0.5 mg/dL (ref 0.3–1.2)

## 2012-12-07 LAB — URINALYSIS, ROUTINE W REFLEX MICROSCOPIC
Bilirubin Urine: NEGATIVE
Ketones, ur: NEGATIVE mg/dL
Leukocytes, UA: NEGATIVE
Nitrite: NEGATIVE
Protein, ur: NEGATIVE mg/dL
Urobilinogen, UA: 0.2 mg/dL (ref 0.0–1.0)

## 2012-12-07 LAB — CBC WITH DIFFERENTIAL/PLATELET
Basophils Relative: 0 % (ref 0–1)
HCT: 47.8 % — ABNORMAL HIGH (ref 36.0–46.0)
Hemoglobin: 16.3 g/dL — ABNORMAL HIGH (ref 12.0–15.0)
Lymphs Abs: 3.5 10*3/uL (ref 0.7–4.0)
MCHC: 34.1 g/dL (ref 30.0–36.0)
Monocytes Absolute: 0.7 10*3/uL (ref 0.1–1.0)
Monocytes Relative: 7 % (ref 3–12)
Neutro Abs: 5.9 10*3/uL (ref 1.7–7.7)
RBC: 5.29 MIL/uL — ABNORMAL HIGH (ref 3.87–5.11)

## 2012-12-07 LAB — LIPASE, BLOOD: Lipase: 30 U/L (ref 11–59)

## 2012-12-07 MED ORDER — RANITIDINE HCL 150 MG PO CAPS: 150.0000 mg | ORAL_CAPSULE | Freq: Every day | ORAL | Status: DC

## 2012-12-07 MED ORDER — HYDROMORPHONE HCL PF 1 MG/ML IJ SOLN
1.0000 mg | Freq: Once | INTRAMUSCULAR | Status: AC
  Administered 2012-12-07: 1 mg via INTRAVENOUS
  Filled 2012-12-07: qty 1

## 2012-12-07 MED ORDER — ONDANSETRON HCL 4 MG/2ML IJ SOLN
4.0000 mg | Freq: Once | INTRAMUSCULAR | Status: AC
  Administered 2012-12-07: 4 mg via INTRAVENOUS
  Filled 2012-12-07: qty 2

## 2012-12-07 MED ORDER — PROMETHAZINE HCL 25 MG PO TABS: 25.0000 mg | ORAL_TABLET | Freq: Four times a day (QID) | ORAL | Status: DC | PRN

## 2012-12-07 MED ORDER — TRAMADOL HCL 50 MG PO TABS: 50.0000 mg | ORAL_TABLET | Freq: Four times a day (QID) | ORAL | Status: DC | PRN

## 2012-12-07 MED ORDER — SODIUM CHLORIDE 0.9 % IV BOLUS (SEPSIS)
1000.0000 mL | Freq: Once | INTRAVENOUS | Status: AC
  Administered 2012-12-07: 1000 mL via INTRAVENOUS

## 2012-12-07 NOTE — ED Notes (Signed)
Patient would like something for pain at this time. RN made aware. 

## 2012-12-07 NOTE — ED Notes (Signed)
Pt reports c/o RUQ pain x 2 weeks with n/v.  Pain worse after eating.  Denies diarrhea.  Reports cold symptoms x 4 days.

## 2012-12-07 NOTE — ED Provider Notes (Signed)
CSN: [id number]     Arrival date & time 12/07/12  4098 History  This chart was scribed for Benny Lennert, MD by Quintella Reichert, ED scribe.  This patient was seen in room [facility]12/[facility]12 and the patient's care was started at 10:43 AM.  Chief Complaint  Patient presents with  . Abdominal Pain   Patient is a 52 y.o. female presenting with abdominal pain. The history is provided by the patient. No language interpreter was used.  Abdominal Pain Pain location:  RUQ Pain quality: cramping   Pain severity:  Severe Duration:  2 weeks Timing:  Intermittent Chronicity:  Recurrent Worsened by:  Eating Associated symptoms: nausea and vomiting   Associated symptoms: no diarrhea, no fatigue and no hematuria   Risk factors comment:  Gallbladder sludge   HPI Comments: [name] is a 52 y.o. female who presents to the Emergency Department complaining of 2 weeks of intermittent, severe, cramping RUQ abdominal pain with associated nausea and vomiting.  Pt states her symptoms are worse after eating.  She denies diarrhea.  She reports that she had similar symptoms 4 months ago and received an ultrasound which revealed sludge in her gallbladder.  She was advised that she needs to have her gallbladder removed but did not do so at the time due to financial and insurance issues.  She states that she now wants to have it removed.   Past Medical History  Diagnosis Date  . Arthritis   . COPD (chronic obstructive pulmonary disease)   . Anxiety   . Palpitations   . Constipation     History reviewed. No pertinent past surgical history.   No family history on file.   History  Substance Use Topics  . Smoking status: Former Smoker -- 0.50 packs/day  . Smokeless tobacco: Not on file  . Alcohol Use: No    OB History   Grav Para Term Preterm Abortions TAB SAB Ect Mult Living                  Review of Systems  Constitutional: Negative for appetite change and fatigue.  HENT: Negative for  congestion, sinus pressure and ear discharge.   Gastrointestinal: Positive for nausea, vomiting and abdominal pain. Negative for diarrhea.  Genitourinary: Negative for frequency and hematuria.  Musculoskeletal: Negative for back pain.  Neurological: Negative for seizures.  Psychiatric/Behavioral: Negative for hallucinations.     Allergies  Penicillins  Home Medications   Current Outpatient Rx  Name  Route  Sig  Dispense  Refill  . albuterol (PROVENTIL HFA;VENTOLIN HFA) 108 (90 BASE) MCG/ACT inhaler   Inhalation   Inhale 2 puffs into the lungs every 6 (six) hours as needed for wheezing.         . clonazePAM (KLONOPIN) 0.5 MG tablet   Oral   Take 0.5 mg by mouth 4 (four) times daily as needed. For anxiety          . lamoTRIgine (LAMICTAL) 100 MG tablet   Oral   Take 200 mg by mouth daily.          Marland Kitchen omeprazole (PRILOSEC) 20 MG capsule   Oral   Take 1 capsule (20 mg total) by mouth daily.   20 capsule   0    BP 148/79  Pulse 62  Temp(Src) 98.1 F (36.7 C) (Oral)  Resp 18  Ht 5' (1.524 m)  Wt 142 lb (64.411 kg)  BMI 27.73 kg/m2  SpO2 100%  Physical Exam  Nursing note and  vitals reviewed. Constitutional: She is oriented to person, place, and time. She appears well-developed.  HENT:  Head: Normocephalic.  Eyes: Conjunctivae and EOM are normal. No scleral icterus.  Neck: Neck supple. No thyromegaly present.  Cardiovascular: Normal rate and regular rhythm.  Exam reveals no gallop and no friction rub.   No murmur heard. Pulmonary/Chest: No stridor. She has no wheezes. She has no rales. She exhibits no tenderness.  Abdominal: Soft. She exhibits no distension. There is tenderness (moderate, RUQ). There is no rebound.  Musculoskeletal: Normal range of motion. She exhibits no edema.  Lymphadenopathy:    She has no cervical adenopathy.  Neurological: She is oriented to person, place, and time. Coordination normal.  Skin: No rash noted. No erythema.  Psychiatric:  She has a normal mood and affect. Her behavior is normal.    ED Course  Procedures (including critical care time)  DIAGNOSTIC STUDIES: Oxygen Saturation is 100% on room air, normal by my interpretation.    COORDINATION OF CARE: 10:46 AM: Discussed treatment plan which includes pain medication, anti-emetics, US abdomen, and labs.  Pt expressed understanding and agreed to plan.   Labs Review Labs Reviewed  CBC WITH DIFFERENTIAL - Abnormal; Notable for the following:    RBC 5.29 (*)    Hemoglobin 16.3 (*)    HCT 47.8 (*)    All other components within normal limits  COMPREHENSIVE METABOLIC PANEL - Abnormal; Notable for the following:    Total Protein 8.7 (*)    All other components within normal limits  URINALYSIS, ROUTINE W REFLEX MICROSCOPIC - Abnormal; Notable for the following:    Specific Gravity, Urine <1.005 (*)    All other components within normal limits  LIPASE, BLOOD    Imaging Review US Abdomen Limited Ruq  12/07/2012   *RADIOLOGY REPORT*  Clinical Data:  Right upper quadrant pain; postprandial  LIMITED ABDOMINAL ULTRASOUND - RIGHT UPPER QUADRANT  Comparison:  Abdominal ultrasound 12/19/2011  Findings:  Gallbladder:  The gallbladder is normal in appearance.  No cholelithiasis.  No gallbladder wall thickening.  No pericholecystic fluid.  Negative sonographic Murphy's sign.  Common bile duct:  The common bile duct is normal in diameter measuring 4 mm.  Liver:  Visualized aspects of the liver are normal in echogenicity.  IMPRESSION: No cholelithiasis or sonographic evidence for acute cholecystitis.                    Original Report Authenticated By: Annia Belt, M.D    MDM  No diagnosis found. [place] nl will refer to gi and tx for gastritis    The chart was scribed for me under my direct supervision.  I personally performed the history, physical, and medical decision making and all procedures in the evaluation of this patient.Benny Lennert, MD 12/07/12 1341

## 2012-12-07 NOTE — ED Notes (Signed)
Pt alert & oriented x4, stable gait. Patient given discharge instructions, paperwork & prescription(s). Patient  instructed to stop at the registration desk to finish any additional paperwork. Patient verbalized understanding. Pt left department w/ no further questions. 

## 2013-04-27 ENCOUNTER — Encounter (HOSPITAL_COMMUNITY): Payer: Self-pay | Admitting: Emergency Medicine

## 2013-04-27 ENCOUNTER — Emergency Department (HOSPITAL_COMMUNITY)
Admission: EM | Admit: 2013-04-27 | Discharge: 2013-04-27 | Disposition: A | Discharge status: Home / Self Care | Payer: Self-pay | Attending: Emergency Medicine | Admitting: Emergency Medicine

## 2013-04-27 ENCOUNTER — Emergency Department (HOSPITAL_COMMUNITY): Payer: Self-pay

## 2013-04-27 DIAGNOSIS — W19XXXA Unspecified fall, initial encounter: Secondary | ICD-10-CM

## 2013-04-27 DIAGNOSIS — Y939 Activity, unspecified: Secondary | ICD-10-CM | POA: Insufficient documentation

## 2013-04-27 DIAGNOSIS — F411 Generalized anxiety disorder: Secondary | ICD-10-CM | POA: Insufficient documentation

## 2013-04-27 DIAGNOSIS — Z880 Allergy status to penicillin: Secondary | ICD-10-CM | POA: Insufficient documentation

## 2013-04-27 DIAGNOSIS — S6390XA Sprain of unspecified part of unspecified wrist and hand, initial encounter: Secondary | ICD-10-CM | POA: Insufficient documentation

## 2013-04-27 DIAGNOSIS — M129 Arthropathy, unspecified: Secondary | ICD-10-CM | POA: Insufficient documentation

## 2013-04-27 DIAGNOSIS — Z79899 Other long term (current) drug therapy: Secondary | ICD-10-CM | POA: Insufficient documentation

## 2013-04-27 DIAGNOSIS — J4489 Other specified chronic obstructive pulmonary disease: Secondary | ICD-10-CM | POA: Insufficient documentation

## 2013-04-27 DIAGNOSIS — J449 Chronic obstructive pulmonary disease, unspecified: Secondary | ICD-10-CM | POA: Insufficient documentation

## 2013-04-27 DIAGNOSIS — Y92009 Unspecified place in unspecified non-institutional (private) residence as the place of occurrence of the external cause: Secondary | ICD-10-CM | POA: Insufficient documentation

## 2013-04-27 DIAGNOSIS — Z8719 Personal history of other diseases of the digestive system: Secondary | ICD-10-CM | POA: Insufficient documentation

## 2013-04-27 DIAGNOSIS — Z87891 Personal history of nicotine dependence: Secondary | ICD-10-CM | POA: Insufficient documentation

## 2013-04-27 DIAGNOSIS — Z8619 Personal history of other infectious and parasitic diseases: Secondary | ICD-10-CM | POA: Insufficient documentation

## 2013-04-27 DIAGNOSIS — S79929A Unspecified injury of unspecified thigh, initial encounter: Principal | ICD-10-CM

## 2013-04-27 DIAGNOSIS — S79919A Unspecified injury of unspecified hip, initial encounter: Secondary | ICD-10-CM | POA: Insufficient documentation

## 2013-04-27 DIAGNOSIS — S63602A Unspecified sprain of left thumb, initial encounter: Secondary | ICD-10-CM

## 2013-04-27 DIAGNOSIS — M25551 Pain in right hip: Secondary | ICD-10-CM

## 2013-04-27 DIAGNOSIS — W010XXA Fall on same level from slipping, tripping and stumbling without subsequent striking against object, initial encounter: Secondary | ICD-10-CM | POA: Insufficient documentation

## 2013-04-27 HISTORY — DX: Unspecified staphylococcus as the cause of diseases classified elsewhere: B95.8

## 2013-04-27 MED ORDER — KETOROLAC TROMETHAMINE 60 MG/2ML IM SOLN
60.0000 mg | Freq: Once | INTRAMUSCULAR | Status: AC
  Administered 2013-04-27: 60 mg via INTRAMUSCULAR
  Filled 2013-04-27: qty 2

## 2013-04-27 MED ORDER — NAPROXEN 500 MG PO TABS
500.0000 mg | ORAL_TABLET | Freq: Two times a day (BID) | ORAL | Status: DC
Start: 2013-04-27 — End: 2014-10-23

## 2013-04-27 MED ORDER — HYDROCODONE-ACETAMINOPHEN 5-325 MG PO TABS: 1.0000 | ORAL_TABLET | ORAL | Status: DC | PRN

## 2013-04-27 MED ORDER — HYDROCODONE-ACETAMINOPHEN 5-325 MG PO TABS
1.0000 | ORAL_TABLET | Freq: Once | ORAL | Status: AC
  Administered 2013-04-27: 1 via ORAL
  Filled 2013-04-27: qty 1

## 2013-04-27 NOTE — Discharge Instructions (Signed)
Finger Sprain A finger sprain is a tear in one of the strong, fibrous tissues that connect the bones (ligaments) in your finger. The severity of the sprain depends on how much of the ligament is torn. The tear can be either partial or complete. CAUSES  Often, sprains are a result of a fall or accident. If you extend your hands to catch an object or to protect yourself, the force of the impact causes the fibers of your ligament to stretch too much. This excess tension causes the fibers of your ligament to tear. SYMPTOMS  You may have some loss of motion in your finger. Other symptoms include:  Bruising.  Tenderness.  Swelling. DIAGNOSIS  In order to diagnose finger sprain, your caregiver will physically examine your finger or thumb to determine how torn the ligament is. Your caregiver may also suggest an X-ray exam of your finger to make sure no bones are broken. TREATMENT  If your ligament is only partially torn, treatment usually involves keeping the finger in a fixed position (immobilization) for a short period. To do this, your caregiver will apply a bandage, cast, or splint to keep your finger from moving until it heals. For a partially torn ligament, the healing process usually takes 2 to 3 weeks. If your ligament is completely torn, you may need surgery to reconnect the ligament to the bone. After surgery a cast or splint will be applied and will need to stay on your finger or thumb for 4 to 6 weeks while your ligament heals. HOME CARE INSTRUCTIONS  Keep your injured finger elevated, when possible, to decrease swelling.  To ease pain and swelling, apply ice to your joint twice a day, for 2 to 3 days:  Put ice in a plastic bag.  Place a towel between your skin and the bag.  Leave the ice on for 15 minutes.  Only take over-the-counter or prescription medicine for pain as directed by your caregiver.  Do not wear rings on your injured finger.  Do not leave your finger unprotected  until pain and stiffness go away (usually 3 to 4 weeks).  Do not allow your cast or splint to get wet. Cover your cast or splint with a plastic bag when you shower or bathe. Do not swim.  Your caregiver may suggest special exercises for you to do during your recovery to prevent or limit permanent stiffness. SEEK IMMEDIATE MEDICAL CARE IF:  Your cast or splint becomes damaged.  Your pain becomes worse rather than better. MAKE SURE YOU:  Understand these instructions.  Will watch your condition.  Will get help right away if you are not doing well or get worse. Document Released: 04/21/2004 Document Revised: 06/06/2011 Document Reviewed: 11/15/2010 [facility] Patient Information 2014 [place], [place].   Wear the splint to protect your thumb.  Take the medications as prescribed, use caution with hydrocodone as this medication will make you sleepy, do not drive within 4 hours of taking this medication.  Elevation and ice may help with initial pain, however I also encourage a 20 minute heat  treatment 3 times daily as your injury is now 133 days old, this may help to speed up the healing process.  Please call  either of the orthopedic physicians listed above for further management of your condition if your pain is not improving  over the next week.

## 2013-04-27 NOTE — ED Notes (Signed)
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 Pt states she can get a ride but can't get anyone to get the child. This nurse will discuss with EDP.

## 2013-04-27 NOTE — ED Notes (Signed)
Pt states that she fell 3 days ago due to her arthritis in her legs and she tripped over her dog, c/o right hip and left thumb pain. Cms intact distal

## 2013-04-27 NOTE — ED Provider Notes (Signed)
CSN: [id number]     Arrival date & time 04/27/13  4098 History   First MD Initiated Contact with Patient 04/27/13 1017     Chief Complaint  Patient presents with  . Hand Pain  . Hip Pain   (Consider location/radiation/quality/duration/timing/severity/associated sxs/prior Treatment) HPI Comments: [name] is a 53 y.o. Female presenting with pain in her right hip and her left thumb for past 3 days after tripped over her dog and falling, landing on hard flooring in her home. She describes hyperextending the thumb with the fall. She denies head injury and loc.  She has tried tylenol and ibuprofen without relief of pain which is worsened in the hand with palpation, attempts at moving the thumb and just with slight touch or bumping against objects.  Her right lateral hip is "sore" but tolerates ambulation without difficulty.  She denies back and neck pain, no radiation of pain into her legs.      The history is provided by the patient.    Past Medical History  Diagnosis Date  . Arthritis   . COPD (chronic obstructive pulmonary disease)   . Anxiety   . Palpitations   . Constipation   . Staph infection    Past Surgical History  Procedure Laterality Date  . Groin debridement      due to spider bite,    No family history on file. History  Substance Use Topics  . Smoking status: Former Smoker -- 0.50 packs/day  . Smokeless tobacco: Not on file  . Alcohol Use: No   OB History   Grav Para Term Preterm Abortions TAB SAB Ect Mult Living                 Review of Systems  Constitutional: Negative for fever.  Musculoskeletal: Positive for arthralgias and joint swelling. Negative for myalgias.  Neurological: Negative for weakness and numbness.    Allergies  Penicillins  Home Medications   Current Outpatient Rx  Name  Route  Sig  Dispense  Refill  . albuterol (PROVENTIL HFA;VENTOLIN HFA) 108 (90 BASE) MCG/ACT inhaler   Inhalation   Inhale 2 puffs into the lungs every 6  (six) hours as needed for wheezing.         . clonazePAM (KLONOPIN) 0.5 MG tablet   Oral   Take 0.5 mg by mouth 4 (four) times daily as needed. For anxiety          . ibuprofen (ADVIL,MOTRIN) 200 MG tablet   Oral   Take 200 mg by mouth 2 (two) times daily as needed for moderate pain.         Marland Kitchen lamoTRIgine (LAMICTAL) 100 MG tablet   Oral   Take 200 mg by mouth daily.          Marland Kitchen omeprazole (PRILOSEC) 20 MG capsule   Oral   Take 1 capsule (20 mg total) by mouth daily.   20 capsule   0   . ranitidine (ZANTAC) 75 MG tablet   Oral   Take 75 mg by mouth daily as needed for heartburn.         . risperiDONE (RISPERDAL) 3 MG tablet   Oral   Take 1.5 mg by mouth at bedtime.         Marland Kitchen HYDROcodone-acetaminophen (NORCO/VICODIN) 5-325 MG per tablet   Oral   Take 1 tablet by mouth every 4 (four) hours as needed for moderate pain.   15 tablet   0   . naproxen (NAPROSYN)  500 MG tablet   Oral   Take 1 tablet (500 mg total) by mouth 2 (two) times daily.   30 tablet   0    BP 132/81  Pulse 79  Temp(Src) 98 F (36.7 C) (Oral)  Resp 20  SpO2 98% Physical Exam  Constitutional: She appears well-developed and well-nourished.  HENT:  Head: Atraumatic.  Neck: Normal range of motion.  Cardiovascular:  Pulses equal bilaterally  Musculoskeletal: She exhibits tenderness.       Right hip: She exhibits tenderness. She exhibits normal range of motion.       Left hand: She exhibits decreased range of motion and swelling. She exhibits normal capillary refill and no deformity.  ttp right lateral hip over greater trochanter,  No ecchymosis.  No groin pain, no pain with passive rotation of the hip.  Pain,  Swelling, decreased ROM of left thumb with ttp over mcp joint.  Cap refill less than 3 sec.  Neurological: She is alert. She has normal strength. She displays normal reflexes. No sensory deficit.  Equal strength  Skin: Skin is warm and dry.  Psychiatric: She has a normal mood and  affect.    ED Course  Procedures (including critical care time) Labs Review Labs Reviewed - No data to display Imaging Review Dg Hip Complete Right  04/27/2013   CLINICAL DATA:  Pain post fall.  EXAM: RIGHT HIP - COMPLETE 2+ VIEW  COMPARISON:  None.  FINDINGS: There is no evidence of hip fracture or dislocation. There is no evidence of arthropathy or other focal bone abnormality. The patient is skeletally immature.  IMPRESSION: Negative.   Electronically Signed   By: Oley Balmaniel  Hassell M.D.   On: 04/27/2013 11:34   Dg Hand Complete Left  04/27/2013   CLINICAL DATA:  Pain and swelling post fall  EXAM: LEFT HAND - COMPLETE 3+ VIEW  COMPARISON:  None.  FINDINGS: There is no evidence of fracture or dislocation. 2 mm corticated ossicle projects at the dorsal aspect of the left thumb IP joint. There is no other evidence of arthropathy or other focal bone abnormality. Soft tissues are unremarkable.  IMPRESSION: No acute abnormality.   Electronically Signed   By: Oley Balmaniel  Hassell M.D.   On: 04/27/2013 11:36    EKG Interpretation   None       MDM   1. Fall   2. Right hip pain   3. Left thumb sprain    Patients labs and/or radiological studies were viewed and considered during the medical decision making and disposition process. Pt prescribed naproxen, hydrocodone,  Thumb spica splint provided.  Referral to ortho if sx not improved over the next week.    Burgess AmorJulie Moise Friday, PA-C 04/27/13 1721

## 2013-04-27 NOTE — ED Notes (Signed)
Pt alert & oriented x4, stable gait. Patient given discharge instructions, paperwork & prescription(s). Patient  instructed to stop at the registration desk to finish any additional paperwork. Patient verbalized understanding. Pt left department w/ no further questions. 

## 2013-04-28 NOTE — ED Provider Notes (Signed)
Medical screening examination/treatment/procedure(s) were performed by non-physician practitioner and as supervising physician I was immediately available for consultation/collaboration.  Lorraina Spring, MD 04/28/13 0758   Medical screening examination/treatment/procedure(s) were performed by non-physician practitioner and as supervising physician I was immediately available for consultation/collaboration.      Gerhard Munch, MD 04/28/13 [phone]

## 2013-07-01 ENCOUNTER — Emergency Department
Admission: EM | Admit: 2013-07-01 | Discharge: 2013-07-01 | Discharge status: Absent without leave | Payer: Self-pay | Source: Home / Self Care

## 2013-11-29 ENCOUNTER — Emergency Department (HOSPITAL_COMMUNITY): Payer: Self-pay

## 2013-11-29 ENCOUNTER — Emergency Department (HOSPITAL_COMMUNITY)
Admission: EM | Admit: 2013-11-29 | Discharge: 2013-11-29 | Disposition: A | Discharge status: Home / Self Care | Payer: Self-pay | Attending: Emergency Medicine | Admitting: Emergency Medicine

## 2013-11-29 ENCOUNTER — Encounter (HOSPITAL_COMMUNITY): Payer: Self-pay | Admitting: Emergency Medicine

## 2013-11-29 DIAGNOSIS — J449 Chronic obstructive pulmonary disease, unspecified: Secondary | ICD-10-CM | POA: Insufficient documentation

## 2013-11-29 DIAGNOSIS — R112 Nausea with vomiting, unspecified: Secondary | ICD-10-CM | POA: Insufficient documentation

## 2013-11-29 DIAGNOSIS — R932 Abnormal findings on diagnostic imaging of liver and biliary tract: Secondary | ICD-10-CM | POA: Insufficient documentation

## 2013-11-29 DIAGNOSIS — Z8619 Personal history of other infectious and parasitic diseases: Secondary | ICD-10-CM | POA: Insufficient documentation

## 2013-11-29 DIAGNOSIS — Z3202 Encounter for pregnancy test, result negative: Secondary | ICD-10-CM | POA: Insufficient documentation

## 2013-11-29 DIAGNOSIS — Z79899 Other long term (current) drug therapy: Secondary | ICD-10-CM | POA: Insufficient documentation

## 2013-11-29 DIAGNOSIS — Z880 Allergy status to penicillin: Secondary | ICD-10-CM | POA: Insufficient documentation

## 2013-11-29 DIAGNOSIS — M129 Arthropathy, unspecified: Secondary | ICD-10-CM | POA: Insufficient documentation

## 2013-11-29 DIAGNOSIS — K5909 Other constipation: Secondary | ICD-10-CM | POA: Insufficient documentation

## 2013-11-29 DIAGNOSIS — E860 Dehydration: Secondary | ICD-10-CM | POA: Insufficient documentation

## 2013-11-29 DIAGNOSIS — F411 Generalized anxiety disorder: Secondary | ICD-10-CM | POA: Insufficient documentation

## 2013-11-29 DIAGNOSIS — Z87891 Personal history of nicotine dependence: Secondary | ICD-10-CM | POA: Insufficient documentation

## 2013-11-29 DIAGNOSIS — Z791 Long term (current) use of non-steroidal anti-inflammatories (NSAID): Secondary | ICD-10-CM | POA: Insufficient documentation

## 2013-11-29 DIAGNOSIS — J4489 Other specified chronic obstructive pulmonary disease: Secondary | ICD-10-CM | POA: Insufficient documentation

## 2013-11-29 DIAGNOSIS — R1084 Generalized abdominal pain: Secondary | ICD-10-CM | POA: Insufficient documentation

## 2013-11-29 HISTORY — DX: Other specified diseases of gallbladder: K82.8

## 2013-11-29 LAB — COMPREHENSIVE METABOLIC PANEL
ALK PHOS: 112 U/L (ref 39–117)
ALT: 32 U/L (ref 0–35)
AST: 43 U/L — ABNORMAL HIGH (ref 0–37)
Albumin: 4.5 g/dL (ref 3.5–5.2)
Anion gap: 14 (ref 5–15)
BUN: 8 mg/dL (ref 6–23)
CALCIUM: 10.3 mg/dL (ref 8.4–10.5)
CO2: 27 meq/L (ref 19–32)
Chloride: 98 mEq/L (ref 96–112)
Creatinine, Ser: 0.53 mg/dL (ref 0.50–1.10)
GLUCOSE: 95 mg/dL (ref 70–99)
Potassium: 6.4 mEq/L — ABNORMAL HIGH (ref 3.7–5.3)
SODIUM: 139 meq/L (ref 137–147)
Total Bilirubin: 0.4 mg/dL (ref 0.3–1.2)
Total Protein: 9.7 g/dL — ABNORMAL HIGH (ref 6.0–8.3)

## 2013-11-29 LAB — URINALYSIS, ROUTINE W REFLEX MICROSCOPIC
BILIRUBIN URINE: NEGATIVE
GLUCOSE, UA: NEGATIVE mg/dL
HGB URINE DIPSTICK: NEGATIVE
KETONES UR: NEGATIVE mg/dL
Nitrite: NEGATIVE
PROTEIN: NEGATIVE mg/dL
Specific Gravity, Urine: 1.031 — ABNORMAL HIGH (ref 1.005–1.030)
Urobilinogen, UA: 0.2 mg/dL (ref 0.0–1.0)
pH: 8 (ref 5.0–8.0)

## 2013-11-29 LAB — BASIC METABOLIC PANEL
Anion gap: 12 (ref 5–15)
BUN: 7 mg/dL (ref 6–23)
CHLORIDE: 104 meq/L (ref 96–112)
CO2: 27 meq/L (ref 19–32)
Calcium: 9.1 mg/dL (ref 8.4–10.5)
Creatinine, Ser: 0.57 mg/dL (ref 0.50–1.10)
GFR calc Af Amer: >90 mL/min (ref >90)
GFR calc non Af Amer: >90 mL/min (ref >90)
GLUCOSE: 97 mg/dL (ref 70–99)
POTASSIUM: 4.3 meq/L (ref 3.7–5.3)
SODIUM: 143 meq/L (ref 137–147)

## 2013-11-29 LAB — CBC WITH DIFFERENTIAL/PLATELET
Basophils Absolute: 0 10*3/uL (ref 0.0–0.1)
Basophils Relative: 0 % (ref 0–1)
EOS PCT: 4 % (ref 0–5)
Eosinophils Absolute: 0.4 10*3/uL (ref 0.0–0.7)
HEMATOCRIT: 48.1 % — AB (ref 36.0–46.0)
Hemoglobin: 16.6 g/dL — ABNORMAL HIGH (ref 12.0–15.0)
LYMPHS ABS: 3.5 10*3/uL (ref 0.7–4.0)
LYMPHS PCT: 37 % (ref 12–46)
MCH: 30.9 pg (ref 26.0–34.0)
MCHC: 34.5 g/dL (ref 30.0–36.0)
MCV: 89.4 fL (ref 78.0–100.0)
Monocytes Absolute: 0.7 10*3/uL (ref 0.1–1.0)
Monocytes Relative: 8 % (ref 3–12)
Neutro Abs: 4.8 10*3/uL (ref 1.7–7.7)
Neutrophils Relative %: 51 % (ref 43–77)
PLATELETS: 291 10*3/uL (ref 150–400)
RBC: 5.38 MIL/uL — AB (ref 3.87–5.11)
RDW: 12.5 % (ref 11.5–15.5)
WBC: 9.4 10*3/uL (ref 4.0–10.5)

## 2013-11-29 LAB — URINE MICROSCOPIC-ADD ON

## 2013-11-29 LAB — PREGNANCY, URINE: Preg Test, Ur: NEGATIVE

## 2013-11-29 LAB — LIPASE, BLOOD: Lipase: 49 U/L (ref 11–59)

## 2013-11-29 LAB — POC OCCULT BLOOD, ED: Fecal Occult Bld: NEGATIVE

## 2013-11-29 MED ORDER — SODIUM CHLORIDE 0.9 % IV BOLUS (SEPSIS)
1000.0000 mL | Freq: Once | INTRAVENOUS | Status: AC
  Administered 2013-11-29: 1000 mL via INTRAVENOUS

## 2013-11-29 MED ORDER — FLEET ENEMA 7-19 GM/118ML RE ENEM: 1.0000 | ENEMA | Freq: Once | RECTAL | Status: DC

## 2013-11-29 MED ORDER — MILK AND MOLASSES ENEMA
1.0000 | Freq: Once | RECTAL | Status: AC
  Administered 2013-11-29: 250 mL via RECTAL
  Filled 2013-11-29: qty 250

## 2013-11-29 MED ORDER — HYDROMORPHONE HCL PF 1 MG/ML IJ SOLN
0.5000 mg | Freq: Once | INTRAMUSCULAR | Status: AC
  Administered 2013-11-29: 0.5 mg via INTRAVENOUS
  Filled 2013-11-29: qty 1

## 2013-11-29 MED ORDER — IOHEXOL 300 MG/ML  SOLN
100.0000 mL | Freq: Once | INTRAMUSCULAR | Status: AC | PRN
  Administered 2013-11-29: 100 mL via INTRAVENOUS

## 2013-11-29 MED ORDER — ONDANSETRON HCL 4 MG/2ML IJ SOLN
4.0000 mg | Freq: Once | INTRAMUSCULAR | Status: AC
  Administered 2013-11-29: 4 mg via INTRAVENOUS
  Filled 2013-11-29: qty 2

## 2013-11-29 MED ORDER — POLYETHYLENE GLYCOL 3350 17 G PO PACK
17.0000 g | PACK | Freq: Every day | ORAL | Status: DC
  Administered 2013-11-29: 17 g via ORAL
  Filled 2013-11-29: qty 1

## 2013-11-29 MED ORDER — MORPHINE SULFATE 4 MG/ML IJ SOLN
4.0000 mg | Freq: Once | INTRAMUSCULAR | Status: AC
  Administered 2013-11-29: 4 mg via INTRAVENOUS
  Filled 2013-11-29: qty 1

## 2013-11-29 MED ORDER — POLYETHYLENE GLYCOL 3350 17 GM/SCOOP PO POWD: 17.0000 g | Freq: Two times a day (BID) | ORAL | Status: DC

## 2013-11-29 NOTE — ED Notes (Addendum)
Pt reports lack of BM since august 17th.  Pt reports epigastric pain started 4 days ago and was giving a cleansing solution with no relief. Pt reports cramping sharp pain and is passing flatus. Pt reports throwing up bile for the past 4 days and is unable to keep anything down. [facility] to disimpact her with only a result of a small piece of stool.

## 2013-11-29 NOTE — ED Notes (Signed)
Pt c/o upper abdominal pain x 2 days, and emesis x 3-4 days ago, and constipation since 8/17.  Pain score 9/10.  Pt reports taking an enema and "something that was supposed to taste like Sprite, but didn't"  w/o relief.

## 2013-11-29 NOTE — ED Notes (Signed)
Pt attempted to use female urinal

## 2013-11-29 NOTE — ED Notes (Signed)
Pt made aware of need of urine specimen. Pt will provide specimen when RN is finished with IV

## 2013-11-29 NOTE — ED Notes (Signed)
Patient transported to CT 

## 2013-11-29 NOTE — ED Provider Notes (Signed)
CSN: [id number]     Arrival date & time 11/29/13  1537 History   First MD Initiated Contact with Patient 11/29/13 1636     Chief Complaint  Patient presents with  . Abdominal Pain  . Constipation     (Consider location/radiation/quality/duration/timing/severity/associated sxs/prior Treatment) HPI Comments: The patient is a 53 year old female, currently in 63 [place], presents emergency room chief complaint of abdominal pain for 4 days. She reports constipation last bowel movement 8/17. She reports multiple episodes of vomiting for the past 4 days, reports vomiting up most meals. Denies history of abdominal surgeries, unknown if passing gas. Denies bright right blood per rectum. [facility] without resolution of symptoms.  The history is provided by the patient. No language interpreter was used.    Past Medical History  Diagnosis Date  . Arthritis   . COPD (chronic obstructive pulmonary disease)   . Anxiety   . Palpitations   . Constipation   . Staph infection   . Gallbladder sludge    Past Surgical History  Procedure Laterality Date  . Groin debridement      due to spider bite,    History reviewed. No pertinent family history. History  Substance Use Topics  . Smoking status: Former Smoker -- 0.50 packs/day  . Smokeless tobacco: Not on file  . Alcohol Use: No   OB History   Grav Para Term Preterm Abortions TAB SAB Ect Mult Living                 Review of Systems  Constitutional: Negative for fever and chills.  Gastrointestinal: Positive for nausea, vomiting, abdominal pain, constipation and abdominal distention. Negative for diarrhea, blood in stool and anal bleeding.  Genitourinary: Negative for dysuria and flank pain.      Allergies  Penicillins  Home Medications   Prior to Admission medications   Medication Sig Start Date End Date Taking? Authorizing Provider  albuterol (PROVENTIL  HFA;VENTOLIN HFA) 108 (90 BASE) MCG/ACT inhaler Inhale 2 puffs into the lungs every 6 (six) hours as needed for wheezing.    Historical Provider, MD  clonazePAM (KLONOPIN) 0.5 MG tablet Take 0.5 mg by mouth 4 (four) times daily as needed. For anxiety     Historical Provider, MD  HYDROcodone-acetaminophen (NORCO/VICODIN) 5-325 MG per tablet Take 1 tablet by mouth every 4 (four) hours as needed for moderate pain. 04/27/13   Burgess Amor, PA-C  ibuprofen (ADVIL,MOTRIN) 200 MG tablet Take 200 mg by mouth 2 (two) times daily as needed for moderate pain.    Historical Provider, MD  lamoTRIgine (LAMICTAL) 100 MG tablet Take 200 mg by mouth daily.     Historical Provider, MD  naproxen (NAPROSYN) 500 MG tablet Take 1 tablet (500 mg total) by mouth 2 (two) times daily. 04/27/13   Burgess Amor, PA-C  omeprazole (PRILOSEC) 20 MG capsule Take 1 capsule (20 mg total) by mouth daily. 12/19/11   Tatyana A Kirichenko, PA-C  ranitidine (ZANTAC) 75 MG tablet Take 75 mg by mouth daily as needed for heartburn.    Historical Provider, MD  risperiDONE (RISPERDAL) 3 MG tablet Take 1.5 mg by mouth at bedtime.    Historical Provider, MD   BP 138/102  Pulse 88  Temp(Src) 98.1 F (36.7 C) (Oral)  Resp 16  SpO2 94% Physical Exam  Nursing note and vitals reviewed. Constitutional: She is oriented to person, place, and time. She appears well-developed and well-nourished.  HENT:  Head: Normocephalic and atraumatic.  Eyes: EOM are normal. Pupils are equal, round, and reactive to light.  Neck: Neck supple.  Cardiovascular: Normal rate and regular rhythm.   Pulmonary/Chest: Effort normal and breath sounds normal. She has no wheezes. She has no rales.  Abdominal: Soft. Normal appearance. Bowel sounds are increased. There is generalized tenderness. There is no rebound and no CVA tenderness.  Genitourinary:  No stool in rectal vault. Chaperone present.   Neurological: She is alert and oriented to person, place, and time.  Skin:  Skin is warm and dry. She is not diaphoretic.  Psychiatric: She has a normal mood and affect. Her behavior is normal.    ED Course  Procedures (including critical care time) Labs Review Labs Reviewed - No data to display  Results for orders placed during the hospital encounter of 11/29/13  CBC WITH DIFFERENTIAL      Result Value Ref Range   WBC 9.4  4.0 - 10.5 K/uL   RBC 5.38 (*) 3.87 - 5.11 MIL/uL   Hemoglobin 16.6 (*) 12.0 - 15.0 g/dL   HCT 81.1 (*) 91.4 - 78.2 %   MCV 89.4  78.0 - 100.0 fL   MCH 30.9  26.0 - 34.0 pg   MCHC 34.5  30.0 - 36.0 g/dL   RDW 95.6  21.3 - 08.6 %   Platelets 291  150 - 400 K/uL   Neutrophils Relative % 51  43 - 77 %   Neutro Abs 4.8  1.7 - 7.7 K/uL   Lymphocytes Relative 37  12 - 46 %   Lymphs Abs 3.5  0.7 - 4.0 K/uL   Monocytes Relative 8  3 - 12 %   Monocytes Absolute 0.7  0.1 - 1.0 K/uL   Eosinophils Relative 4  0 - 5 %   Eosinophils Absolute 0.4  0.0 - 0.7 K/uL   Basophils Relative 0  0 - 1 %   Basophils Absolute 0.0  0.0 - 0.1 K/uL  COMPREHENSIVE METABOLIC PANEL      Result Value Ref Range   Sodium 139  137 - 147 mEq/L   Potassium 6.4 (*) 3.7 - 5.3 mEq/L   Chloride 98  96 - 112 mEq/L   CO2 27  19 - 32 mEq/L   Glucose, Bld 95  70 - 99 mg/dL   BUN 8  6 - 23 mg/dL   Creatinine, Ser 5.78  0.50 - 1.10 mg/dL   Calcium 46.9  8.4 - 62.9 mg/dL   Total Protein 9.7 (*) 6.0 - 8.3 g/dL   Albumin 4.5  3.5 - 5.2 g/dL   AST 43 (*) 0 - 37 U/L   ALT 32  0 - 35 U/L   Alkaline Phosphatase 112  39 - 117 U/L   Total Bilirubin 0.4  0.3 - 1.2 mg/dL   GFR calc non Af Amer >90  >90 mL/min   GFR calc Af Amer >90  >90 mL/min   Anion gap 14  5 - 15  LIPASE, BLOOD      Result Value Ref Range   Lipase 49  11 - 59 U/L  URINALYSIS, ROUTINE W REFLEX MICROSCOPIC      Result Value Ref Range   Color, Urine YELLOW  YELLOW   APPearance CLEAR  CLEAR   Specific Gravity, Urine 1.031 (*) 1.005 - 1.030   pH 8.0  5.0 - 8.0   Glucose, UA NEGATIVE  NEGATIVE mg/dL   Hgb  urine dipstick NEGATIVE  NEGATIVE  Bilirubin Urine NEGATIVE  NEGATIVE   Ketones, ur NEGATIVE  NEGATIVE mg/dL   Protein, ur NEGATIVE  NEGATIVE mg/dL   Urobilinogen, UA 0.2  0.0 - 1.0 mg/dL   Nitrite NEGATIVE  NEGATIVE   Leukocytes, UA SMALL (*) NEGATIVE  PREGNANCY, URINE      Result Value Ref Range   Preg Test, Ur NEGATIVE  NEGATIVE  BASIC METABOLIC PANEL      Result Value Ref Range   Sodium 143  137 - 147 mEq/L   Potassium 4.3  3.7 - 5.3 mEq/L   Chloride 104  96 - 112 mEq/L   CO2 27  19 - 32 mEq/L   Glucose, Bld 97  70 - 99 mg/dL   BUN 7  6 - 23 mg/dL   Creatinine, Ser 3.23  0.50 - 1.10 mg/dL   Calcium 9.1  8.4 - 55.7 mg/dL   GFR calc non Af Amer >90  >90 mL/min   GFR calc Af Amer >90  >90 mL/min   Anion gap 12  5 - 15  URINE MICROSCOPIC-ADD ON      Result Value Ref Range   Squamous Epithelial / LPF RARE  RARE   WBC, UA 3-6  <3 WBC/hpf   Bacteria, UA RARE  RARE  POC OCCULT BLOOD, ED      Result Value Ref Range   Fecal Occult Bld NEGATIVE  NEGATIVE   Ct Abdomen Pelvis W Contrast  11/29/2013   CLINICAL DATA:  Sharp abdominal pain and cramping. Constipation. Bilious vomiting.  EXAM: CT ABDOMEN AND PELVIS WITH CONTRAST  TECHNIQUE: Multidetector CT imaging of the abdomen and pelvis was performed using the standard protocol following bolus administration of intravenous contrast.  CONTRAST:  OMNIPAQUE IOHEXOL 300 MG/ML  SOLN  COMPARISON:  None.  FINDINGS: Liver: 2 small low-attenuation lesions are seen in the posterior right hepatic lobe, measuring 12 mm on image 21 and 14 mm on image 24. These are indeterminate. No other liver masses are identified.  Gallbladder/Biliary:  Unremarkable.  Pancreas: No mass, inflammatory changes, or other parenchymal abnormality identified.  Spleen:  Within normal limits in size and appearance.  Adrenal Glands:  No mass identified.  Kidneys/Urinary Tract: No masses identified. No evidence of hydronephrosis. Several tiny renal cysts are noted  bilaterally. Urinary bladder is distended but otherwise unremarkable in appearance.  Lymph Nodes:  No pathologically enlarged lymph nodes identified.  Pelvic/Reproductive Organs: A small partially calcified posterior subserosal fibroid is seen measuring 2.6 cm. Benign appearing left ovarian cyst or follicle is seen measuring 1.9 cm.  Bowel/Peritoneum: Small hiatal hernia noted. Large amount of stool seen throughout the colon. No evidence of small bowel dilatation. Left colonic diverticulosis is noted, however there is no evidence of diverticulitis. No other inflammatory process or abnormal fluid collections identified. Normal appendix visualized.  Vascular:  No evidence of abdominal aortic aneurysm.  Musculoskeletal:  No suspicious bone lesions identified.  Other:  None.  IMPRESSION: Large stool burden noted, which may be seen with constipation or fecal impaction.  Diverticulosis. No radiographic evidence of diverticulitis.  Small hiatal hernia.  Small posterior uterine fibroid measuring 2.6 cm.  Distended urinary bladder noted; recommend clinical correlation for possible urinary retention.  Indeterminate low-attenuation liver masses in the posterior right hepatic lobe. Abdomen MRI without and with contrast recommended for further evaluation. This recommendation follows ACR consensus guidelines: Managing Incidental Findings on Abdominal CT: White Paper of the ACR Incidental Findings Committee. Earlyne Iba Radiol [phone]   Electronically Signed  By: Myles Rosenthal M.D.   On: 11/29/2013 19:42    Imaging Review No results found.   EKG Interpretation None      MDM   Final diagnoses:  Other constipation  Dehydration  Abnormal liver CT   Patient presents with constipation, no stool in rectal vault no history of abdominal surgeries, patient complained of vomiting sent to rule out obstruction. Labs ordered a CT ordered, fluids pain medication and nausea medication given. CMP shows hemolysis will repeat  for potassium. Repeat potassium, normal. UA shows concentration, no sign of infection patient received one fluid bolus plan to 2nd NS L. CT without evidence of obstruction, incidental hepatic mass is seen, large stool burden. Plan to give MiraLAX and enema. Pt reports having a small BM in ED, reports feeling better.  Discussed lab results, imaging results, and treatment plan with the patient. Discussed need to followup with a provider for further evaluation of liver masses, incidental finding on CT. Return precautions given. Reports understanding and no other concerns at this time.  Patient is stable for discharge at this time.  Meds given in ED:  Medications  polyethylene glycol (MIRALAX / GLYCOLAX) packet 17 g (17 g Oral Given 11/29/13 2020)  ondansetron (ZOFRAN) injection 4 mg (4 mg Intravenous Given 11/29/13 1730)  sodium chloride 0.9 % bolus 1,000 mL (0 mLs Intravenous Stopped 11/29/13 1855)  morphine 4 MG/ML injection 4 mg (4 mg Intravenous Given 11/29/13 1730)  HYDROmorphone (DILAUDID) injection 0.5 mg (0.5 mg Intravenous Given 11/29/13 1826)  iohexol (OMNIPAQUE) 300 MG/ML solution 100 mL (100 mLs Intravenous Contrast Given 11/29/13 1855)  milk and molasses enema (250 mLs Rectal Given 11/29/13 2038)  sodium chloride 0.9 % bolus 1,000 mL (0 mLs Intravenous Stopped 11/29/13 2259)    Discharge Medication List as of 11/29/2013 10:39 PM    START taking these medications   Details  polyethylene glycol powder (GLYCOLAX/MIRALAX) powder Take 17 g by mouth 2 (two) times daily. Until daily soft stools  OTC, Starting 11/29/2013, Until Discontinued, Print    sodium phosphate (FLEET) 7-19 GM/118ML ENEM Place 133 mLs (1 enema total) rectally once., Starting 11/29/2013, Print           Mellody Drown, PA-C 11/30/13 [phone]

## 2013-11-29 NOTE — ED Notes (Signed)
Pt sts she had a small bowel movement when she gave her urine sample.

## 2013-11-29 NOTE — ED Notes (Signed)
Pt reminded of need for urine pt states still unable to void

## 2013-11-29 NOTE — ED Notes (Signed)
Pt attempted to use bedpan but was unsuccessful at this time

## 2013-11-29 NOTE — Discharge Instructions (Signed)
Call for a follow up appointment with a GI specialist for further evaluation of your abnormal liver findings on CT>  Return if Symptoms worsen.   Take medication as prescribed.  Drink plenty of fluids.

## 2013-11-30 NOTE — ED Provider Notes (Signed)
Medical screening examination/treatment/procedure(s) were performed by non-physician practitioner and as supervising physician I was immediately available for consultation/collaboration.   EKG Interpretation   Date/Time:  Friday November 29 2013 16:19:26 EDT Ventricular Rate:  88 PR Interval:  147 QRS Duration: 80 QT Interval:  366 QTC Calculation: 443 R Axis:   88 Text Interpretation:  Sinus rhythm Nonspecific T abnrm, anterolateral  leads No significant change since last tracing 23 Sept 2013 Confirmed by  [facility]  MD-I, IVA ([id number]) on 11/29/2013 5:24:48 PM        Layla Maw Ward, DO 11/30/13 1530

## 2014-07-26 ENCOUNTER — Encounter (HOSPITAL_COMMUNITY): Payer: Self-pay | Admitting: *Deleted

## 2014-07-26 ENCOUNTER — Emergency Department (HOSPITAL_COMMUNITY): Payer: 59

## 2014-07-26 ENCOUNTER — Emergency Department (HOSPITAL_COMMUNITY)
Admission: EM | Admit: 2014-07-26 | Discharge: 2014-07-26 | Disposition: A | Discharge status: Home / Self Care | Payer: 59 | Attending: Emergency Medicine | Admitting: Emergency Medicine

## 2014-07-26 DIAGNOSIS — Z8659 Personal history of other mental and behavioral disorders: Secondary | ICD-10-CM | POA: Insufficient documentation

## 2014-07-26 DIAGNOSIS — M1990 Unspecified osteoarthritis, unspecified site: Secondary | ICD-10-CM | POA: Insufficient documentation

## 2014-07-26 DIAGNOSIS — Z8619 Personal history of other infectious and parasitic diseases: Secondary | ICD-10-CM | POA: Insufficient documentation

## 2014-07-26 DIAGNOSIS — J449 Chronic obstructive pulmonary disease, unspecified: Secondary | ICD-10-CM | POA: Insufficient documentation

## 2014-07-26 DIAGNOSIS — L03119 Cellulitis of unspecified part of limb: Secondary | ICD-10-CM

## 2014-07-26 DIAGNOSIS — Z8719 Personal history of other diseases of the digestive system: Secondary | ICD-10-CM | POA: Insufficient documentation

## 2014-07-26 DIAGNOSIS — Z791 Long term (current) use of non-steroidal anti-inflammatories (NSAID): Secondary | ICD-10-CM | POA: Insufficient documentation

## 2014-07-26 DIAGNOSIS — Z87891 Personal history of nicotine dependence: Secondary | ICD-10-CM | POA: Insufficient documentation

## 2014-07-26 DIAGNOSIS — Z79899 Other long term (current) drug therapy: Secondary | ICD-10-CM | POA: Insufficient documentation

## 2014-07-26 DIAGNOSIS — L03114 Cellulitis of left upper limb: Secondary | ICD-10-CM | POA: Insufficient documentation

## 2014-07-26 MED ORDER — CLINDAMYCIN HCL 300 MG PO CAPS: 300.0000 mg | ORAL_CAPSULE | Freq: Four times a day (QID) | ORAL | Status: DC

## 2014-07-26 MED ORDER — HYDROCODONE-ACETAMINOPHEN 5-325 MG PO TABS: 1.0000 | ORAL_TABLET | Freq: Four times a day (QID) | ORAL | Status: DC | PRN

## 2014-07-26 MED ORDER — HYDROCODONE-ACETAMINOPHEN 5-325 MG PO TABS
1.0000 | ORAL_TABLET | Freq: Once | ORAL | Status: AC
  Administered 2014-07-26: 1 via ORAL
  Filled 2014-07-26: qty 1

## 2014-07-26 NOTE — Discharge Instructions (Signed)
Follow up or return here for continued or worsening symptoms Cellulitis Cellulitis is an infection of the skin and the tissue under the skin. The infected area is usually red and tender. This happens most often in the arms and lower legs. HOME CARE   Take your antibiotic medicine as told. Finish the medicine even if you start to feel better.  Keep the infected arm or leg raised (elevated).  Put a warm cloth on the area up to 4 times per day.  Only take medicines as told by your doctor.  Keep all doctor visits as told. GET HELP IF:  You see red streaks on the skin coming from the infected area.  Your red area gets bigger or turns a dark color.  Your bone or joint under the infected area is painful after the skin heals.  Your infection comes back in the same area or different area.  You have a puffy (swollen) bump in the infected area.  You have new symptoms.  You have a fever. GET HELP RIGHT AWAY IF:   You feel very sleepy.  You throw up (vomit) or have watery poop (diarrhea).  You feel sick and have muscle aches and pains. MAKE SURE YOU:   Understand these instructions.  Will watch your condition.  Will get help right away if you are not doing well or get worse. Document Released: 08/31/2007 Document Revised: 07/29/2013 Document Reviewed: 05/30/2011 [facility] Patient Information 2015 [place], [place]. This information is not intended to replace advice given to you by your health care provider. Make sure you discuss any questions you have with your health care provider.

## 2014-07-26 NOTE — ED Notes (Signed)
Declined W/C at D/C and was escorted to lobby by RN. 

## 2014-07-26 NOTE — ED Provider Notes (Signed)
CSN: [id number]     Arrival date & time 07/26/14  1191 History   First MD Initiated Contact with Patient 07/26/14 [phone]     Chief Complaint  Patient presents with  . Hand Problem     (Consider location/radiation/quality/duration/timing/severity/associated sxs/prior Treatment) HPI Comments: Pt comes in with c/o left hand swelling after working in the yard 2 days ago. States that the area is redness, swollen and hot. Has tried ibuprofen without relief. No known injury. No history of similar symptoms.  The history is provided by the patient. No language interpreter was used.    Past Medical History  Diagnosis Date  . Arthritis   . COPD (chronic obstructive pulmonary disease)   . Anxiety   . Palpitations   . Constipation   . Staph infection   . Gallbladder sludge    Past Surgical History  Procedure Laterality Date  . Groin debridement      due to spider bite,    No family history on file. History  Substance Use Topics  . Smoking status: Former Smoker -- 0.50 packs/day  . Smokeless tobacco: Not on file  . Alcohol Use: No   OB History    No data available     Review of Systems  All other systems reviewed and are negative.     Allergies  Penicillins  Home Medications   Prior to Admission medications   Medication Sig Start Date End Date Taking? Authorizing Provider  albuterol (ACCUNEB) 1.25 MG/3ML nebulizer solution Take 1 ampule by nebulization every 6 (six) hours as needed for wheezing.    Historical Provider, MD  lamoTRIgine (LAMICTAL) 100 MG tablet Take 200 mg by mouth daily.     Historical Provider, MD  naproxen (NAPROSYN) 500 MG tablet Take 1 tablet (500 mg total) by mouth 2 (two) times daily. 04/27/13   Burgess Amor, PA-C  polyethylene glycol powder (GLYCOLAX/MIRALAX) powder Take 17 g by mouth 2 (two) times daily. Until daily soft stools  OTC 11/29/13   Mellody Drown, PA-C  risperiDONE (RISPERDAL) 3 MG tablet Take 3 mg by mouth at bedtime.     Historical Provider,  MD  sodium phosphate (FLEET) 7-19 GM/118ML ENEM Place 133 mLs (1 enema total) rectally once. 11/29/13   Mellody Drown, PA-C   There were no vitals taken for this visit. Physical Exam  Constitutional: She is oriented to person, place, and time. She appears well-developed and well-nourished.  Cardiovascular: Normal rate and regular rhythm.   Pulmonary/Chest: Effort normal and breath sounds normal.  Musculoskeletal: Normal range of motion.  Pulses intact  Neurological: She is alert and oriented to person, place, and time.  Skin:  Obvious swelling and redness and warmth noted to the dorsal aspect of the left hand  Nursing note and vitals reviewed.   ED Course  Procedures (including critical care time) Labs Review Labs Reviewed - No data to display  Imaging Review Dg Hand Complete Left  07/26/2014   CLINICAL DATA:  Pain and swelling.  No recent trauma  EXAM: LEFT HAND - COMPLETE 3+ VIEW  COMPARISON:  April 27, 2013  FINDINGS: Frontal, oblique, and lateral views obtained. There is soft tissue swelling dorsally. No radiopaque foreign body or focal soft tissue air. No fracture or dislocation. Joint spaces appear intact. No erosive change.  IMPRESSION: Diffuse soft tissue swelling dorsally without radiopaque foreign body or soft tissue air. No bony abnormality. In particular, no fracture or dislocation. No appreciable arthropathy or bony destruction.   Electronically Signed  By: Bretta BangWilliam  Woodruff III M.D.   On: 07/26/2014 09:47     EKG Interpretation None      MDM   Final diagnoses:  Cellulitis of hand    No acute bony abnormality noted. Exam consistent with localized cellulitis. Discussed return precautions with pt.    Teressa LowerVrinda Mazy Culton, NP 07/26/14 [id number]  Benjiman CoreNathan Letroy Vazguez, MD 07/27/14 [phone]

## 2014-07-26 NOTE — ED Notes (Signed)
Pt reports swelling to LT hand after working in the yard.

## 2014-10-23 ENCOUNTER — Encounter (HOSPITAL_COMMUNITY): Payer: Self-pay | Admitting: Nurse Practitioner

## 2014-10-23 ENCOUNTER — Emergency Department (HOSPITAL_COMMUNITY): Payer: 59

## 2014-10-23 ENCOUNTER — Emergency Department (HOSPITAL_COMMUNITY)
Admission: EM | Admit: 2014-10-23 | Discharge: 2014-10-23 | Disposition: A | Discharge status: Home / Self Care | Payer: 59 | Attending: Emergency Medicine | Admitting: Emergency Medicine

## 2014-10-23 DIAGNOSIS — Z791 Long term (current) use of non-steroidal anti-inflammatories (NSAID): Secondary | ICD-10-CM | POA: Insufficient documentation

## 2014-10-23 DIAGNOSIS — Z792 Long term (current) use of antibiotics: Secondary | ICD-10-CM | POA: Insufficient documentation

## 2014-10-23 DIAGNOSIS — Z79899 Other long term (current) drug therapy: Secondary | ICD-10-CM | POA: Insufficient documentation

## 2014-10-23 DIAGNOSIS — Z87891 Personal history of nicotine dependence: Secondary | ICD-10-CM | POA: Insufficient documentation

## 2014-10-23 DIAGNOSIS — F419 Anxiety disorder, unspecified: Secondary | ICD-10-CM | POA: Insufficient documentation

## 2014-10-23 DIAGNOSIS — Z880 Allergy status to penicillin: Secondary | ICD-10-CM | POA: Insufficient documentation

## 2014-10-23 DIAGNOSIS — S6991XA Unspecified injury of right wrist, hand and finger(s), initial encounter: Secondary | ICD-10-CM

## 2014-10-23 DIAGNOSIS — Y9389 Activity, other specified: Secondary | ICD-10-CM | POA: Insufficient documentation

## 2014-10-23 DIAGNOSIS — Y9289 Other specified places as the place of occurrence of the external cause: Secondary | ICD-10-CM | POA: Insufficient documentation

## 2014-10-23 DIAGNOSIS — M1990 Unspecified osteoarthritis, unspecified site: Secondary | ICD-10-CM | POA: Insufficient documentation

## 2014-10-23 DIAGNOSIS — J449 Chronic obstructive pulmonary disease, unspecified: Secondary | ICD-10-CM | POA: Insufficient documentation

## 2014-10-23 DIAGNOSIS — S60931A Unspecified superficial injury of right thumb, initial encounter: Secondary | ICD-10-CM | POA: Insufficient documentation

## 2014-10-23 DIAGNOSIS — Z8719 Personal history of other diseases of the digestive system: Secondary | ICD-10-CM | POA: Insufficient documentation

## 2014-10-23 DIAGNOSIS — Y998 Other external cause status: Secondary | ICD-10-CM | POA: Insufficient documentation

## 2014-10-23 DIAGNOSIS — W1839XA Other fall on same level, initial encounter: Secondary | ICD-10-CM | POA: Insufficient documentation

## 2014-10-23 DIAGNOSIS — Z8619 Personal history of other infectious and parasitic diseases: Secondary | ICD-10-CM | POA: Insufficient documentation

## 2014-10-23 DIAGNOSIS — K5900 Constipation, unspecified: Secondary | ICD-10-CM | POA: Insufficient documentation

## 2014-10-23 MED ORDER — HYDROCODONE-ACETAMINOPHEN 5-325 MG PO TABS
2.0000 | ORAL_TABLET | Freq: Once | ORAL | Status: AC
  Administered 2014-10-23: 2 via ORAL
  Filled 2014-10-23: qty 2

## 2014-10-23 MED ORDER — HYDROCODONE-ACETAMINOPHEN 5-325 MG PO TABS
1.0000 | ORAL_TABLET | Freq: Four times a day (QID) | ORAL | Status: DC | PRN
Start: 2014-10-23 — End: 2014-12-16

## 2014-10-23 MED ORDER — NAPROXEN 500 MG PO TABS: 500.0000 mg | ORAL_TABLET | Freq: Two times a day (BID) | ORAL | Status: DC

## 2014-10-23 NOTE — ED Provider Notes (Signed)
CSN: [id number]     Arrival date & time 10/23/14  1104 History   First MD Initiated Contact with Patient 10/23/14 1206     Chief Complaint  Patient presents with  . Finger Injury     (Consider location/radiation/quality/duration/timing/severity/associated sxs/prior Treatment) HPI Comments: Patient presents today with right thumb pain.  Pain has been present since falling and bending her thumb backwards two days ago.  She reports increased pain with ROM of the thumb.  She has been taking OTC pain medications without relief.  She denies numbness or tingling.  She reports associated swelling of the thumb.    The history is provided by the patient.    Past Medical History  Diagnosis Date  . Arthritis   . COPD (chronic obstructive pulmonary disease)   . Anxiety   . Palpitations   . Constipation   . Staph infection   . Gallbladder sludge    Past Surgical History  Procedure Laterality Date  . Groin debridement      due to spider bite,    History reviewed. No pertinent family history. History  Substance Use Topics  . Smoking status: Former Smoker -- 0.50 packs/day  . Smokeless tobacco: Not on file  . Alcohol Use: No   OB History    No data available     Review of Systems  Musculoskeletal: Positive for joint swelling.  Skin: Negative for color change and wound.  Neurological: Negative for numbness.      Allergies  Penicillins  Home Medications   Prior to Admission medications   Medication Sig Start Date End Date Taking? Authorizing Provider  albuterol (ACCUNEB) 1.25 MG/3ML nebulizer solution Take 1 ampule by nebulization every 6 (six) hours as needed for wheezing.    Historical Provider, MD  clindamycin (CLEOCIN) 300 MG capsule Take 1 capsule (300 mg total) by mouth 4 (four) times daily. 07/26/14   Teressa Lower, NP  HYDROcodone-acetaminophen (NORCO/VICODIN) 5-325 MG per tablet Take 1-2 tablets by mouth every 6 (six) hours as needed. 07/26/14   Teressa Lower, NP   lamoTRIgine (LAMICTAL) 100 MG tablet Take 200 mg by mouth daily.     Historical Provider, MD  naproxen (NAPROSYN) 500 MG tablet Take 1 tablet (500 mg total) by mouth 2 (two) times daily. 04/27/13   Burgess Amor, PA-C  polyethylene glycol powder (GLYCOLAX/MIRALAX) powder Take 17 g by mouth 2 (two) times daily. Until daily soft stools  OTC 11/29/13   Mellody Drown, PA-C  risperiDONE (RISPERDAL) 3 MG tablet Take 3 mg by mouth at bedtime.     Historical Provider, MD  sodium phosphate (FLEET) 7-19 GM/118ML ENEM Place 133 mLs (1 enema total) rectally once. 11/29/13   Lauren Parker, PA-C   BP 173/98 mmHg  Pulse 71  Temp(Src) 97.5 F (36.4 C) (Oral)  Resp 16  SpO2 93% Physical Exam  Constitutional: She appears well-developed and well-nourished.  HENT:  Head: Normocephalic and atraumatic.  Cardiovascular: Normal rate, regular rhythm and normal heart sounds.   Pulmonary/Chest: Effort normal and breath sounds normal.  Musculoskeletal: Normal range of motion.  Pain with ROM of the thumb.  Minimal swelling of the thumb.  No erythema, warmth, or bruising of the thumb.    Neurological: She is alert.  Distal sensation of the right thumb intact  Skin: Skin is dry and intact.  Nursing note and vitals reviewed.   ED Course  Procedures (including critical care time) Labs Review Labs Reviewed - No data to display  Imaging Review Dg Finger  Thumb Right  10/23/2014   CLINICAL DATA:  Fall yesterday with hyperextension of first digit, pain and swelling, initial encounter  EXAM: RIGHT THUMB 2+V  COMPARISON:  None.  FINDINGS: No acute fracture or dislocation is noted. Mild degenerative changes are noted at the first [facility] joint as well as the MCP and interphalangeal joints.  IMPRESSION: Degenerative changes without acute abnormality.   Electronically Signed   By: Alcide Clever M.D.   On: 10/23/2014 11:50     EKG Interpretation None      MDM   Final diagnoses:  None   Patient presents today with pain of  the right thumb after hyperextending thumb 2 days ago.  Xray negative for acute findings.  Neurovascularly intact.  Patient given velcro thumb spica.  Pt stable for discharge.  Return precautions given.      Santiago Glad, PA-C 10/25/14 1759  Nelva Nay, MD 11/04/14 0700

## 2014-10-23 NOTE — ED Notes (Signed)
Pt reports R thumb pain and swelling after she fell onto it several days ago. Painful to move, skin w/d, skin color wnl.

## 2014-12-13 ENCOUNTER — Emergency Department (HOSPITAL_COMMUNITY): Payer: 59

## 2014-12-13 ENCOUNTER — Encounter (HOSPITAL_COMMUNITY): Payer: Self-pay | Admitting: Emergency Medicine

## 2014-12-13 ENCOUNTER — Emergency Department (HOSPITAL_COMMUNITY)
Admission: EM | Admit: 2014-12-13 | Discharge: 2014-12-13 | Disposition: A | Discharge status: Home / Self Care | Payer: 59 | Attending: Emergency Medicine | Admitting: Emergency Medicine

## 2014-12-13 DIAGNOSIS — Y9389 Activity, other specified: Secondary | ICD-10-CM | POA: Insufficient documentation

## 2014-12-13 DIAGNOSIS — M1990 Unspecified osteoarthritis, unspecified site: Secondary | ICD-10-CM | POA: Insufficient documentation

## 2014-12-13 DIAGNOSIS — W228XXA Striking against or struck by other objects, initial encounter: Secondary | ICD-10-CM | POA: Insufficient documentation

## 2014-12-13 DIAGNOSIS — S62396A Other fracture of fifth metacarpal bone, right hand, initial encounter for closed fracture: Secondary | ICD-10-CM | POA: Insufficient documentation

## 2014-12-13 DIAGNOSIS — F419 Anxiety disorder, unspecified: Secondary | ICD-10-CM | POA: Insufficient documentation

## 2014-12-13 DIAGNOSIS — Y998 Other external cause status: Secondary | ICD-10-CM | POA: Insufficient documentation

## 2014-12-13 DIAGNOSIS — Z79899 Other long term (current) drug therapy: Secondary | ICD-10-CM | POA: Insufficient documentation

## 2014-12-13 DIAGNOSIS — S6291XA Unspecified fracture of right wrist and hand, initial encounter for closed fracture: Secondary | ICD-10-CM

## 2014-12-13 DIAGNOSIS — Y9289 Other specified places as the place of occurrence of the external cause: Secondary | ICD-10-CM | POA: Insufficient documentation

## 2014-12-13 DIAGNOSIS — Z8619 Personal history of other infectious and parasitic diseases: Secondary | ICD-10-CM | POA: Insufficient documentation

## 2014-12-13 DIAGNOSIS — Z720 Tobacco use: Secondary | ICD-10-CM | POA: Insufficient documentation

## 2014-12-13 DIAGNOSIS — Z880 Allergy status to penicillin: Secondary | ICD-10-CM | POA: Insufficient documentation

## 2014-12-13 DIAGNOSIS — K5900 Constipation, unspecified: Secondary | ICD-10-CM | POA: Insufficient documentation

## 2014-12-13 DIAGNOSIS — J441 Chronic obstructive pulmonary disease with (acute) exacerbation: Secondary | ICD-10-CM | POA: Insufficient documentation

## 2014-12-13 MED ORDER — IBUPROFEN 800 MG PO TABS
800.0000 mg | ORAL_TABLET | Freq: Once | ORAL | Status: AC
  Administered 2014-12-13: 800 mg via ORAL
  Filled 2014-12-13: qty 1

## 2014-12-13 MED ORDER — OXYCODONE-ACETAMINOPHEN 5-325 MG PO TABS
2.0000 | ORAL_TABLET | Freq: Once | ORAL | Status: AC
  Administered 2014-12-13: 2 via ORAL
  Filled 2014-12-13: qty 2

## 2014-12-13 MED ORDER — OXYCODONE-ACETAMINOPHEN 5-325 MG PO TABS: 1.0000 | ORAL_TABLET | Freq: Four times a day (QID) | ORAL | Status: DC | PRN

## 2014-12-13 NOTE — ED Provider Notes (Signed)
CSN: [id number]     Arrival date & time 12/13/14  1434 History   First MD Initiated Contact with Patient 12/13/14 1527     Chief Complaint  Patient presents with  . Hand Injury     (Consider location/radiation/quality/duration/timing/severity/associated sxs/prior Treatment) HPI Comments: Patient states that she became overwhelmed, and upset after the death of the lower one, and family member involvement. She states that she punched a wooden door on yesterday morning. She noted bruising and pain, tried to apply ice. The pain got progressively worse. Today the pain was worse, the swelling was worse, and she could not take the pain any longer. She denies being on any anticoagulation medications. She has not had any previous operations or procedures involving the right hand. She presents now for evaluation of this issue as the pain would not go away with conservative management.  The history is provided by the patient.    Past Medical History  Diagnosis Date  . Arthritis   . COPD (chronic obstructive pulmonary disease)   . Anxiety   . Palpitations   . Constipation   . Staph infection   . Gallbladder sludge    Past Surgical History  Procedure Laterality Date  . Groin debridement      due to spider bite,    History reviewed. No pertinent family history. Social History  Substance Use Topics  . Smoking status: Current Every Day Smoker -- 0.25 packs/day  . Smokeless tobacco: None  . Alcohol Use: No   OB History    Gravida Para Term Preterm AB TAB SAB Ectopic Multiple Living            3     Review of Systems  Respiratory: Positive for shortness of breath.   Musculoskeletal: Positive for arthralgias.  Psychiatric/Behavioral: The patient is nervous/anxious.   All other systems reviewed and are negative.     Allergies  Penicillins  Home Medications   Prior to Admission medications   Medication Sig Start Date End Date Taking? Authorizing Provider  amLODipine (NORVASC) 5  MG tablet Take 5 mg by mouth daily.   Yes Historical Provider, MD  hydrochlorothiazide (HYDRODIURIL) 25 MG tablet Take 25 mg by mouth daily.   Yes Historical Provider, MD  lisinopril (PRINIVIL,ZESTRIL) 20 MG tablet Take 20 mg by mouth daily.   Yes Historical Provider, MD  albuterol (ACCUNEB) 1.25 MG/3ML nebulizer solution Take 1 ampule by nebulization every 6 (six) hours as needed for wheezing.    Historical Provider, MD  clindamycin (CLEOCIN) 300 MG capsule Take 1 capsule (300 mg total) by mouth 4 (four) times daily. Patient not taking: Reported on 12/13/2014 07/26/14   Teressa Lower, NP  HYDROcodone-acetaminophen (NORCO/VICODIN) 5-325 MG per tablet Take 1-2 tablets by mouth every 6 (six) hours as needed. Patient not taking: Reported on 12/13/2014 10/23/14   Santiago Glad, PA-C  naproxen (NAPROSYN) 500 MG tablet Take 1 tablet (500 mg total) by mouth 2 (two) times daily. Patient not taking: Reported on 12/13/2014 10/23/14   Santiago Glad, PA-C  polyethylene glycol powder (GLYCOLAX/MIRALAX) powder Take 17 g by mouth 2 (two) times daily. Until daily soft stools  OTC Patient not taking: Reported on 12/13/2014 11/29/13   Mellody Drown, PA-C   BP 155/93 mmHg  Pulse 87  Temp(Src) 98.1 F (36.7 C) (Oral)  Resp 19  Ht 5' (1.524 m)  Wt 167 lb (75.751 kg)  BMI 32.62 kg/m2  SpO2 98% Physical Exam  Constitutional: She is oriented to person, place, and time.  She appears well-developed and well-nourished.  Non-toxic appearance.  HENT:  Head: Normocephalic.  Right Ear: Tympanic membrane and external ear normal.  Left Ear: Tympanic membrane and external ear normal.  Eyes: EOM and lids are normal. Pupils are equal, round, and reactive to light.  Neck: Normal range of motion. Neck supple. Carotid bruit is not present.  Cardiovascular: Normal rate, regular rhythm, normal heart sounds, intact distal pulses and normal pulses.   Pulmonary/Chest: Breath sounds normal. No respiratory distress.  Abdominal:  Soft. Bowel sounds are normal. There is no tenderness. There is no guarding.  Musculoskeletal: Normal range of motion.       Right shoulder: Normal. She exhibits normal range of motion, no tenderness and no deformity.       Arms:      Hands: Lymphadenopathy:       Head (right side): No submandibular adenopathy present.       Head (left side): No submandibular adenopathy present.    She has no cervical adenopathy.  Neurological: She is alert and oriented to person, place, and time. She has normal strength. No cranial nerve deficit or sensory deficit.  Skin: Skin is warm and dry.  Psychiatric: She has a normal mood and affect. Her speech is normal.  Tearful during exam. Upset about the death of a love one recently.  Nursing note and vitals reviewed.   ED Course FRACTURE CARE RIGHT HAND Using a copy of the x-ray, the fracture to the right hand was demonstrated to the patient in terms which he understands. The procedure was explained to the patient in terms which he understands. The patient was identified by arm band, procedural time out taken before the procedure. The radial pulses and capillary refill were established before the procedure. The patient was then fitted with an ulnar gutter splint and sling. The capillary refill is less than 2 seconds after the procedure. There no temperature changes appreciated. The patient tolerated the procedure without problem. Patient treated with oral Percocet for her pain.    Procedures (including critical care time) Labs Review Labs Reviewed - No data to display  Imaging Review Dg Hand Complete Right  12/13/2014   CLINICAL DATA:  Yesterday patient hit wooden door, pain, redness, swelling of right hand  EXAM: RIGHT HAND - COMPLETE 3+ VIEW  COMPARISON:  None.  FINDINGS: There is a fracture of the distal right fifth metacarpal. Fracture is minimally displaced anteriorly, by 2 mm. There is anterior angulation of approximately 30 degrees. No significant  comminution.  No other fractures.  The joints are normally spaced and aligned.  There is ulnar sided and dorsal hand soft tissue swelling.  IMPRESSION: Fracture of the distal right fifth metacarpal as described.   Electronically Signed   By: Amie Portland M.D.   On: 12/13/2014 15:24   I have personally reviewed and evaluated these images and lab results as part of my medical decision-making.   EKG Interpretation None      MDM  Vital signs within normal limits. X-ray reveals a fracture of the distal right fifth metacarpal on with minimal displacement. The patient was fitted with an oral gutter splint and sling. The patient will follow up with Dr. Romeo Apple for evaluation and for further treatment. Prescription for ibuprofen and Percocet given to the patient. Ice pack is been provided as well.    Final diagnoses:  None    *I have reviewed nursing notes, vital signs, and all appropriate lab and imaging results for this patient.**  Ivery Quale, PA-C 12/13/14 1648  Blane Ohara, MD 12/14/14 [phone]

## 2014-12-13 NOTE — Discharge Instructions (Signed)
You have a fracture of your right hand that will need orthopedic assistance. Please keep the splint clean and dry. Please see the orthopedic listed above, or the orthopedic specialist of your choice as soon as possible for a more permanent cast and for management of this fracture. Please keep it elevated and apply ice. Use the sling until seen by the orthopedic specialist. Please do not take the splint off. Please use 600 mg of ibuprofen and 5 mg of Percocet every 6 hours for assistance with pain and inflammation. Percocet may cause drowsiness, and/or constipation. Please use this medication with caution. Boxer's Fracture You have a break (fracture) of the fifth metacarpal bone. This is commonly called a boxer's fracture. This is the bone in the hand where the little finger attaches. The fracture is in the end of that bone, closest to the little finger. It is usually caused when you hit an object with a clenched fist. Often, the knuckle is pushed down by the impact. Sometimes, the fracture rotates out of position. A boxer's fracture will usually heal within 6 weeks, if it is treated properly and protected from re-injury. Surgery is sometimes needed. A cast, splint, or bulky hand dressing may be used to protect and immobilize a boxer's fracture. Do not remove this device or dressing until your caregiver approves. Keep your hand elevated, and apply ice packs for 15-20 minutes every 2 hours, for the first 2 days. Elevation and ice help reduce swelling and relieve pain. See your caregiver, or an orthopedic specialist, for follow-up care within the next 10 days. This is to make sure your fracture is healing properly. Document Released: 03/14/2005 Document Revised: 06/06/2011 Document Reviewed: 09/01/2006 [facility] Patient Information 2015 [place], [place]. This information is not intended to replace advice given to you by your health care provider. Make sure you discuss any questions you have with your health care  provider.

## 2014-12-13 NOTE — ED Notes (Signed)
PT c/o right hand pain and swelling after punching a wooden door yesterday morning. Swelling and bruising noted to right hand.

## 2014-12-15 ENCOUNTER — Encounter: Payer: Self-pay | Admitting: Orthopedic Surgery

## 2014-12-15 NOTE — Progress Notes (Addendum)
[facility] ED CM received a call from ED unit secretary from pt who was seen at [facility] ED on 12/13/14 overwhelmed, and upset after the death of the lower one, and family member involvement. She states that she punched a wooden door dx fracture of the distal right fifth metacarpal on with minimal displacement. The patient was fitted with an oral gutter splint and sling. The patient will follow up with Dr. Romeo Apple for evaluation and for further treatment.  Pt upset and tearful because she was informed by Dr Romeo Apple orthopedic office that she would have to pay $125 "up front"  Pt states she does not have the money Cm explained to her that [facility] (this includes the [facility] facility) does not have a program to assist with this co pay Cm inquired about local family, friends and church members to assist her Pt stated she did not have anyone Cm inquired about pcp Pt states she does not have insurance nor pcp Cm had to return a call to pt after initially speaking with her Pt provided a return number as [phone] vs number listed in EPIC [phone] --CM updated EPIC with pt most recent number  When Cm returned a call to pt Cm explained to this uninsured pt that she may need to call the Orthopedic office to ask to speak with the billing office to ask for possible payment plan and tell them her concerns At this time pt states she has a daughter (previously informed CM she had no family members) Daughter heard in back ground as pt repeated [facility] [employer]  [address] [place], [place] [address]  [phone] and Orthopedic number [facility] [address] [place] [place], [place] [address] [phone] Cm encouraged pt to call Ortho MD, to get assist from clinic if unable to work with Ortho.  Pt mentioned that she as told about a pin needed to be placed Cm did not see this in the notes from EDP/PA/NP but saw pt needed further evaluation from an orthopedic provider and informed pt of this Cm informed pt that generally the  Orthopedic provider would not come to [facility] if she was stabilized and ask to be seen on an outpatient basis (and the [facility] MD notes indicates pt was referred to outpatient provider)  Cm explained to the pt that presently she is not in an emergency situation since she was seen at [facility] ED and fitted with a splint and sling and requested to be seen by orthopedic for further evaluation Pt voiced understanding

## 2014-12-16 ENCOUNTER — Ambulatory Visit (INDEPENDENT_AMBULATORY_CARE_PROVIDER_SITE_OTHER): Payer: 59 | Admitting: Orthopedic Surgery

## 2014-12-16 VITALS — BP 153/99 | Ht 60.0 in | Wt 167.0 lb

## 2014-12-16 DIAGNOSIS — S62339A Displaced fracture of neck of unspecified metacarpal bone, initial encounter for closed fracture: Secondary | ICD-10-CM

## 2014-12-16 MED ORDER — HYDROCODONE-ACETAMINOPHEN 10-325 MG PO TABS: 1.0000 | ORAL_TABLET | ORAL | Status: DC | PRN

## 2014-12-16 NOTE — Progress Notes (Signed)
Patient ID: [name], female   DOB: [birth year], 54 y.o.   MRN: [id number]  New patient   Chief Complaint  Patient presents with  . Follow-up    [facility] ER follow up on right hand fracture. DOI 12-13-14.     [name] is a 54 y.o. female.   HPI 54 years old patient had a door when she found out her son's girlfriend was murdered. Presents with 3 day history of right hand pain over the fifth metacarpal sharp dull aching severe. No deformity noted. Review of systems. Review of Systems See hpi  Past Medical History  Diagnosis Date  . Arthritis   . COPD (chronic obstructive pulmonary disease)   . Anxiety   . Palpitations   . Constipation   . Staph infection   . Gallbladder sludge     Past Surgical History  Procedure Laterality Date  . Groin debridement      due to spider bite,     No family history on file.  Social History Social History  Substance Use Topics  . Smoking status: Current Every Day Smoker -- 0.25 packs/day  . Smokeless tobacco: Not on file  . Alcohol Use: No    Allergies  Allergen Reactions  . Penicillins Swelling    Current Outpatient Prescriptions  Medication Sig Dispense Refill  . albuterol (ACCUNEB) 1.25 MG/3ML nebulizer solution Take 1 ampule by nebulization every 6 (six) hours as needed for wheezing.    Marland Kitchen amLODipine (NORVASC) 5 MG tablet Take 5 mg by mouth daily.    . clindamycin (CLEOCIN) 300 MG capsule Take 1 capsule (300 mg total) by mouth 4 (four) times daily. (Patient not taking: Reported on 12/13/2014) 28 capsule 0  . hydrochlorothiazide (HYDRODIURIL) 25 MG tablet Take 25 mg by mouth daily.    Marland Kitchen HYDROcodone-acetaminophen (NORCO/VICODIN) 5-325 MG per tablet Take 1-2 tablets by mouth every 6 (six) hours as needed. (Patient not taking: Reported on 12/13/2014) 8 tablet 0  . lisinopril (PRINIVIL,ZESTRIL) 20 MG tablet Take 20 mg by mouth daily.    . naproxen (NAPROSYN) 500 MG tablet Take 1 tablet (500 mg total) by mouth 2 (two) times  daily. (Patient not taking: Reported on 12/13/2014) 30 tablet 0  . oxyCODONE-acetaminophen (PERCOCET/ROXICET) 5-325 MG per tablet Take 1 tablet by mouth every 6 (six) hours as needed. 20 tablet 0  . polyethylene glycol powder (GLYCOLAX/MIRALAX) powder Take 17 g by mouth 2 (two) times daily. Until daily soft stools  OTC (Patient not taking: Reported on 12/13/2014) 119 g 0   No current facility-administered medications for this visit.       Physical Exam Blood pressure 153/99, height 5' (1.524 m), weight 167 lb (75.751 kg). Physical Exam The patient is well developed well nourished and well groomed. Orientation to person place and time is normal  Mood is pleasant. Ambulatory status is normal without a limp Skin remains intact without laceration ulceration or erythema Gross motor exam is intact without atrophy. Muscle tone normal grade 5 motor strength Neurovascular exam remains intact Evaluation of the right hand shows no rotatory malalignment when compared to the left passive extension test shows that the finger is lined up. Tenderness at the fracture site swelling of the hand skin is intact as stated above. Joint range of motion at the MP joint definitively diminished stability not tested due to pain  Data Reviewed  Films show angulation of the fifth metacarpal at the metaphyseal diaphyseal junction but is less than 45.   Assessment  Fifth metacarpal fracture closed with mild angulation less than 45. We will splint the finger and then institute active range of motion Plan   Ulnar gutter splint applied right hand, Splint, 2 weeks x-ray, aluminum splint 2 weeks and then active range of motion for 4 weeks  Meds ordered this encounter  Medications  . HYDROcodone-acetaminophen (NORCO) 10-325 MG per tablet    Sig: Take 1 tablet by mouth every 4 (four) hours as needed.    Dispense:  84 tablet    Refill:  0

## 2014-12-16 NOTE — Patient Instructions (Signed)
Boxer's Fracture °You have a break (fracture) of the fifth metacarpal bone. This is commonly called a boxer's fracture. This is the bone in the hand where the little finger attaches. The fracture is in the end of that bone, closest to the little finger. It is usually caused when you hit an object with a clenched fist. Often, the knuckle is pushed down by the impact. Sometimes, the fracture rotates out of position. A boxer's fracture will usually heal within 6 weeks, if it is treated properly and protected from re-injury. Surgery is sometimes needed. °A cast, splint, or bulky hand dressing may be used to protect and immobilize a boxer's fracture. Do not remove this device or dressing until your caregiver approves. Keep your hand elevated, and apply ice packs for 15-20 minutes every 2 hours, for the first 2 days. Elevation and ice help reduce swelling and relieve pain. See your caregiver, or an orthopedic specialist, for follow-up care within the next 10 days. This is to make sure your fracture is healing properly. °Document Released: 03/14/2005 Document Revised: 06/06/2011 Document Reviewed: 09/01/2006 °ExitCare® Patient Information ©2015 [employer]. This information is not intended to replace advice given to you by your health care provider. Make sure you discuss any questions you have with your health care provider. ° °

## 2014-12-23 ENCOUNTER — Ambulatory Visit (INDEPENDENT_AMBULATORY_CARE_PROVIDER_SITE_OTHER): Payer: Self-pay | Admitting: Orthopedic Surgery

## 2014-12-23 VITALS — BP 135/82 | Ht 60.0 in | Wt 167.0 lb

## 2014-12-23 DIAGNOSIS — S62339D Displaced fracture of neck of unspecified metacarpal bone, subsequent encounter for fracture with routine healing: Secondary | ICD-10-CM

## 2014-12-23 NOTE — Patient Instructions (Signed)
Keep previously scheduled follow up appointment

## 2014-12-23 NOTE — Progress Notes (Signed)
Patient ID: [name], female   DOB: 01-Feb-1961, 54 y.o.   MRN: [id number]  Follow up visit  Chief Complaint  Patient presents with  . Cast Problem    splint problem- swelling in right and and fingers, DOI 12/03/14    BP 135/82 mmHg  Ht 5' (1.524 m)  Wt 167 lb (75.751 kg)  BMI 32.62 kg/m2  No diagnosis found.  Patient concerned about swelling. Splint removed. No skin problems. Mild swelling. Patient advised ice and elevate more. We applied splint. Follow-up as noted prior with x-ray

## 2014-12-30 ENCOUNTER — Ambulatory Visit (INDEPENDENT_AMBULATORY_CARE_PROVIDER_SITE_OTHER): Payer: Self-pay | Admitting: Orthopedic Surgery

## 2014-12-30 ENCOUNTER — Ambulatory Visit (INDEPENDENT_AMBULATORY_CARE_PROVIDER_SITE_OTHER): Payer: Self-pay

## 2014-12-30 VITALS — BP 159/94 | Ht 60.0 in | Wt 167.0 lb

## 2014-12-30 DIAGNOSIS — S6291XD Unspecified fracture of right wrist and hand, subsequent encounter for fracture with routine healing: Secondary | ICD-10-CM

## 2014-12-30 DIAGNOSIS — S62339A Displaced fracture of neck of unspecified metacarpal bone, initial encounter for closed fracture: Secondary | ICD-10-CM

## 2014-12-30 MED ORDER — HYDROCODONE-ACETAMINOPHEN 10-325 MG PO TABS: 1.0000 | ORAL_TABLET | ORAL | Status: DC | PRN

## 2014-12-30 NOTE — Progress Notes (Signed)
Patient ID: [name], female   DOB: 1960-06-01, 54 y.o.   MRN: [id number]  Follow up visit  Chief Complaint  Patient presents with  . Follow-up    follow up + xray right hand fracture, DOI 12/13/14    BP 159/94 mmHg  Ht 5' (1.524 m)  Wt 167 lb (75.751 kg)  BMI 32.62 kg/m2  Encounter Diagnoses  Name Primary?  . Right hand fracture, with routine healing, subsequent encounter Yes  . Boxer's metacarpal fracture, neck, closed, initial encounter     The patient has a slightly indurated boxer's fracture of the right hand. She's been in a boxer's fracture splint for the last 17 days. X-rays show persistent angulation of the fracture but less than 45 and she has no rotatory malalignment of the hand  Recommend x-ray in 3 weeks start range of motion and buddy tape the finger. She is extremely tender and sensitive. She is requiring hydrocodone 10 mg for pain relief and she would be a very poor surgical candidate

## 2014-12-30 NOTE — Patient Instructions (Signed)
Practice hand bends

## 2015-01-13 ENCOUNTER — Telehealth: Payer: Self-pay | Admitting: Orthopedic Surgery

## 2015-01-13 ENCOUNTER — Other Ambulatory Visit: Payer: Self-pay | Admitting: Orthopedic Surgery

## 2015-01-13 ENCOUNTER — Other Ambulatory Visit: Payer: Self-pay | Admitting: *Deleted

## 2015-01-13 DIAGNOSIS — S62339A Displaced fracture of neck of unspecified metacarpal bone, initial encounter for closed fracture: Secondary | ICD-10-CM

## 2015-01-13 MED ORDER — HYDROCODONE-ACETAMINOPHEN 7.5-325 MG PO TABS: 1.0000 | ORAL_TABLET | Freq: Four times a day (QID) | ORAL | Status: DC | PRN

## 2015-01-13 MED ORDER — HYDROCODONE-ACETAMINOPHEN 10-325 MG PO TABS: 1.0000 | ORAL_TABLET | ORAL | Status: DC | PRN

## 2015-01-13 NOTE — Telephone Encounter (Signed)
Patient picked up Rx

## 2015-01-13 NOTE — Telephone Encounter (Signed)
Patient is calling asking for a medication refill on HYDROcodone-acetaminophen (NORCO) 10-325 MG tablet  Her follow up appointment with Dr. Romeo AppleHarrison is Tuesday October 25 at 10:15, please advise

## 2015-01-13 NOTE — Telephone Encounter (Signed)
Prescription available, patient aware  

## 2015-01-20 ENCOUNTER — Ambulatory Visit: Payer: Self-pay | Admitting: Orthopedic Surgery

## 2015-02-05 ENCOUNTER — Ambulatory Visit: Payer: Self-pay | Admitting: Orthopedic Surgery

## 2015-03-25 ENCOUNTER — Encounter (HOSPITAL_COMMUNITY): Payer: Self-pay | Admitting: Emergency Medicine

## 2015-03-25 ENCOUNTER — Emergency Department (HOSPITAL_COMMUNITY): Payer: Self-pay

## 2015-03-25 ENCOUNTER — Emergency Department (HOSPITAL_COMMUNITY)
Admission: EM | Admit: 2015-03-25 | Discharge: 2015-03-25 | Disposition: A | Discharge status: Home / Self Care | Payer: Self-pay | Attending: Emergency Medicine | Admitting: Emergency Medicine

## 2015-03-25 DIAGNOSIS — Z8659 Personal history of other mental and behavioral disorders: Secondary | ICD-10-CM | POA: Insufficient documentation

## 2015-03-25 DIAGNOSIS — Z8619 Personal history of other infectious and parasitic diseases: Secondary | ICD-10-CM | POA: Insufficient documentation

## 2015-03-25 DIAGNOSIS — Z792 Long term (current) use of antibiotics: Secondary | ICD-10-CM | POA: Insufficient documentation

## 2015-03-25 DIAGNOSIS — S62609A Fracture of unspecified phalanx of unspecified finger, initial encounter for closed fracture: Secondary | ICD-10-CM

## 2015-03-25 DIAGNOSIS — K5900 Constipation, unspecified: Secondary | ICD-10-CM | POA: Insufficient documentation

## 2015-03-25 DIAGNOSIS — Z791 Long term (current) use of non-steroidal anti-inflammatories (NSAID): Secondary | ICD-10-CM | POA: Insufficient documentation

## 2015-03-25 DIAGNOSIS — Z880 Allergy status to penicillin: Secondary | ICD-10-CM | POA: Insufficient documentation

## 2015-03-25 DIAGNOSIS — M1990 Unspecified osteoarthritis, unspecified site: Secondary | ICD-10-CM | POA: Insufficient documentation

## 2015-03-25 DIAGNOSIS — Y998 Other external cause status: Secondary | ICD-10-CM | POA: Insufficient documentation

## 2015-03-25 DIAGNOSIS — Y9289 Other specified places as the place of occurrence of the external cause: Secondary | ICD-10-CM | POA: Insufficient documentation

## 2015-03-25 DIAGNOSIS — J449 Chronic obstructive pulmonary disease, unspecified: Secondary | ICD-10-CM | POA: Insufficient documentation

## 2015-03-25 DIAGNOSIS — F17210 Nicotine dependence, cigarettes, uncomplicated: Secondary | ICD-10-CM | POA: Insufficient documentation

## 2015-03-25 DIAGNOSIS — S62614A Displaced fracture of proximal phalanx of right ring finger, initial encounter for closed fracture: Secondary | ICD-10-CM | POA: Insufficient documentation

## 2015-03-25 DIAGNOSIS — Y9389 Activity, other specified: Secondary | ICD-10-CM | POA: Insufficient documentation

## 2015-03-25 MED ORDER — HYDROCODONE-ACETAMINOPHEN 5-325 MG PO TABS: 1.0000 | ORAL_TABLET | ORAL | Status: DC | PRN

## 2015-03-25 MED ORDER — HYDROMORPHONE HCL 1 MG/ML IJ SOLN
1.0000 mg | Freq: Once | INTRAMUSCULAR | Status: AC
  Administered 2015-03-25: 1 mg via INTRAMUSCULAR
  Filled 2015-03-25: qty 1

## 2015-03-25 NOTE — Discharge Instructions (Signed)
Finger Fracture Fractures of fingers are breaks in the bones of the fingers. There are many types of fractures. There are different ways of treating these fractures. Your health care provider will discuss the best way to treat your fracture. CAUSES Traumatic injury is the main cause of broken fingers. These include:  Injuries while playing sports.  Workplace injuries.  Falls. RISK FACTORS Activities that can increase your risk of finger fractures include:  Sports.  Workplace activities that involve machinery.  A condition called osteoporosis, which can make your bones less dense and cause them to fracture more easily. SIGNS AND SYMPTOMS The main symptoms of a broken finger are pain and swelling within 15 minutes after the injury. Other symptoms include:  Bruising of your finger.  Stiffness of your finger.  Numbness of your finger.  Exposed bones (compound fracture) if the fracture is severe. DIAGNOSIS  The best way to diagnose a broken bone is with X-ray imaging. Additionally, your health care provider will use this X-ray image to evaluate the position of the broken finger bones.  TREATMENT  Finger fractures can be treated with:   Nonreduction--This means the bones are in place. The finger is splinted without changing the positions of the bone pieces. The splint is usually left on for about a week to 10 days. This will depend on your fracture and what your health care provider thinks.  Closed reduction--The bones are put back into position without using surgery. The finger is then splinted.  Open reduction and internal fixation--The fracture site is opened. Then the bone pieces are fixed into place with pins or some type of hardware. This is seldom required. It depends on the severity of the fracture. HOME CARE INSTRUCTIONS   Follow your health care provider's instructions regarding activities, exercises, and physical therapy.  Only take over-the-counter or prescription  medicines for pain, discomfort, or fever as directed by your health care provider. SEEK MEDICAL CARE IF: You have pain or swelling that limits the motion or use of your fingers. SEEK IMMEDIATE MEDICAL CARE IF:  Your finger becomes numb. MAKE SURE YOU:   Understand these instructions.  Will watch your condition.  Will get help right away if you are not doing well or get worse.   This information is not intended to replace advice given to you by your health care provider. Make sure you discuss any questions you have with your health care provider.   Document Released: 06/26/2000 Document Revised: 01/02/2013 Document Reviewed: 10/24/2012 Elsevier Interactive Patient Education 2016 [place] or Splint Care Casts and splints support injured limbs and keep bones from moving while they heal.  HOME CARE  Keep the cast or splint uncovered during the drying period.  A plaster cast can take 24 to 48 hours to dry.  A fiberglass cast will dry in less than 1 hour.  Do not rest the cast on anything harder than a pillow for 24 hours.  Do not put weight on your injured limb. Do not put pressure on the cast. Wait for your doctor's approval.  Keep the cast or splint dry.  Cover the cast or splint with a plastic bag during baths or wet weather.  If you have a cast over your chest and belly (trunk), take sponge baths until the cast is taken off.  If your cast gets wet, dry it with a towel or blow dryer. Use the cool setting on the blow dryer.  Keep your cast or splint clean. Wash a  dirty cast with a damp cloth.  Do not put any objects under your cast or splint.  Do not scratch the skin under the cast with an object. If itching is a problem, use a blow dryer on a cool setting over the itchy area.  Do not trim or cut your cast.  Do not take out the padding from inside your cast.  Exercise your joints near the cast as told by your doctor.  Raise (elevate) your injured limb on 1  or 2 pillows for the first 1 to 3 days. GET HELP IF:  Your cast or splint cracks.  Your cast or splint is too tight or too loose.  You itch badly under the cast.  Your cast gets wet or has a soft spot.  You have a bad smell coming from the cast.  You get an object stuck under the cast.  Your skin around the cast becomes red or sore.  You have new or more pain after the cast is put on. GET HELP RIGHT AWAY IF:  You have fluid leaking through the cast.  You cannot move your fingers or toes.  Your fingers or toes turn blue or white or are cool, painful, or puffy (swollen).  You have tingling or lose feeling (numbness) around the injured area.  You have bad pain or pressure under the cast.  You have trouble breathing or have shortness of breath.  You have chest pain.   This information is not intended to replace advice given to you by your health care provider. Make sure you discuss any questions you have with your health care provider.   Document Released: 07/14/2010 Document Revised: 11/14/2012 Document Reviewed: 09/20/2012 Elsevier Interactive Patient Education [employer]2016 Elsevier Inc.

## 2015-03-25 NOTE — ED Notes (Signed)
Pt states that she has just had a newly healed boxers fracture of her rt hand , and this afternoon around 1200 her granddaughter took her her fingers of Rt hand and pulled them back- she heard a crack and now has pain
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
## 2015-03-31 ENCOUNTER — Ambulatory Visit (INDEPENDENT_AMBULATORY_CARE_PROVIDER_SITE_OTHER): Payer: Self-pay | Admitting: Orthopedic Surgery

## 2015-03-31 VITALS — BP 156/89 | Ht 60.0 in | Wt 154.0 lb

## 2015-03-31 DIAGNOSIS — S62619A Displaced fracture of proximal phalanx of unspecified finger, initial encounter for closed fracture: Secondary | ICD-10-CM

## 2015-03-31 HISTORY — DX: Displaced fracture of proximal phalanx of unspecified finger, initial encounter for closed fracture: S62.619A

## 2015-03-31 MED ORDER — HYDROCODONE-ACETAMINOPHEN 5-325 MG PO TABS: 1.0000 | ORAL_TABLET | ORAL | Status: DC | PRN
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
## 2015-03-31 NOTE — Patient Instructions (Signed)
We will refer to hand specialist Dr Roda ShuttersXu

## 2015-04-01 ENCOUNTER — Other Ambulatory Visit (HOSPITAL_COMMUNITY): Payer: Self-pay | Admitting: Orthopedic Surgery

## 2015-04-01 ENCOUNTER — Telehealth: Payer: Self-pay | Admitting: Orthopedic Surgery

## 2015-04-01 NOTE — Telephone Encounter (Signed)
Per patient's referral appointment is scheduled at [facility] for 04/01/15, 1:30pm, per Meriam SpragueBeverly; patient is aware; states will be seeing Dr August Saucerean at the initial visit.

## 2015-04-01 NOTE — Telephone Encounter (Signed)
NOTED

## 2015-04-02 ENCOUNTER — Other Ambulatory Visit (HOSPITAL_COMMUNITY): Payer: Self-pay | Admitting: Orthopedic Surgery

## 2015-04-02 DIAGNOSIS — S62614A Displaced fracture of proximal phalanx of right ring finger, initial encounter for closed fracture: Principal | ICD-10-CM

## 2015-04-02 DIAGNOSIS — IMO0001 Reserved for inherently not codable concepts without codable children: Secondary | ICD-10-CM

## 2015-04-03 ENCOUNTER — Other Ambulatory Visit (HOSPITAL_COMMUNITY): Payer: Self-pay | Admitting: Orthopedic Surgery

## 2015-04-06 ENCOUNTER — Ambulatory Visit (HOSPITAL_COMMUNITY)
Admission: RE | Admit: 2015-04-06 | Discharge: 2015-04-06 | Disposition: A | Discharge status: Home / Self Care | Payer: Self-pay | Source: Ambulatory Visit | Attending: Orthopedic Surgery | Admitting: Orthopedic Surgery

## 2015-04-06 ENCOUNTER — Encounter (HOSPITAL_COMMUNITY): Payer: Self-pay | Admitting: *Deleted

## 2015-04-06 DIAGNOSIS — S62614A Displaced fracture of proximal phalanx of right ring finger, initial encounter for closed fracture: Secondary | ICD-10-CM | POA: Insufficient documentation

## 2015-04-06 DIAGNOSIS — IMO0001 Reserved for inherently not codable concepts without codable children: Secondary | ICD-10-CM

## 2015-04-06 MED ORDER — CHLORHEXIDINE GLUCONATE 4 % EX LIQD: 60.0000 mL | Freq: Once | CUTANEOUS | Status: DC

## 2015-04-06 MED ORDER — CLINDAMYCIN PHOSPHATE 900 MG/50ML IV SOLN: 900.0000 mg | INTRAVENOUS | Status: DC

## 2015-04-06 NOTE — Progress Notes (Signed)
Pt denies cardiac history, chest pain or sob. 

## 2015-04-06 NOTE — Anesthesia Preprocedure Evaluation (Addendum)
Anesthesia Evaluation  Patient identified by MRN, date of birth, ID band Patient awake    Reviewed: Allergy & Precautions, NPO status , Patient's Chart, lab work & pertinent test results  Airway Mallampati: II  TM Distance: >3 FB Neck ROM: Full    Dental  (+) Dental Advisory Given   Pulmonary Current Smoker, former smoker,    breath sounds clear to auscultation       Cardiovascular Exercise Tolerance: Poor hypertension, Pt. on medications  Rhythm:Regular Rate:Normal     Neuro/Psych  Headaches, PSYCHIATRIC DISORDERS Anxiety    GI/Hepatic Neg liver ROS, GERD  Medicated and Controlled,  Endo/Other    Renal/GU negative Renal ROS     Musculoskeletal  (+) Arthritis , Osteoarthritis,    Abdominal (+) - obese,   Peds  Hematology   Anesthesia Other Findings   Reproductive/Obstetrics negative OB ROS                          BP Readings from Last 3 Encounters:  04/07/15 123/75  03/31/15 156/89  03/25/15 166/96     Past Surgical History  Procedure Laterality Date  . Groin debridement      due to spider bite,   . Tonsillectomy    . Tubal ligation     Lab Results  Component Value Date   WBC 9.4 11/29/2013   HGB 15.1* 04/07/2015   HCT 48.1* 11/29/2013   MCV 89.4 11/29/2013   PLT 291 11/29/2013   Lab Results  Component Value Date   CREATININE 0.57 11/29/2013   BUN 7 11/29/2013   NA 143 11/29/2013   K 4.3 11/29/2013   CL 104 11/29/2013   CO2 27 11/29/2013    Anesthesia Physical Anesthesia Plan  ASA: II  Anesthesia Plan: General   Post-op Pain Management:    Induction: Intravenous  Airway Management Planned: LMA  Additional Equipment:   Intra-op Plan:   Post-operative Plan: Extubation in OR  Informed Consent: I have reviewed the patients History and Physical, chart, labs and discussed the procedure including the risks, benefits and alternatives for the proposed  anesthesia with the patient or authorized representative who has indicated his/her understanding and acceptance.   Dental advisory given  Plan Discussed with: CRNA and Surgeon  Anesthesia Plan Comments: (Offered pt PNB. Pt declined pre-op block and opted for post-op block if pain inadequately controlled.)       Anesthesia Quick Evaluation

## 2015-04-07 ENCOUNTER — Ambulatory Visit (HOSPITAL_COMMUNITY): Payer: Self-pay

## 2015-04-07 ENCOUNTER — Ambulatory Visit (HOSPITAL_COMMUNITY): Payer: Self-pay | Admitting: Certified Registered"

## 2015-04-07 ENCOUNTER — Ambulatory Visit (HOSPITAL_COMMUNITY)
Admission: RE | Admit: 2015-04-07 | Discharge: 2015-04-07 | Disposition: A | Discharge status: Home / Self Care | Payer: Self-pay | Source: Ambulatory Visit | Attending: Orthopedic Surgery | Admitting: Orthopedic Surgery

## 2015-04-07 ENCOUNTER — Encounter (HOSPITAL_COMMUNITY)
Admission: RE | Disposition: A | Discharge status: Home / Self Care | Payer: Self-pay | Source: Ambulatory Visit | Attending: Orthopedic Surgery

## 2015-04-07 ENCOUNTER — Encounter (HOSPITAL_COMMUNITY): Payer: Self-pay | Admitting: *Deleted

## 2015-04-07 DIAGNOSIS — K219 Gastro-esophageal reflux disease without esophagitis: Secondary | ICD-10-CM | POA: Insufficient documentation

## 2015-04-07 DIAGNOSIS — X501XXA Overexertion from prolonged static or awkward postures, initial encounter: Secondary | ICD-10-CM | POA: Insufficient documentation

## 2015-04-07 DIAGNOSIS — S62619A Displaced fracture of proximal phalanx of unspecified finger, initial encounter for closed fracture: Secondary | ICD-10-CM

## 2015-04-07 DIAGNOSIS — F17210 Nicotine dependence, cigarettes, uncomplicated: Secondary | ICD-10-CM | POA: Insufficient documentation

## 2015-04-07 DIAGNOSIS — Z79899 Other long term (current) drug therapy: Secondary | ICD-10-CM | POA: Insufficient documentation

## 2015-04-07 DIAGNOSIS — J449 Chronic obstructive pulmonary disease, unspecified: Secondary | ICD-10-CM | POA: Insufficient documentation

## 2015-04-07 DIAGNOSIS — S62614A Displaced fracture of proximal phalanx of right ring finger, initial encounter for closed fracture: Secondary | ICD-10-CM | POA: Insufficient documentation

## 2015-04-07 DIAGNOSIS — I10 Essential (primary) hypertension: Secondary | ICD-10-CM | POA: Insufficient documentation

## 2015-04-07 DIAGNOSIS — M1990 Unspecified osteoarthritis, unspecified site: Secondary | ICD-10-CM | POA: Insufficient documentation

## 2015-04-07 DIAGNOSIS — E669 Obesity, unspecified: Secondary | ICD-10-CM | POA: Insufficient documentation

## 2015-04-07 HISTORY — DX: Essential (primary) hypertension: I10

## 2015-04-07 HISTORY — DX: Gastro-esophageal reflux disease without esophagitis: K21.9

## 2015-04-07 HISTORY — DX: Headache, unspecified: R51.9

## 2015-04-07 HISTORY — DX: Family history of other specified conditions: Z84.89

## 2015-04-07 HISTORY — DX: Headache: R51

## 2015-04-07 HISTORY — PX: OPEN REDUCTION INTERNAL FIXATION (ORIF) PROXIMAL PHALANX: SHX6235

## 2015-04-07 LAB — HEMOGLOBIN: HEMOGLOBIN: 15.1 g/dL — AB (ref 12.0–15.0)

## 2015-04-07 LAB — BASIC METABOLIC PANEL
Anion gap: 7 (ref 5–15)
BUN: 12 mg/dL (ref 6–20)
CALCIUM: 8.8 mg/dL — AB (ref 8.9–10.3)
CO2: 27 mmol/L (ref 22–32)
Chloride: 106 mmol/L (ref 101–111)
Creatinine, Ser: 0.73 mg/dL (ref 0.44–1.00)
GFR calc Af Amer: >60 mL/min (ref >60)
Glucose, Bld: 107 mg/dL — ABNORMAL HIGH (ref 65–99)
POTASSIUM: 4 mmol/L (ref 3.5–5.1)
SODIUM: 140 mmol/L (ref 135–145)

## 2015-04-07 SURGERY — OPEN REDUCTION INTERNAL FIXATION (ORIF) PROXIMAL PHALANX
Anesthesia: General | Site: Hand | Laterality: Right

## 2015-04-07 MED ORDER — PROPOFOL 10 MG/ML IV BOLUS
INTRAVENOUS | Status: DC | PRN
  Administered 2015-04-07: 150 mg via INTRAVENOUS

## 2015-04-07 MED ORDER — LIDOCAINE HCL (CARDIAC) 20 MG/ML IV SOLN
INTRAVENOUS | Status: DC | PRN
  Administered 2015-04-07: 20 mg via INTRAVENOUS

## 2015-04-07 MED ORDER — KETOROLAC TROMETHAMINE 30 MG/ML IJ SOLN
30.0000 mg | Freq: Once | INTRAMUSCULAR | Status: DC
Start: 2015-04-07 — End: 2015-04-07

## 2015-04-07 MED ORDER — ROCURONIUM BROMIDE 50 MG/5ML IV SOLN
INTRAVENOUS | Status: AC
  Filled 2015-04-07: qty 1

## 2015-04-07 MED ORDER — MIDAZOLAM HCL 5 MG/5ML IJ SOLN
INTRAMUSCULAR | Status: DC | PRN
  Administered 2015-04-07: 2 mg via INTRAVENOUS

## 2015-04-07 MED ORDER — SODIUM CHLORIDE 0.9 % IR SOLN
Status: DC | PRN
  Administered 2015-04-07 (×2): 1000 mL

## 2015-04-07 MED ORDER — MIDAZOLAM HCL 2 MG/2ML IJ SOLN
INTRAMUSCULAR | Status: AC
  Filled 2015-04-07: qty 2

## 2015-04-07 MED ORDER — OXYCODONE-ACETAMINOPHEN 10-325 MG PO TABS: 1.0000 | ORAL_TABLET | Freq: Four times a day (QID) | ORAL | Status: DC | PRN

## 2015-04-07 MED ORDER — BUPIVACAINE-EPINEPHRINE (PF) 0.5% -1:200000 IJ SOLN
INTRAMUSCULAR | Status: DC | PRN
  Administered 2015-04-07: 30 mL via PERINEURAL

## 2015-04-07 MED ORDER — SUCCINYLCHOLINE CHLORIDE 20 MG/ML IJ SOLN
INTRAMUSCULAR | Status: AC
Start: 2015-04-07 — End: 2015-04-07
  Filled 2015-04-07: qty 1

## 2015-04-07 MED ORDER — ONDANSETRON HCL 4 MG/2ML IJ SOLN
INTRAMUSCULAR | Status: AC
  Filled 2015-04-07: qty 2

## 2015-04-07 MED ORDER — SODIUM CHLORIDE 0.9 % IJ SOLN
INTRAMUSCULAR | Status: AC
  Filled 2015-04-07: qty 10

## 2015-04-07 MED ORDER — NEOSTIGMINE METHYLSULFATE 10 MG/10ML IV SOLN
INTRAVENOUS | Status: AC
  Filled 2015-04-07: qty 1

## 2015-04-07 MED ORDER — HYDROMORPHONE HCL 1 MG/ML IJ SOLN
0.2500 mg | INTRAMUSCULAR | Status: DC | PRN
  Administered 2015-04-07 (×3): 0.5 mg via INTRAVENOUS

## 2015-04-07 MED ORDER — PROPOFOL 10 MG/ML IV BOLUS
INTRAVENOUS | Status: AC
  Filled 2015-04-07: qty 20

## 2015-04-07 MED ORDER — BUPIVACAINE HCL (PF) 0.25 % IJ SOLN
INTRAMUSCULAR | Status: DC | PRN
  Administered 2015-04-07: 30 mL

## 2015-04-07 MED ORDER — LIDOCAINE HCL (CARDIAC) 20 MG/ML IV SOLN
INTRAVENOUS | Status: AC
  Filled 2015-04-07: qty 5

## 2015-04-07 MED ORDER — DEXAMETHASONE SODIUM PHOSPHATE 4 MG/ML IJ SOLN
INTRAMUSCULAR | Status: AC
  Filled 2015-04-07: qty 1

## 2015-04-07 MED ORDER — HYDROMORPHONE HCL 1 MG/ML IJ SOLN
INTRAMUSCULAR | Status: DC
Start: 2015-04-07 — End: 2015-04-07
  Filled 2015-04-07: qty 1

## 2015-04-07 MED ORDER — FENTANYL CITRATE (PF) 100 MCG/2ML IJ SOLN
INTRAMUSCULAR | Status: DC | PRN
  Administered 2015-04-07: 50 ug via INTRAVENOUS
  Administered 2015-04-07 (×4): 25 ug via INTRAVENOUS
  Administered 2015-04-07 (×2): 50 ug via INTRAVENOUS

## 2015-04-07 MED ORDER — FENTANYL CITRATE (PF) 250 MCG/5ML IJ SOLN
INTRAMUSCULAR | Status: AC
  Filled 2015-04-07: qty 5

## 2015-04-07 MED ORDER — CLINDAMYCIN PHOSPHATE 900 MG/50ML IV SOLN
INTRAVENOUS | Status: AC
  Administered 2015-04-07: 900 mg via INTRAVENOUS
  Filled 2015-04-07: qty 50

## 2015-04-07 MED ORDER — PROMETHAZINE HCL 25 MG/ML IJ SOLN: 6.2500 mg | INTRAMUSCULAR | Status: DC | PRN

## 2015-04-07 MED ORDER — ONDANSETRON HCL 4 MG/2ML IJ SOLN
INTRAMUSCULAR | Status: DC | PRN
  Administered 2015-04-07: 4 mg via INTRAVENOUS

## 2015-04-07 MED ORDER — EPHEDRINE SULFATE 50 MG/ML IJ SOLN
INTRAMUSCULAR | Status: DC | PRN
  Administered 2015-04-07: 5 mg via INTRAVENOUS

## 2015-04-07 MED ORDER — PHENYLEPHRINE 40 MCG/ML (10ML) SYRINGE FOR IV PUSH (FOR BLOOD PRESSURE SUPPORT)
PREFILLED_SYRINGE | INTRAVENOUS | Status: AC
  Filled 2015-04-07: qty 10

## 2015-04-07 MED ORDER — OXYCODONE HCL 5 MG/5ML PO SOLN: 5.0000 mg | Freq: Once | ORAL | Status: DC | PRN

## 2015-04-07 MED ORDER — GLYCOPYRROLATE 0.2 MG/ML IJ SOLN
INTRAMUSCULAR | Status: AC
Start: 2015-04-07 — End: 2015-04-07
  Filled 2015-04-07: qty 4

## 2015-04-07 MED ORDER — BUPIVACAINE HCL (PF) 0.25 % IJ SOLN
INTRAMUSCULAR | Status: AC
  Filled 2015-04-07: qty 30

## 2015-04-07 MED ORDER — DEXAMETHASONE SODIUM PHOSPHATE 4 MG/ML IJ SOLN
INTRAMUSCULAR | Status: DC | PRN
  Administered 2015-04-07: 4 mg via INTRAVENOUS

## 2015-04-07 MED ORDER — LACTATED RINGERS IV SOLN
INTRAVENOUS | Status: DC | PRN
  Administered 2015-04-07: 07:00:00 via INTRAVENOUS

## 2015-04-07 MED ORDER — EPHEDRINE SULFATE 50 MG/ML IJ SOLN
INTRAMUSCULAR | Status: AC
  Filled 2015-04-07: qty 1

## 2015-04-07 MED ORDER — OXYCODONE HCL 5 MG PO TABS: 5.0000 mg | ORAL_TABLET | Freq: Once | ORAL | Status: DC | PRN

## 2015-04-07 SURGICAL SUPPLY — 91 items
BANDAGE ELASTIC 3 VELCRO ST LF (GAUZE/BANDAGES/DRESSINGS) ×2 IMPLANT
BANDAGE ELASTIC 4 VELCRO ST LF (GAUZE/BANDAGES/DRESSINGS) ×2 IMPLANT
BIT DRILL 1.1 (BIT) ×1
BIT DRILL 2 FAST STEP (BIT) ×2 IMPLANT
BIT DRILL 60X20X1.1XQC TMX (BIT) ×1 IMPLANT
BIT DRL 60X20X1.1XQC TMX (BIT) ×1
BLADE SURG 10 STRL SS (BLADE) ×2 IMPLANT
BNDG ESMARK 4X9 LF (GAUZE/BANDAGES/DRESSINGS) ×2 IMPLANT
BNDG GAUZE ELAST 4 BULKY (GAUZE/BANDAGES/DRESSINGS) IMPLANT
CANISTER SUCT 3000ML PPV (MISCELLANEOUS) ×2 IMPLANT
CORDS BIPOLAR (ELECTRODE) ×2 IMPLANT
COVER SURGICAL LIGHT HANDLE (MISCELLANEOUS) ×2 IMPLANT
CUFF TOURNIQUET SINGLE 18IN (TOURNIQUET CUFF) ×2 IMPLANT
CUFF TOURNIQUET SINGLE 24IN (TOURNIQUET CUFF) IMPLANT
DRAIN TLS ROUND 10FR (DRAIN) IMPLANT
DRAPE INCISE IOBAN 66X45 STRL (DRAPES) IMPLANT
DRAPE OEC MINIVIEW 54X84 (DRAPES) IMPLANT
DRAPE U-SHAPE 47X51 STRL (DRAPES) ×2 IMPLANT
DRSG PAD ABDOMINAL 8X10 ST (GAUZE/BANDAGES/DRESSINGS) IMPLANT
DURAPREP 26ML APPLICATOR (WOUND CARE) ×2 IMPLANT
ELECT REM PT RETURN 9FT ADLT (ELECTROSURGICAL) ×2
ELECTRODE REM PT RTRN 9FT ADLT (ELECTROSURGICAL) ×1 IMPLANT
FACESHIELD WRAPAROUND (MASK) ×2 IMPLANT
GAUZE SPONGE 4X4 12PLY STRL (GAUZE/BANDAGES/DRESSINGS) ×2 IMPLANT
GAUZE XEROFORM 1X8 LF (GAUZE/BANDAGES/DRESSINGS) ×2 IMPLANT
GLOVE BIO SURGEON ST LM GN SZ9 (GLOVE) ×2 IMPLANT
GLOVE BIOGEL PI IND STRL 7.5 (GLOVE) ×1 IMPLANT
GLOVE BIOGEL PI IND STRL 8 (GLOVE) ×2 IMPLANT
GLOVE BIOGEL PI IND STRL 8.5 (GLOVE) ×1 IMPLANT
GLOVE BIOGEL PI INDICATOR 7.5 (GLOVE) ×1
GLOVE BIOGEL PI INDICATOR 8 (GLOVE) ×2
GLOVE BIOGEL PI INDICATOR 8.5 (GLOVE) ×1
GLOVE ECLIPSE 7.0 STRL STRAW (GLOVE) ×2 IMPLANT
GLOVE SURG ORTHO 8.0 STRL STRW (GLOVE) ×6 IMPLANT
GLOVE XGUARD RR 2 7.5 (GLOVE) ×1 IMPLANT
GLOVE XGUARD RR2 7.5 (GLOVE) ×2
GOWN STRL REUS W/ TWL LRG LVL3 (GOWN DISPOSABLE) ×3 IMPLANT
GOWN STRL REUS W/ TWL XL LVL3 (GOWN DISPOSABLE) ×2 IMPLANT
GOWN STRL REUS W/TWL LRG LVL3 (GOWN DISPOSABLE) ×3
GOWN STRL REUS W/TWL XL LVL3 (GOWN DISPOSABLE) ×2
K-WIRE SMTH SNGL TROCAR .035X9 ×4 IMPLANT
KIT BASIN OR (CUSTOM PROCEDURE TRAY) ×2 IMPLANT
KIT ROOM TURNOVER OR (KITS) ×2 IMPLANT
KWIRE SMTH SNGL TROCAR .035X9 ×2 IMPLANT
LOCK SCREW 1.5X15MM (Screw) ×2 IMPLANT
LOCK SCREW 1.5X9MM (Screw) ×2 IMPLANT
MANIFOLD NEPTUNE II (INSTRUMENTS) ×2 IMPLANT
NEEDLE 22X1 1/2 (OR ONLY) (NEEDLE) ×2 IMPLANT
NS IRRIG 1000ML POUR BTL (IV SOLUTION) ×2 IMPLANT
PACK ORTHO EXTREMITY (CUSTOM PROCEDURE TRAY) ×2 IMPLANT
PAD ARMBOARD 7.5X6 YLW CONV (MISCELLANEOUS) ×4 IMPLANT
PAD CAST 3X4 CTTN HI CHSV (CAST SUPPLIES) ×1 IMPLANT
PAD CAST 4YDX4 CTTN HI CHSV (CAST SUPPLIES) ×1 IMPLANT
PADDING CAST COTTON 3X4 STRL (CAST SUPPLIES) ×1
PADDING CAST COTTON 4X4 STRL (CAST SUPPLIES) ×1
PLATE T SMALL 1.5 131220157 (Plate) ×2 IMPLANT
PUTTY DBM STAGRAFT 2CC (Putty) ×2 IMPLANT
SCREW 1.5X14 LOCKING 131220414 (Screw) ×4 IMPLANT
SCREW CORTICAL 1.5X9MM WRIST (Screw) ×2 IMPLANT
SCREW LOCK 1.5X13 131220413 (Screw) ×2 IMPLANT
SCREW LOCK 1.5X15MM (Screw) ×1 IMPLANT
SCREW LOCK 1.5X9MM (Screw) ×1 IMPLANT
SCREW LOCKING 1.5X10 (Screw) ×2 IMPLANT
SCREW LOCKING 1.5X11MM (Screw) ×2 IMPLANT
SCREW NL 1.5X11 WRIST (Screw) ×4 IMPLANT
SCREW NON LOCK 1.5X13 13122051 (Screw) ×2 IMPLANT
SCREW NONIOC 1.5 16M (Screw) ×2 IMPLANT
SLING ARM FOAM STRAP LRG (SOFTGOODS) ×2 IMPLANT
SPONGE GAUZE 4X4 12PLY STER LF (GAUZE/BANDAGES/DRESSINGS) ×2 IMPLANT
SPONGE LAP 18X18 X RAY DECT (DISPOSABLE) ×2 IMPLANT
SPONGE LAP 4X18 X RAY DECT (DISPOSABLE) ×4 IMPLANT
STAPLER VISISTAT 35W (STAPLE) IMPLANT
STRIP CLOSURE SKIN 1/2X4 (GAUZE/BANDAGES/DRESSINGS) ×2 IMPLANT
SUCTION FRAZIER HANDLE 10FR (MISCELLANEOUS) ×1
SUCTION FRAZIER TIP 10 FR DISP (SUCTIONS) ×2 IMPLANT
SUCTION TUBE FRAZIER 10FR DISP (MISCELLANEOUS) ×1 IMPLANT
SUT ETHILON 3 0 PS 1 (SUTURE) ×2 IMPLANT
SUT FIBERWIRE 4-0 18 DIAM BLUE (SUTURE) ×4
SUT PROLENE 3 0 PS 1 (SUTURE) IMPLANT
SUT VIC AB 2-0 CTB1 (SUTURE) IMPLANT
SUT VIC AB 3-0 X1 27 (SUTURE) IMPLANT
SUT VIC AB 4-0 P-3 18X BRD (SUTURE) ×2 IMPLANT
SUT VIC AB 4-0 P3 18 (SUTURE) ×2
SUTURE FIBERWR 4-0 18 DIA BLUE (SUTURE) ×2 IMPLANT
SYR CONTROL 10ML LL (SYRINGE) ×2 IMPLANT
SYSTEM CHEST DRAIN TLS 7FR (DRAIN) IMPLANT
TOWEL OR 17X24 6PK STRL BLUE (TOWEL DISPOSABLE) ×2 IMPLANT
TOWEL OR 17X26 10 PK STRL BLUE (TOWEL DISPOSABLE) ×2 IMPLANT
TUBE CONNECTING 12X1/4 (SUCTIONS) ×2 IMPLANT
WATER STERILE IRR 1000ML POUR (IV SOLUTION) ×2 IMPLANT
YANKAUER SUCT BULB TIP NO VENT (SUCTIONS) IMPLANT

## 2015-04-07 NOTE — Op Note (Signed)
[name], [name]              ACCOUNT NO.:  [id number]  MEDICAL RECORD NO.:  [id number]  LOCATION:  [facility]                         FACILITY:  [facility]  PHYSICIAN:  Burnard BuntingG. Scott Schuyler Olden, M.D.    DATE OF BIRTH:  April 11, 1960  DATE OF PROCEDURE: DATE OF DISCHARGE:  04/07/2015                              OPERATIVE REPORT   PREOPERATIVE DIAGNOSIS:  Right hand fourth proximal phalanx fracture.  POSTOPERATIVE DIAGNOSIS:  Right hand fourth proximal phalanx fracture.  PROCEDURE:  Right hand fourth proximal phalanx fracture open reduction and internal fixation with bone grafting and elevation of articular fragment die-punch type lesion.  SURGEON:  Burnard BuntingG. Scott Jamilia Jacques, M.D.  ASSISTANT:  Patrick Jupiterarla Bethune, RNFA.  INDICATIONS:  Brianna MarshallKaren [name] is a 55 year old patient with right fourth proximal phalanx fracture, presents now for operative management after explanation of risks and benefits.  PROCEDURE IN DETAIL:  The patient was brought to the operating room where general anesthetic was induced.  Preoperative antibiotics were administered.  Time-out was called.  Right hand was prescrubbed with alcohol and Betadine, allowed to air dry, prepped with DuraPrep solution and draped in a sterile manner.  Longitudinal incision was made over the proximal phalanx of the fourth finger.  Skin and subcutaneous tissue were sharply divided.  Extensor mechanism was then divided and the periosteum was then divided off the phalanx half of the proximal phalanx.  Fracture was identified, die-punch fragment was identified. Drill hole was made 2 mm distal to the die-punch area.  This was then elevated down and bone grafted.  A Biomet plate was then fashioned with 1 nonlocking screw placed distally and the rest of the screws were placed locking screws in order to stabilize the fracture in the elevated articular segment.  At this time, thorough irrigation was performed. Instruments were removed.  The tourniquet was released.   Bleeding points were encountered and controlled using bipolar electrocautery.  The screw lengths were checked under fluoroscopy in the AP and lateral planes. The periosteum was closed over the plate using 4-0 Vicryl suture.  The split extensor mechanism was then closed using 4-0 FiberWire, skin closed using 4-0 Vicryl and 3-0 nylon.  Well-padded splint was placed with the fourth and fifth metacarpals in 90-90 position.  This was done because she already had some intrinsic tightness.  Plan is for splinting for several days followed by mobilization in a hand based splint.    Burnard BuntingG. Scott Issa Luster, M.D.    GSD/MEDQ  D:  04/07/2015  T:  04/07/2015  Job:  [id number]

## 2015-04-07 NOTE — Progress Notes (Signed)
Report given to david rn as caregiver 
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
## 2015-04-07 NOTE — Brief Op Note (Signed)
04/07/2015  9:51 AM  PATIENT:  [name]  55 y.o. female  PRE-OPERATIVE DIAGNOSIS:  RIGHT HAND FRACTURE  POST-OPERATIVE DIAGNOSIS:  RIGHT HAND FRACTURE  PROCEDURE:  Procedure(s): OPEN REDUCTION INTERNAL FIXATION (ORIF) PROXIMAL PHALANX  SURGEON:  Surgeon(s): Cammy CopaScott Gregory Dean, MD  ASSISTANT: Patrick Jupiterarla Bethune RNFA  ANESTHESIA:   general  EBL: 15 ml    Total I/O In: 400 [I.V.:400] Out: -   BLOOD ADMINISTERED: none  DRAINS: none   LOCAL MEDICATIONS USED:  marcaine  SPECIMEN:  No Specimen  COUNTS:  YES  TOURNIQUET:   Total Tourniquet Time Documented: Upper Arm (Right) - 66 minutes Total: Upper Arm (Right) - 66 minutes   DICTATION: .Other Dictation: Dictation Number [id number]  PLAN OF CARE: Discharge to home after PACU  PATIENT DISPOSITION:  PACU - hemodynamically stable

## 2015-04-07 NOTE — Transfer of Care (Signed)
Immediate Anesthesia Transfer of Care Note  Patient: [name]  Procedure(s) Performed: Procedure(s): OPEN REDUCTION INTERNAL FIXATION (ORIF) PROXIMAL PHALANX (Right)  Patient Location: PACU  Anesthesia Type:General  Level of Consciousness: awake, alert , oriented and patient cooperative  Airway & Oxygen Therapy: Patient Spontanous Breathing and Patient connected to nasal cannula oxygen  Post-op Assessment: Report given to RN, Post -op Vital signs reviewed and stable and Patient moving all extremities X 4  Post vital signs: Reviewed and stable  Last Vitals:  Filed Vitals:   04/07/15 0606  BP: 123/75  Pulse: 65  Temp: 36.4 C  Resp: 20    Complications: No apparent anesthesia complications

## 2015-04-07 NOTE — Anesthesia Procedure Notes (Addendum)
Procedure Name: LMA Insertion Date/Time: 04/07/2015 7:41 AM Performed by: Fabian NovemberSOLHEIM, JOSHUA SALOMAN Pre-anesthesia Checklist: Patient identified, Patient being monitored, Timeout performed, Emergency Drugs available and Suction available Patient Re-evaluated:Patient Re-evaluated prior to inductionOxygen Delivery Method: Circle System Utilized Preoxygenation: Pre-oxygenation with 100% oxygen Intubation Type: IV induction Ventilation: Mask ventilation without difficulty LMA: LMA inserted LMA Size: 4.0 Number of attempts: 1 Placement Confirmation: positive ETCO2 and breath sounds checked- equal and bilateral Tube secured with: Tape Dental Injury: Teeth and Oropharynx as per pre-operative assessment     Anesthesia Regional Block:  Axillary brachial plexus block  Pre-Anesthetic Checklist: ,, timeout performed, Correct Patient, Correct Site, Correct Laterality, Correct Procedure, Correct Position, site marked, Risks and benefits discussed,  Surgical consent,  Pre-op evaluation,  At surgeon's request and post-op pain management  Laterality: Right  Prep: chloraprep       Needles:   Needle Type: Echogenic Stimulator Needle     Needle Length: 9cm 9 cm Needle Gauge: 21 and 21 G    Additional Needles:  Procedures: ultrasound guided (picture in chart) and nerve stimulator Axillary brachial plexus block  Nerve Stimulator or Paresthesia:  Response: musculocutaneous, 0.5 mA,  Response: radial, 0.5 mA,  Response: median, 0.5 mA,   Additional Responses:  Ulnar response at 0.338mA Narrative:  Start time: 04/07/2015 10:00 AM End time: 04/07/2015 10:16 AM Injection made incrementally with aspirations every 5 mL.  Performed by: Personally  Anesthesiologist: Marcene DuosFITZGERALD, Zaidyn Claire  Additional Notes: Risks and benefits discussed prior to sedation and general anesthesia. Pt had consented to post-op block if pain inadequately controlled. Pt tolerated well with no immediate  complications.

## 2015-04-07 NOTE — Anesthesia Postprocedure Evaluation (Signed)
Anesthesia Post Note  Patient: [name]  Procedure(s) Performed: Procedure(s) (LRB): OPEN REDUCTION INTERNAL FIXATION (ORIF) PROXIMAL PHALANX (Right)  Patient location during evaluation: PACU Anesthesia Type: General and Regional Level of consciousness: awake and alert Pain management: pain level controlled Vital Signs Assessment: post-procedure vital signs reviewed and stable Respiratory status: spontaneous breathing Cardiovascular status: blood pressure returned to baseline Anesthetic complications: no    Last Vitals:  Filed Vitals:   04/07/15 1100 04/07/15 1126  BP:  111/74  Pulse: 72 68  Temp: 36.8 C   Resp: 14     Last Pain:  Filed Vitals:   04/07/15 1127  PainSc: 0-No pain                 Kennieth RadFitzgerald, Shealeigh Dunstan E

## 2015-04-08 ENCOUNTER — Encounter (HOSPITAL_COMMUNITY): Payer: Self-pay | Admitting: Orthopedic Surgery

## 2015-04-10 ENCOUNTER — Encounter (HOSPITAL_COMMUNITY): Payer: Self-pay | Admitting: Orthopedic Surgery

## 2015-04-15 ENCOUNTER — Ambulatory Visit: Payer: Self-pay | Attending: Orthopedic Surgery | Admitting: Occupational Therapy

## 2015-04-15 DIAGNOSIS — M25641 Stiffness of right hand, not elsewhere classified: Secondary | ICD-10-CM | POA: Insufficient documentation

## 2015-04-15 DIAGNOSIS — M79641 Pain in right hand: Secondary | ICD-10-CM | POA: Insufficient documentation

## 2015-04-15 DIAGNOSIS — R29898 Other symptoms and signs involving the musculoskeletal system: Secondary | ICD-10-CM

## 2015-04-15 DIAGNOSIS — M6289 Other specified disorders of muscle: Secondary | ICD-10-CM | POA: Insufficient documentation

## 2015-04-15 NOTE — Patient Instructions (Addendum)
WEARING SCHEDULE:  Wear splint at ALL times except for hygiene care (May remove splint for exercises and then immediately place back on ONLY if directed by the therapist)  PURPOSE:  To prevent movement and for protection until injury can heal  CARE OF SPLINT:  Keep splint away from heat sources including: stove, radiator or furnace, or a car in sunlight. The splint can melt and will no longer fit you properly  Keep away from pets and children  Clean the splint with rubbing alcohol 1-2 times per day.  * During this time, make sure you also clean your hand/arm as instructed by your therapist and/or perform dressing changes as needed. Then dry hand/arm completely before replacing splint. (When cleaning hand/arm, keep it immobilized in same position until splint is replaced)  PRECAUTIONS/POTENTIAL PROBLEMS: *If you notice or experience increased pain, swelling, numbness, or a lingering reddened area from the splint: Contact your therapist immediately by calling [phone]. You must wear the splint for protection, but we will get you scheduled for adjustments as quickly as possible.  (If only straps or hooks need to be replaced and NO adjustments to the splint need to be made, just call the office ahead and let them know you are coming in)  If you have any medical concerns or signs of infection, please call your doctor immediately          MP Flexion (Active Isolated)   Bend __ring and small____ finger at large knuckle, keeping other fingers straight, then straighten assisting with other handDo not bend tips. Repeat _10-15___ times. Do __4-6__ sessions per day.  AROM: PIP Flexion / Extension   Pinch bottom knuckle of ___ring, small_____ finger of hand to prevent bending. Actively bend middle knuckle until stretch is felt. Hold __5__ seconds. Relax. Straighten finger assisting with other hand  as far as possible. Repeat __10-15__ times per set. Do _4-6___ sessions per day.   AROM:  DIP Flexion / Extension   Pinch middle knuckle of __ring and small______ finger of  hand to prevent bending. Bend end knuckle until stretch is felt. Hold _5___ seconds. Relax. Straighten finger as far as possible, assist with other hand. Repeat _10-15___ times per set.  Do _4-6___ sessions per day.  AROM: Finger Flexion / Extension   Actively bend fingers of  hand. Start with knuckles furthest from palm, and slowly make a fist. Hold __5__ seconds. Relax. Then straighten fingers as far as possible assisting with other hand. Repeat _10-15___ times per set.  Do _4-6___ sessions per day.  Copyright  VHI. All rights reserved.
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
## 2015-04-17 ENCOUNTER — Ambulatory Visit: Payer: Self-pay | Admitting: Occupational Therapy

## 2015-04-17 DIAGNOSIS — M79641 Pain in right hand: Secondary | ICD-10-CM

## 2015-04-17 DIAGNOSIS — M25641 Stiffness of right hand, not elsewhere classified: Secondary | ICD-10-CM

## 2015-04-17 DIAGNOSIS — R29898 Other symptoms and signs involving the musculoskeletal system: Secondary | ICD-10-CM

## 2015-04-17 NOTE — Therapy (Signed)
[facility] [address] [place], [place], [address] Phone: [phone]   Fax:  [phone]  Occupational Therapy Treatment  Patient Details  Name: [name] MRN: [id number] Date of Birth: 10/22/1960 Referring Provider: Dr. August Saucer  Encounter Date: 04/17/2015      OT End of Session - 04/17/15 1537    Visit Number 2   Number of Visits 17   Date for OT Re-Evaluation 06/13/15   Authorization Type self pay   OT Start Time 1023   OT Stop Time 1100   OT Time Calculation (min) 37 min   Activity Tolerance Patient tolerated treatment well   Behavior During Therapy [facility] for tasks assessed/performed      Past Medical History  Diagnosis Date  . Arthritis   . COPD (chronic obstructive pulmonary disease) (HCC)   . Anxiety   . Palpitations   . Constipation   . Staph infection   . Gallbladder sludge   . Hypertension   . GERD (gastroesophageal reflux disease)   . Headache     used to have migraines  . Family history of adverse reaction to anesthesia     brother was "allergic" to anesthesia - heart stopped beating on the surgery table    Past Surgical History  Procedure Laterality Date  . Groin debridement      due to spider bite,   . Tonsillectomy    . Tubal ligation    . Open reduction internal fixation (orif) proximal phalanx Right 04/07/2015    Procedure: OPEN REDUCTION INTERNAL FIXATION (ORIF) PROXIMAL PHALANX;  Surgeon: Cammy Copa, MD;  Location: [facility] OR;  Service: Orthopedics;  Laterality: Right;    There were no vitals filed for this visit.  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 Yes   Pain Score 8    Pain Location Finger (Comment which one)   Pain Orientation Right   Pain Descriptors /  Indicators Aching   Pain Type Surgical pain   Pain Onset 1 to 4 weeks ago   Pain Frequency Constant   Aggravating Factors  movement   Pain Relieving Factors heat   Effect of Pain on Daily Activities l        Treatment:Hotpack applied to right hand x 10 mins while therapist performed minor adjustments at wrist portion of splint as pt reported it was rubbing her, Ultrasound to volar hand at ring and small finger for scar tissue mobilization, 3 MHZ, 0.8 w.cm 2,  20% x 8 mins, no adverse reactions Gentle AA/ROM finger flexion then P/ROM extension, pt with significant pain. NMES to finger flexors 50 pps, 250 pw, 10 secs cycle with pt performing A/ROM finger flexion during on cycle. No adverse reactions, yet pt unable to tolerate higher intensity. Pt reports splint feels good after adjustment and pt demonstrates improved ROM at end of session.                        OT Short Term Goals - 04/15/15 1532    OT SHORT TERM GOAL #1   Title I with splint wear, care and precautions following 1-2 weeks of wear to ensure proper fit.   Time 4   Period  Weeks   Status New   OT SHORT TERM GOAL #2   Title I with inital HEP.   Time 4   Period Weeks   Status New   OT SHORT TERM GOAL #3   Title Pt will increase MP flexion for ring and small finger by 15* for increased functional use.   Baseline ring: 60, small : 55   Time 4   Period Weeks   Status New   OT SHORT TERM GOAL #4   Title Pt will increase PIP flexion of ring and small finger by 15 for increased functional use.   Time 4   Period Weeks   Status New           OT Long Term Goals - 04/15/15 1535    OT LONG TERM GOAL #1   Title I with updated HEP.   Time 8   Period Weeks   Status New   OT LONG TERM GOAL #2   Title Pt will resume use of RUE as dominant hand at least 90% of the time for ADLs/ IADLs with pain less than or equal to 3/10.   Time 8   Period Weeks   Status New   OT LONG TERM GOAL #3   Title Pt will  demonstrate RUE grip strength of at least 30 lbs for increased functional use.   Time 8   Period Weeks   Status New   OT LONG TERM GOAL #4   Title Pt will demonstrate 80* or greater MP flexion for digits 4,5 for increased functional use.   Time 8   Period Weeks   Status New               Plan - 04/17/15 1533    Clinical Impression Statement Pt is progressing towards goals with improving A/ROM. Pt is responding well to modalities and splint.   Pt will benefit from skilled therapeutic intervention in order to improve on the following deficits (Retired) Decreased coordination;Decreased range of motion;Impaired flexibility;Impaired sensation;Increased edema;Decreased safety awareness;Decreased endurance;Decreased activity tolerance;Decreased knowledge of precautions;Pain;Impaired UE functional use;Decreased balance;Decreased cognition;Decreased strength   Rehab Potential Good   OT Frequency 2x / week   OT Duration 8 weeks   OT Treatment/Interventions Self-care/ADL training;Therapeutic exercise;Patient/family education;Splinting;Ultrasound;Neuromuscular education;Manual Therapy;Therapeutic exercises;Parrafin;DME and/or AE instruction;Therapeutic activities;Scar mobilization;Fluidtherapy;Electrical Stimulation;Moist Heat;Contrast Bath;Passive range of motion   Plan modailites, A/ROM, P/ROM- (emphasis on A/ROM flexion, and P/ROM extension)   Consulted and Agree with Plan of Care Patient        Problem List Patient Active Problem List   Diagnosis Date Noted  . Proximal phalanx fracture of finger 03/31/2015    Ksean Vale 04/17/2015, 3:38 PM Keene Breath, OTR/L [phone] Phone: [phone] 3:38 PM 04/17/2015 [facility] Rehabilitation [facility] [address] [place], [place], [address] Phone: [phone]   Fax:  [phone]  Name: [name] MRN: [id number] Date of Birth: [birth year]

## 2015-04-21 ENCOUNTER — Ambulatory Visit: Payer: Self-pay | Admitting: Occupational Therapy

## 2015-04-21 DIAGNOSIS — M79641 Pain in right hand: Secondary | ICD-10-CM

## 2015-04-21 DIAGNOSIS — R29898 Other symptoms and signs involving the musculoskeletal system: Secondary | ICD-10-CM

## 2015-04-21 DIAGNOSIS — M25641 Stiffness of right hand, not elsewhere classified: Secondary | ICD-10-CM

## 2015-04-21 NOTE — Therapy (Signed)
[facility] [address] [place], [place], [address] Phone: [phone]   Fax:  [phone]  Occupational Therapy Treatment  Patient Details  Name: [name] MRN: [id number] Date of Birth: 1961/01/13 Referring Provider: Dr. August Saucer  Encounter Date: 04/21/2015      OT End of Session - 04/21/15 0954    Visit Number 3   Number of Visits 17   Date for OT Re-Evaluation 06/13/15   Authorization Type self pay   OT Start Time 0933   OT Stop Time 1015   OT Time Calculation (min) 42 min   Activity Tolerance Patient tolerated treatment well      Past Medical History  Diagnosis Date  . Arthritis   . COPD (chronic obstructive pulmonary disease) (HCC)   . Anxiety   . Palpitations   . Constipation   . Staph infection   . Gallbladder sludge   . Hypertension   . GERD (gastroesophageal reflux disease)   . Headache     used to have migraines  . Family history of adverse reaction to anesthesia     brother was "allergic" to anesthesia - heart stopped beating on the surgery table    Past Surgical History  Procedure Laterality Date  . Groin debridement      due to spider bite,   . Tonsillectomy    . Tubal ligation    . Open reduction internal fixation (orif) proximal phalanx Right 04/07/2015    Procedure: OPEN REDUCTION INTERNAL FIXATION (ORIF) PROXIMAL PHALANX;  Surgeon: Cammy Copa, MD;  Location: [facility] OR;  Service: Orthopedics;  Laterality: Right;    There were no vitals filed for this visit.  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 No/denies   Pain Score 5    Pain Location Finger (Comment which one)   Pain Orientation Right   Pain  Descriptors / Indicators Aching   Pain Type Surgical pain   Pain Onset More than a month ago   Pain Frequency Constant   Aggravating Factors  movement   Pain Relieving Factors heat   Effect of Pain on Daily Activities limits functional use     Treatment:Hotpack applied to right hand x 8 mins, for stiffness and pain, no adverse reactions. Ultrasound to volar hand at ring and small finger for scar tissue mobilization, 3 MHZ, 0.8 w.cm 2,  20% x 8 mins, no adverse reactions Gentle AA/ROM finger flexion then P/ROM extension, pt with significant pain. PIP blocking, reverse blocking and DIP blocking exercises 10 reps each NMES to finger flexors 50 pps, 250 pw, 10 secs cycle intensisty 8, with pt performing A/ROM finger flexion exercise during on cycle. No adverse reactions, yet pt unable to tolerate higher intensity.                                             OT Short Term Goals - 04/15/15 1532    OT SHORT TERM GOAL #1   Title I with splint wear, care and precautions following 1-2 weeks of wear  to ensure proper fit.   Time 4   Period Weeks   Status New   OT SHORT TERM GOAL #2   Title I with inital HEP.   Time 4   Period Weeks   Status New   OT SHORT TERM GOAL #3   Title Pt will increase MP flexion for ring and small finger by 15* for increased functional use.   Baseline ring: 60, small : 55   Time 4   Period Weeks   Status New   OT SHORT TERM GOAL #4   Title Pt will increase PIP flexion of ring and small finger by 15 for increased functional use.   Time 4   Period Weeks   Status New           OT Long Term Goals - 04/15/15 1535    OT LONG TERM GOAL #1   Title I with updated HEP.   Time 8   Period Weeks   Status New   OT LONG TERM GOAL #2   Title Pt will resume use of RUE as dominant hand at least 90% of the time for ADLs/ IADLs with pain less than or equal to 3/10.   Time 8   Period Weeks   Status New   OT LONG TERM GOAL #3    Title Pt will demonstrate RUE grip strength of at least 30 lbs for increased functional use.   Time 8   Period Weeks   Status New   OT LONG TERM GOAL #4   Title Pt will demonstrate 80* or greater MP flexion for digits 4,5 for increased functional use.   Time 8   Period Weeks   Status New               Plan - 04/21/15 1610    Clinical Impression Statement Pt is progressing towards goals. she demonstrates improving A/ROM. Pt sees MD on 04/26/14, therapist to send note with patient.   Pt will benefit from skilled therapeutic intervention in order to improve on the following deficits (Retired) Decreased coordination;Decreased range of motion;Impaired flexibility;Impaired sensation;Increased edema;Decreased safety awareness;Decreased endurance;Decreased activity tolerance;Decreased knowledge of precautions;Pain;Impaired UE functional use;Decreased balance;Decreased cognition;Decreased strength   Rehab Potential Good   OT Frequency 2x / week   OT Duration 8 weeks   OT Treatment/Interventions Self-care/ADL training;Therapeutic exercise;Patient/family education;Splinting;Ultrasound;Neuromuscular education;Manual Therapy;Therapeutic exercises;Parrafin;DME and/or AE instruction;Therapeutic activities;Scar mobilization;Fluidtherapy;Electrical Stimulation;Moist Heat;Contrast Bath;Passive range of motion   Plan modalities, A/ROM, P/ROM   Consulted and Agree with Plan of Care Patient        Problem List Patient Active Problem List   Diagnosis Date Noted  . Proximal phalanx fracture of finger 03/31/2015    RINE,KATHRYN 04/21/2015, 9:55 AM Keene Breath, OTR/L [phone] Phone: [phone] 9:55 AM 04/21/2015 [facility] Rehabilitation [facility] [address] [place], [place], [address] Phone: [phone]   Fax:  [phone]  Name: [name] MRN: [id number] Date of Birth: [birth year]

## 2015-04-24 ENCOUNTER — Ambulatory Visit: Payer: Self-pay | Admitting: Occupational Therapy

## 2015-04-28 ENCOUNTER — Ambulatory Visit: Payer: Self-pay | Admitting: Occupational Therapy

## 2015-04-28 ENCOUNTER — Encounter: Payer: Self-pay | Admitting: Occupational Therapy

## 2015-04-28 DIAGNOSIS — M79641 Pain in right hand: Secondary | ICD-10-CM

## 2015-04-28 DIAGNOSIS — M25641 Stiffness of right hand, not elsewhere classified: Secondary | ICD-10-CM

## 2015-04-28 NOTE — Therapy (Signed)
[facility] [address] [place], [place], [address] Phone: [phone]   Fax:  [phone]  Occupational Therapy Treatment  Patient Details  Name: Korra Christine MRN: [id number] Date of Birth: [birth year] Referring Provider: Dr. August Saucer  Encounter Date: 04/28/2015      OT End of Session - 04/28/15 1141    Visit Number 4   Number of Visits 17   Date for OT Re-Evaluation 06/13/15   Authorization Type self pay   OT Start Time 0930   OT Stop Time 1025   OT Time Calculation (min) 55 min   Activity Tolerance Patient tolerated treatment well      Past Medical History  Diagnosis Date  . Arthritis   . COPD (chronic obstructive pulmonary disease) (HCC)   . Anxiety   . Palpitations   . Constipation   . Staph infection   . Gallbladder sludge   . Hypertension   . GERD (gastroesophageal reflux disease)   . Headache     used to have migraines  . Family history of adverse reaction to anesthesia     brother was "allergic" to anesthesia - heart stopped beating on the surgery table    Past Surgical History  Procedure Laterality Date  . Groin debridement      due to spider bite,   . Tonsillectomy    . Tubal ligation    . Open reduction internal fixation (orif) proximal phalanx Right 04/07/2015    Procedure: OPEN REDUCTION INTERNAL FIXATION (ORIF) PROXIMAL PHALANX;  Surgeon: Cammy Copa, MD;  [facility]: [facility] OR;  Service: Orthopedics;  Laterality: Right;    There were no vitals filed for this visit.  Visit Diagnosis:  Pain of right hand  Stiffness of finger joint of right hand      Subjective Assessment - 04/28/15 0948    Subjective  I saw the Dr. 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  No/denies                      OT Treatments/Exercises (OP) - 04/28/15 0001    Exercises   Exercises Hand   Hand Exercises   Other Hand Exercises A/ROM and P/ROM to Rt hand with emphasis on ring finger compositely and blocking to isolate each joint   Modalities   Modalities Fluidotherapy;Insurance account manager Location volar forearm (finger flexors)   Electrical Stimulation Action finger flexion   Electrical Stimulation Parameters 50 pps, 250 pw, 10 sec. on/off cycle x 10 mintues    Electrical Stimulation Goals --  ROM    RUE Fluidotherapy   Number Minutes Fluidotherapy 10 Minutes   RUE Fluidotherapy Location Hand   Comments near beginning of session to decrease stiffness with no adverse reactions   Splinting   Splinting Replaced all straps of splint and issued 2 new tensogrip. Also straightened splint to allow more IP extension and adjusted placement of finger strapping                  OT Short Term Goals - 04/15/15 1532    OT SHORT TERM GOAL #1   Title I with splint wear, care and precautions  following 1-2 weeks of wear to ensure proper fit.   Time 4   Period Weeks   Status New   OT SHORT TERM GOAL #2   Title I with inital HEP.   Time 4   Period Weeks   Status New   OT SHORT TERM GOAL #3   Title Pt will increase MP flexion for ring and small finger by 15* for increased functional use.   Baseline ring: 60, small : 55   Time 4   Period Weeks   Status New   OT SHORT TERM GOAL #4   Title Pt will increase PIP flexion of ring and small finger by 15 for increased functional use.   Time 4   Period Weeks   Status New           OT Long Term Goals - 04/15/15 1535    OT LONG TERM GOAL #1   Title I with updated HEP.   Time 8   Period Weeks   Status New   OT LONG TERM GOAL #2   Title Pt will resume use of RUE as dominant hand at least 90% of the time for ADLs/ IADLs with pain less than or equal  to 3/10.   Time 8   Period Weeks   Status New   OT LONG TERM GOAL #3   Title Pt will demonstrate RUE grip strength of at least 30 lbs for increased functional use.   Time 8   Period Weeks   Status New   OT LONG TERM GOAL #4   Title Pt will demonstrate 80* or greater MP flexion for digits 4,5 for increased functional use.   Time 8   Period Weeks   Status New               Plan - 04/28/15 1141    Clinical Impression Statement Pt progressing with increased A/ROM and P/ROM and tolerating estim at higher intensity today    Plan continue modalities, A/ROM, P/ROM   Consulted and Agree with Plan of Care Patient        Problem List Patient Active Problem List   Diagnosis Date Noted  . Proximal phalanx fracture of finger 03/31/2015    Kelli Churn, OTR/L  04/28/2015, 11:43 AM  [facility] [address] [place], [place], [address] Phone: [phone]   Fax:  [phone]  Name: Aniyiah Zell MRN: [id number] Date of Birth: Dec 24, 1960

## 2015-04-29 ENCOUNTER — Ambulatory Visit: Payer: Self-pay | Attending: Orthopedic Surgery | Admitting: Occupational Therapy

## 2015-04-29 DIAGNOSIS — M79641 Pain in right hand: Secondary | ICD-10-CM | POA: Insufficient documentation

## 2015-04-29 DIAGNOSIS — M25641 Stiffness of right hand, not elsewhere classified: Secondary | ICD-10-CM | POA: Insufficient documentation

## 2015-04-29 DIAGNOSIS — M6289 Other specified disorders of muscle: Secondary | ICD-10-CM | POA: Insufficient documentation

## 2015-05-05 ENCOUNTER — Ambulatory Visit: Payer: Self-pay | Admitting: Occupational Therapy

## 2015-05-12 ENCOUNTER — Ambulatory Visit: Payer: Self-pay | Admitting: Occupational Therapy

## 2015-05-12 ENCOUNTER — Telehealth: Payer: Self-pay | Admitting: Occupational Therapy

## 2015-05-12 DIAGNOSIS — R29898 Other symptoms and signs involving the musculoskeletal system: Secondary | ICD-10-CM

## 2015-05-12 DIAGNOSIS — M79641 Pain in right hand: Secondary | ICD-10-CM

## 2015-05-12 DIAGNOSIS — M25641 Stiffness of right hand, not elsewhere classified: Secondary | ICD-10-CM

## 2015-05-12 NOTE — Telephone Encounter (Signed)
Dr. August Saucer, [name] is receiving occupational therapy for her RUE. Her A/ROM is progressing well. We are performing A/ROM and P/ROM, using estim, fluido, and ultrasound.  She is approximately 5 weeks postop (surgery was 04/07/15) When can we d/c her splint? When can we progress to strengthening?  Please feel free to contact me with any questions. ThanksKeene Breath, OTR/L [phone] Phone: [phone] 10:16 AM 05/12/2015

## 2015-05-12 NOTE — Therapy (Signed)
[facility] [address] [place], [place], [address] Phone: [phone]   Fax:  [phone]  Occupational Therapy Treatment  Patient Details  Name: [name] MRN: [id number] Date of Birth: [birth year] Referring Provider: Dr. August Saucer  Encounter Date: 05/12/2015      OT End of Session - 05/12/15 1259    Visit Number 5   Number of Visits 17   Date for OT Re-Evaluation 06/13/15   Authorization Type self pay   OT Start Time 0935   OT Stop Time 1017   OT Time Calculation (min) 42 min      Past Medical History  Diagnosis Date  . Arthritis   . COPD (chronic obstructive pulmonary disease) (HCC)   . Anxiety   . Palpitations   . Constipation   . Staph infection   . Gallbladder sludge   . Hypertension   . GERD (gastroesophageal reflux disease)   . Headache     used to have migraines  . Family history of adverse reaction to anesthesia     brother was "allergic" to anesthesia - heart stopped beating on the surgery table    Past Surgical History  Procedure Laterality Date  . Groin debridement      due to spider bite,   . Tonsillectomy    . Tubal ligation    . Open reduction internal fixation (orif) proximal phalanx Right 04/07/2015    Procedure: OPEN REDUCTION INTERNAL FIXATION (ORIF) PROXIMAL PHALANX;  Surgeon: Cammy Copa, MD;  [facility] OR;  Service: Orthopedics;  Laterality: Right;    There were no vitals filed for this visit.  Visit Diagnosis:  Pain of right hand  Stiffness of finger joint of right hand  Weakness of right hand      Subjective Assessment - 05/12/15 1010    Subjective  My pain is better. 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 Yes   Pain Score 2    Pain Location Finger (Comment which one)   Pain Orientation Right   Pain Descriptors /  Indicators Aching   Pain Type Surgical pain   Pain Onset More than a month ago   Pain Frequency Intermittent   Aggravating Factors  movement   Pain Relieving Factors heat   Effect of Pain on Daily Activities limits movement          Treatment:fluidotherapy to  Right  hand x 12 mins for stiffness and pain, no adverse reactions Ultrasound to volar hand at ring and small finger for scar tissue mobilization, 3 MHZ, 0.8 w.cm 2,  20% x 6 mins, no adverse reactions Reverse blocking , then blocking for PIP flexion/ extension for ring and small fingers, P/ROM composite finger flexion/ extension and then A/ROM composite finger flexion extension . NMES to finger flexors 50 pps, 250 pw, 10 secs cycle with pt performing A/ROM finger flexion during on cycle intensity 9 . No adverse reactions                                          OT Short Term Goals - 04/15/15 1532    OT SHORT TERM GOAL #1   Title I with splint wear, care and precautions following 1-2  weeks of wear to ensure proper fit.   Time 4   Period Weeks   Status New   OT SHORT TERM GOAL #2   Title I with inital HEP.   Time 4   Period Weeks   Status New   OT SHORT TERM GOAL #3   Title Pt will increase MP flexion for ring and small finger by 15* for increased functional use.   Baseline ring: 60, small : 55   Time 4   Period Weeks   Status New   OT SHORT TERM GOAL #4   Title Pt will increase PIP flexion of ring and small finger by 15 for increased functional use.   Time 4   Period Weeks   Status New           OT Long Term Goals - 04/15/15 1535    OT LONG TERM GOAL #1   Title I with updated HEP.   Time 8   Period Weeks   Status New   OT LONG TERM GOAL #2   Title Pt will resume use of RUE as dominant hand at least 90% of the time for ADLs/ IADLs with pain less than or equal to 3/10.   Time 8   Period Weeks   Status New   OT LONG TERM GOAL #3   Title Pt will demonstrate RUE grip  strength of at least 30 lbs for increased functional use.   Time 8   Period Weeks   Status New   OT LONG TERM GOAL #4   Title Pt will demonstrate 80* or greater MP flexion for digits 4,5 for increased functional use.   Time 8   Period Weeks   Status New               Plan - 05/12/15 1256    Clinical Impression Statement Pt is progressing towards goals for A/ROM and P/ROM. Note sent to MD to clarify when pt may d/c splint and begin strengthening.   Pt will benefit from skilled therapeutic intervention in order to improve on the following deficits (Retired) Decreased coordination;Decreased range of motion;Impaired flexibility;Impaired sensation;Increased edema;Decreased safety awareness;Decreased endurance;Decreased activity tolerance;Decreased knowledge of precautions;Pain;Impaired UE functional use;Decreased balance;Decreased cognition;Decreased strength   Rehab Potential Good   OT Frequency 2x / week   OT Duration 8 weeks   OT Treatment/Interventions Self-care/ADL training;Therapeutic exercise;Patient/family education;Splinting;Ultrasound;Neuromuscular education;Manual Therapy;Therapeutic exercises;Parrafin;DME and/or AE instruction;Therapeutic activities;Scar mobilization;Fluidtherapy;Electrical Stimulation;Moist Heat;Contrast Bath;Passive range of motion   Plan modalities, a/ROM, P/ROM   Consulted and Agree with Plan of Care Patient        Problem List Patient Active Problem List   Diagnosis Date Noted  . Proximal phalanx fracture of finger 03/31/2015    Agnieszka Newhouse 05/12/2015, 12:59 PM Keene Breath, OTR/L [phone] Phone: [phone] 1:00 PM 05/12/2015 [facility] [address] [place], [place], [address] Phone: [phone]   Fax:  [phone]  Name: Laveda Demedeiros MRN: [id number] Date of Birth: 02-27-1961

## 2015-05-15 ENCOUNTER — Ambulatory Visit: Payer: Self-pay | Admitting: Occupational Therapy

## 2015-05-19 ENCOUNTER — Ambulatory Visit: Payer: Self-pay | Admitting: Occupational Therapy

## 2015-05-22 ENCOUNTER — Ambulatory Visit: Payer: Self-pay | Admitting: Occupational Therapy

## 2015-05-22 ENCOUNTER — Telehealth: Payer: Self-pay | Admitting: Occupational Therapy

## 2015-05-22 NOTE — Telephone Encounter (Signed)
Therapist called pt and left a message regarding pt's next appt. Is 05/26/15 at 8:45. Pt was confused today and arrived for an 8:45 appt. Instead of 2:45.

## 2015-05-26 ENCOUNTER — Ambulatory Visit: Payer: Self-pay | Admitting: Occupational Therapy

## 2015-05-26 DIAGNOSIS — M25641 Stiffness of right hand, not elsewhere classified: Secondary | ICD-10-CM

## 2015-05-26 DIAGNOSIS — M79641 Pain in right hand: Secondary | ICD-10-CM

## 2015-05-26 DIAGNOSIS — R29898 Other symptoms and signs involving the musculoskeletal system: Secondary | ICD-10-CM

## 2015-05-26 NOTE — Patient Instructions (Signed)
1. Grip Strengthening (Resistive Putty)   Squeeze putty using thumb and all fingers. Repeat 20 times. Do 2 sessions per day.   Extension (Assistive Putty)   Roll putty back and forth, being sure to use all fingertips. Repeat 3 times. Do 2 sessions per day.  Then pinch as below.   Palmar Pinch Strengthening (Resistive Putty)   Pinch putty between thumb and each fingertip in turn after rolling out    Finger and Thumb Extension (Resistive Putty)   With thumb and all fingers in center of putty donut, stretch out. Repeat 10 times. Do 2 sessions per day.      IP Fisting (Resistive Putty)   Keeping knuckles straight, bend fingertips to squeeze putty. Repeat 10 times. Do 2 sessions per day.  MP Flexion (Resistive Putty)   Bending only at large knuckles, press putty down against thumb. Keep fingertips straight. Repeat 10 times. Do 2 sessions per day.

## 2015-05-26 NOTE — Therapy (Signed)
[facility] [address] [place], [place], [address] Phone: [phone]   Fax:  [phone]  Occupational Therapy Treatment  Patient Details  Name: [name] MRN: [id number] Date of Birth: 10-22-1960 Referring Provider: Dr. August Saucer  Encounter Date: 05/26/2015      OT End of Session - 05/26/15 0858    Visit Number 6   Number of Visits 17   Date for OT Re-Evaluation 06/13/15   Authorization Type self pay   OT Start Time [id number]  pt arrived late due to traffic   OT Stop Time 0930   OT Time Calculation (min) 38 min   Activity Tolerance Patient tolerated treatment well   Behavior During Therapy [facility] for tasks assessed/performed      Past Medical History  Diagnosis Date  . Arthritis   . COPD (chronic obstructive pulmonary disease) (HCC)   . Anxiety   . Palpitations   . Constipation   . Staph infection   . Gallbladder sludge   . Hypertension   . GERD (gastroesophageal reflux disease)   . Headache     used to have migraines  . Family history of adverse reaction to anesthesia     brother was "allergic" to anesthesia - heart stopped beating on the surgery table    Past Surgical History  Procedure Laterality Date  . Groin debridement      due to spider bite,   . Tonsillectomy    . Tubal ligation    . Open reduction internal fixation (orif) proximal phalanx Right 04/07/2015    Procedure: OPEN REDUCTION INTERNAL FIXATION (ORIF) PROXIMAL PHALANX;  Surgeon: Cammy Copa, MD;  Location: [facility] OR;  Service: Orthopedics;  Laterality: Right;    There were no vitals filed for this visit.  Visit Diagnosis:  Pain of right hand  Stiffness of finger joint of right hand  Weakness of right hand      Subjective Assessment - 05/26/15 0856    Subjective  Pt reports that MD released her yesterday   Limitations MD now cleared for strengthening and d/c splint 05/25/15 (pt brought in updated script)   Patient Stated Goals  Pt wants to become a chef   Currently in Pain? Yes   Pain Score 4    Pain Location --  4TH DIGIT   Pain Orientation Right   Pain Descriptors / Indicators Aching   Pain Frequency Intermittent   Aggravating Factors  movement, at night/in morning with stiffness   Pain Relieving Factors heat, pain medication                       OT Treatments/Exercises (OP) - 05/26/15 0001    ADLs   ADL Comments checked ROM/goals and discussed progress--see goals section   Modalities   Modalities Paraffin   RUE Paraffin   Number Minutes Paraffin 10 Minutes   RUE Paraffin Location Hand  right   Comments with no adverse reactions     Treatment:  Tendon glides x10, isolated/blocked PIP extension x15, blocked/isolated MP flex/ext x15.  Instructed pt to continue ROM exercises PIP ext PROM at home particularly with stiffness.  Pt verbalized understanding.           OT Education - 05/26/15 0904    Education Details Red putty HEP   Person(s) Educated Patient   Methods Explanation;Demonstration;Verbal cues;Handout   Comprehension Verbalized understanding;Returned demonstration;Verbal cues required          OT Short Term Goals -  05/26/15 0901    OT SHORT TERM GOAL #1   Title I with splint wear, care and precautions following 1-2 weeks of wear to ensure proper fit.   Time 4   Period Weeks   Status Achieved  05/26/15:  MD d/c splint yesterday   OT SHORT TERM GOAL #2   Title I with inital HEP.   Time 4   Period Weeks   Status Achieved   OT SHORT TERM GOAL #3   Title Pt will increase MP flexion for ring and small finger by 15* for increased functional use.   Baseline ring: 60, small : 55   Time 4   Period Weeks   Status Achieved  05/26/15:  85* each, grossly WNL   OT SHORT TERM GOAL #4   Title Pt will increase PIP flexion of ring and small finger by 15 for increased functional use.   Time 4   Period Weeks   Status Achieved  05/26/15:  90*           OT Long Term  Goals - 05/26/15 0908    OT LONG TERM GOAL #1   Title I with updated HEP.   Time 8   Period Weeks   Status New   OT LONG TERM GOAL #2   Title Pt will resume use of RUE as dominant hand at least 90% of the time for ADLs/ IADLs with pain less than or equal to 3/10.   Time 8   Period Weeks   Status On-going   OT LONG TERM GOAL #3   Title Pt will demonstrate RUE grip strength of at least 30 lbs for increased functional use.   Baseline 05/26/15:  R-25lbs, L-69lbs   Time 8   Period Weeks   Status On-going   OT LONG TERM GOAL #4   Title Pt will demonstrate 80* or greater MP flexion for digits 4,5 for increased functional use.   Time 8   Period Weeks   Status Achieved  05/26/15:  85* digits 4,5               Plan - 05/26/15 0913    Clinical Impression Statement Pt is progressing towards goals with finger flex now grossly WNL.  MD sent note clearing pt for strengthening.  Initiated strengthening today and pt tolerated well with difficulty, but no increased pain.   Plan modalities, continue with strengthening   OT Home Exercise Plan Education issued:  red putty HEP 05/26/15   Consulted and Agree with Plan of Care Patient        Problem List Patient Active Problem List   Diagnosis Date Noted  . Proximal phalanx fracture of finger 03/31/2015    [facility] 05/26/2015, 9:29 AM  [facility] [address] [place], [place], [address] Phone: [phone]   Fax:  [phone]  Name: [name] MRN: [id number] Date of Birth: 1960/08/13  Willa Frater, OTR/L [facility] [address] [place], [place]  [address] [phone] phone [phone] 05/26/2015 9:29 AM

## 2015-05-29 ENCOUNTER — Ambulatory Visit: Payer: Self-pay | Attending: Orthopedic Surgery | Admitting: Occupational Therapy

## 2015-05-29 DIAGNOSIS — M79641 Pain in right hand: Secondary | ICD-10-CM | POA: Insufficient documentation

## 2015-05-29 DIAGNOSIS — R29898 Other symptoms and signs involving the musculoskeletal system: Secondary | ICD-10-CM

## 2015-05-29 DIAGNOSIS — M25641 Stiffness of right hand, not elsewhere classified: Secondary | ICD-10-CM | POA: Insufficient documentation

## 2015-05-29 DIAGNOSIS — M6289 Other specified disorders of muscle: Secondary | ICD-10-CM | POA: Insufficient documentation

## 2015-05-29 NOTE — Therapy (Signed)
[facility] [address] [place], [place], [address] Phone: [phone]   Fax:  [phone]  Occupational Therapy Treatment  Patient Details  Name: [name] MRN: [id number] Date of Birth: 02/09/1961 Referring Provider: Dr. August Saucer  Encounter Date: 05/29/2015      OT End of Session - 05/29/15 1505    Visit Number 7   Number of Visits 17   Date for OT Re-Evaluation 06/13/15   Authorization Type self pay   OT Start Time 1500   OT Stop Time 1540   OT Time Calculation (min) 40 min   Activity Tolerance Patient tolerated treatment well   Behavior During Therapy [facility] for tasks assessed/performed      Past Medical History  Diagnosis Date  . Arthritis   . COPD (chronic obstructive pulmonary disease) (HCC)   . Anxiety   . Palpitations   . Constipation   . Staph infection   . Gallbladder sludge   . Hypertension   . GERD (gastroesophageal reflux disease)   . Headache     used to have migraines  . Family history of adverse reaction to anesthesia     brother was "allergic" to anesthesia - heart stopped beating on the surgery table    Past Surgical History  Procedure Laterality Date  . Groin debridement      due to spider bite,   . Tonsillectomy    . Tubal ligation    . Open reduction internal fixation (orif) proximal phalanx Right 04/07/2015    Procedure: OPEN REDUCTION INTERNAL FIXATION (ORIF) PROXIMAL PHALANX;  Surgeon: Cammy Copa, MD;  Location: [facility] OR;  Service: Orthopedics;  Laterality: Right;    There were no vitals filed for this visit.  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 No/denies        Putty exercises for composite grip and then tip pinch, finger extension 10-20 reps each Gripper set at 20 lbs to pick up 1/2 the 1 inch blocks then 25 lbs for remaining blocks several drops and increased resistance once resistance was increased. Red digiflex x 20 reps compositely,  10 reps individual finger flexion Graded clothespins for increased sustained pinch, ring/ small and ring / middle fingers In hand manipulation and translation with pennies then placing/ removing grooved pegs from pegboard min difficulty.               OT Treatments/Exercises (OP) - 05/29/15 0001    Modalities   Modalities Paraffin prior to exercise   RUE Paraffin   Number Minutes Paraffin 9 Minutes   Comments no adverse reactions                  OT Short Term Goals - 05/26/15 0901    OT SHORT TERM GOAL #1   Title I with splint wear, care and precautions following 1-2 weeks of wear to ensure proper fit.   Time 4   Period Weeks   Status Achieved  05/26/15:  MD d/c splint yesterday   OT SHORT TERM GOAL #2   Title I with inital HEP.   Time 4   Period Weeks  Status Achieved   OT SHORT TERM GOAL #3   Title Pt will increase MP flexion for ring and small finger by 15* for increased functional use.   Baseline ring: 60, small : 55   Time 4   Period Weeks   Status Achieved  05/26/15:  85* each, grossly WNL   OT SHORT TERM GOAL #4   Title Pt will increase PIP flexion of ring and small finger by 15 for increased functional use.   Time 4   Period Weeks   Status Achieved  05/26/15:  90*           OT Long Term Goals - 05/26/15 0908    OT LONG TERM GOAL #1   Title I with updated HEP.   Time 8   Period Weeks   Status New   OT LONG TERM GOAL #2   Title Pt will resume use of RUE as dominant hand at least 90% of the time for ADLs/ IADLs with pain less than or equal to 3/10.   Time 8   Period Weeks   Status On-going   OT LONG TERM GOAL #3   Title Pt will demonstrate RUE  grip strength of at least 30 lbs for increased functional use.   Baseline 05/26/15:  R-25lbs, L-69lbs   Time 8   Period Weeks   Status On-going   OT LONG TERM GOAL #4   Title Pt will demonstrate 80* or greater MP flexion for digits 4,5 for increased functional use.   Time 8   Period Weeks   Status Achieved  05/26/15:  85* digits 4,5               Plan - 05/29/15 1503    Clinical Impression Statement Pt is progressing towards goals for strengthening.   Pt will benefit from skilled therapeutic intervention in order to improve on the following deficits (Retired) Decreased coordination;Decreased range of motion;Impaired flexibility;Impaired sensation;Increased edema;Decreased safety awareness;Decreased endurance;Decreased activity tolerance;Decreased knowledge of precautions;Pain;Impaired UE functional use;Decreased balance;Decreased cognition;Decreased strength   Rehab Potential Good   OT Frequency 2x / week   OT Duration 8 weeks   OT Treatment/Interventions Self-care/ADL training;Therapeutic exercise;Patient/family education;Splinting;Ultrasound;Neuromuscular education;Manual Therapy;Therapeutic exercises;Parrafin;DME and/or AE instruction;Therapeutic activities;Scar mobilization;Fluidtherapy;Electrical Stimulation;Moist Heat;Contrast Bath;Passive range of motion   Plan modalities, continue with strengthening    OT Home Exercise Plan Education issued:  red putty HEP 05/26/15   Consulted and Agree with Plan of Care Patient;Family member/caregiver        Problem List Patient Active Problem List   Diagnosis Date Noted  . Proximal phalanx fracture of finger 03/31/2015    RINE,KATHRYN 05/29/2015, 3:27 PM  [facility] [address] [place], [place], [address] Phone: [phone]   Fax:  [phone]  Name: Chanetta MarshallKaren Losito MRN: [id number] Date of Birth: [birth year]

## 2015-06-02 ENCOUNTER — Encounter: Payer: Self-pay | Admitting: Occupational Therapy

## 2015-06-04 ENCOUNTER — Encounter: Payer: Self-pay | Admitting: Occupational Therapy

## 2015-06-05 ENCOUNTER — Encounter: Payer: Self-pay | Admitting: Occupational Therapy

## 2015-06-09 ENCOUNTER — Ambulatory Visit: Payer: Self-pay | Admitting: Occupational Therapy

## 2015-06-12 ENCOUNTER — Encounter: Payer: Self-pay | Admitting: Occupational Therapy

## 2015-06-16 ENCOUNTER — Encounter: Payer: Self-pay | Admitting: Occupational Therapy

## 2015-06-19 ENCOUNTER — Encounter: Payer: Self-pay | Admitting: Occupational Therapy

## 2016-09-13 ENCOUNTER — Emergency Department (HOSPITAL_COMMUNITY)
Admission: EM | Admit: 2016-09-13 | Discharge: 2016-09-13 | Disposition: A | Discharge status: Home / Self Care | Payer: Self-pay | Attending: Emergency Medicine | Admitting: Emergency Medicine

## 2016-09-13 ENCOUNTER — Emergency Department (HOSPITAL_COMMUNITY): Payer: Self-pay

## 2016-09-13 ENCOUNTER — Encounter (HOSPITAL_COMMUNITY): Payer: Self-pay

## 2016-09-13 DIAGNOSIS — J069 Acute upper respiratory infection, unspecified: Secondary | ICD-10-CM | POA: Insufficient documentation

## 2016-09-13 DIAGNOSIS — F17210 Nicotine dependence, cigarettes, uncomplicated: Secondary | ICD-10-CM | POA: Insufficient documentation

## 2016-09-13 DIAGNOSIS — Z79899 Other long term (current) drug therapy: Secondary | ICD-10-CM | POA: Insufficient documentation

## 2016-09-13 DIAGNOSIS — I10 Essential (primary) hypertension: Secondary | ICD-10-CM | POA: Insufficient documentation

## 2016-09-13 DIAGNOSIS — J441 Chronic obstructive pulmonary disease with (acute) exacerbation: Secondary | ICD-10-CM | POA: Insufficient documentation

## 2016-09-13 DIAGNOSIS — Z7951 Long term (current) use of inhaled steroids: Secondary | ICD-10-CM | POA: Insufficient documentation

## 2016-09-13 LAB — CBC WITH DIFFERENTIAL/PLATELET
BASOS ABS: 0 10*3/uL (ref 0.0–0.1)
Basophils Relative: 0 %
EOS PCT: 1 %
Eosinophils Absolute: 0.1 10*3/uL (ref 0.0–0.7)
HCT: 45.7 % (ref 36.0–46.0)
Hemoglobin: 15.6 g/dL — ABNORMAL HIGH (ref 12.0–15.0)
LYMPHS PCT: 36 %
Lymphs Abs: 3.4 10*3/uL (ref 0.7–4.0)
MCH: 31.5 pg (ref 26.0–34.0)
MCHC: 34.1 g/dL (ref 30.0–36.0)
MCV: 92.1 fL (ref 78.0–100.0)
Monocytes Absolute: 0.8 10*3/uL (ref 0.1–1.0)
Monocytes Relative: 9 %
Neutro Abs: 5 10*3/uL (ref 1.7–7.7)
Neutrophils Relative %: 54 %
PLATELETS: 251 10*3/uL (ref 150–400)
RBC: 4.96 MIL/uL (ref 3.87–5.11)
RDW: 13.4 % (ref 11.5–15.5)
WBC: 9.3 10*3/uL (ref 4.0–10.5)

## 2016-09-13 LAB — TROPONIN I

## 2016-09-13 LAB — COMPREHENSIVE METABOLIC PANEL
ALT: 29 U/L (ref 14–54)
AST: 36 U/L (ref 15–41)
Albumin: 4 g/dL (ref 3.5–5.0)
Alkaline Phosphatase: 76 U/L (ref 38–126)
Anion gap: 8 (ref 5–15)
BUN: 12 mg/dL (ref 6–20)
CHLORIDE: 102 mmol/L (ref 101–111)
CO2: 26 mmol/L (ref 22–32)
Calcium: 9.3 mg/dL (ref 8.9–10.3)
Creatinine, Ser: 0.62 mg/dL (ref 0.44–1.00)
Glucose, Bld: 107 mg/dL — ABNORMAL HIGH (ref 65–99)
POTASSIUM: 3.6 mmol/L (ref 3.5–5.1)
SODIUM: 136 mmol/L (ref 135–145)
Total Bilirubin: 0.9 mg/dL (ref 0.3–1.2)
Total Protein: 7.7 g/dL (ref 6.5–8.1)

## 2016-09-13 LAB — D-DIMER, QUANTITATIVE: D-Dimer, Quant: <0.27 ug/mL-FEU (ref 0.00–0.50)

## 2016-09-13 LAB — BRAIN NATRIURETIC PEPTIDE: B NATRIURETIC PEPTIDE 5: 10 pg/mL (ref 0.0–100.0)

## 2016-09-13 MED ORDER — METHOCARBAMOL 500 MG PO TABS: 500.0000 mg | ORAL_TABLET | Freq: Three times a day (TID) | ORAL | 0 refills | Status: DC | PRN

## 2016-09-13 MED ORDER — LISINOPRIL 20 MG PO TABS: 20.0000 mg | ORAL_TABLET | Freq: Every day | ORAL | 0 refills | Status: DC

## 2016-09-13 MED ORDER — ALBUTEROL SULFATE (2.5 MG/3ML) 0.083% IN NEBU: 5.0000 mg | INHALATION_SOLUTION | Freq: Once | RESPIRATORY_TRACT | Status: DC

## 2016-09-13 MED ORDER — METHYLPREDNISOLONE SODIUM SUCC 125 MG IJ SOLR: 125.0000 mg | Freq: Once | INTRAMUSCULAR | Status: DC

## 2016-09-13 MED ORDER — KETOROLAC TROMETHAMINE 30 MG/ML IJ SOLN
30.0000 mg | Freq: Once | INTRAMUSCULAR | Status: AC
  Administered 2016-09-13: 30 mg via INTRAVENOUS
  Filled 2016-09-13: qty 1

## 2016-09-13 MED ORDER — PREDNISONE 20 MG PO TABS: ORAL_TABLET | ORAL | 0 refills | Status: DC

## 2016-09-13 NOTE — ED Notes (Signed)
EKG given to Dr Pickering 

## 2016-09-13 NOTE — ED Provider Notes (Signed)
[facility]-EMERGENCY DEPT Provider Note   CSN: [id number] Arrival date & time: 09/13/16  1135     History   Chief Complaint Chief Complaint  Patient presents with  . Shortness of Breath    HPI [name] is a 56 y.o. female.  HPI Patient presents with 3 days of cough productive of yellow sputum, subjective fevers and chills and shortness of breath. States she's had wheezing and has been using her albuterol nebulizer. She also complains of right-sided thoracic back pain. She states it's been present for more than the past few days. She states the pain is worse with movement and deep breathing. She also endorses some central chest "congestion" which she describes as tightness. Denies any new lower extremity swelling or pain. No recent hospitalizations, immobilizations or extended travel. States she's had a family member with recent URI. She's been out of her blood pressure medications for the past 6 months. Past Medical History:  Diagnosis Date  . Anxiety   . Arthritis   . Constipation   . COPD (chronic obstructive pulmonary disease) (HCC)   . Family history of adverse reaction to anesthesia    brother was "allergic" to anesthesia - heart stopped beating on the surgery table  . Gallbladder sludge   . GERD (gastroesophageal reflux disease)   . Headache    used to have migraines  . Hypertension   . Palpitations   . Staph infection     Patient Active Problem List   Diagnosis Date Noted  . Proximal phalanx fracture of finger 03/31/2015    Past Surgical History:  Procedure Laterality Date  . GROIN DEBRIDEMENT     due to spider bite,   . OPEN REDUCTION INTERNAL FIXATION (ORIF) PROXIMAL PHALANX Right 04/07/2015   Procedure: OPEN REDUCTION INTERNAL FIXATION (ORIF) PROXIMAL PHALANX;  Surgeon: Cammy Copa, MD;  Location: [facility] OR;  Service: Orthopedics;  Laterality: Right;  . TONSILLECTOMY    . TUBAL LIGATION      OB History    Gravida Para Term Preterm AB Living    3   SAB TAB Ectopic Multiple Live Births                   Home Medications    Prior to Admission medications   Medication Sig Start Date End Date Taking? Authorizing Provider  albuterol (PROVENTIL HFA;VENTOLIN HFA) 108 (90 Base) MCG/ACT inhaler Inhale 1-2 puffs into the lungs every 6 (six) hours as needed for wheezing or shortness of breath.   Yes [provider]  ranitidine (ZANTAC) 150 MG tablet Take 150 mg by mouth daily.   Yes [provider]  vitamin B-12 (CYANOCOBALAMIN) 1000 MCG tablet Take 1,000 mcg by mouth daily.   Yes [provider]  amLODipine (NORVASC) 5 MG tablet Take 5 mg by mouth 2 (two) times daily.     [provider]  hydrochlorothiazide (HYDRODIURIL) 25 MG tablet Take 25 mg by mouth daily.    [provider]  lisinopril (PRINIVIL,ZESTRIL) 20 MG tablet Take 1 tablet (20 mg total) by mouth daily. 09/13/16   [name], MD  methocarbamol (ROBAXIN) 500 MG tablet Take 1 tablet (500 mg total) by mouth every 8 (eight) hours as needed for muscle spasms. 09/13/16   [name], MD  naproxen (NAPROSYN) 500 MG tablet Take 1 tablet (500 mg total) by mouth 2 (two) times daily. Patient not taking: Reported on 09/13/2016 10/23/14   Santiago Glad, PA-C  oxyCODONE-acetaminophen (PERCOCET) 10-325 MG tablet Take 1-2 tablets  by mouth every 6 (six) hours as needed for pain. Patient not taking: Reported on 09/13/2016 04/07/15   Cammy Copaean, Scott Gregory, MD  predniSONE (DELTASONE) 20 MG tablet 3 tabs po day one, then 2 po daily x 4 days 09/13/16   Loren RacerYelverton, Akesha Uresti, MD    Family History Family History  Problem Relation Age of Onset  . COPD Mother   . Diabetes Father     Social History Social History  Substance Use Topics  . Smoking status: Current Some Day Smoker    Packs/day: 0.25    Types: Cigarettes  . Smokeless tobacco: Never Used  . Alcohol use Yes     Comment: wine occ     Allergies   Penicillins   Review of  Systems Review of Systems  Constitutional: Positive for chills, fatigue and fever.  HENT: Negative for rhinorrhea, sinus pain, sinus pressure and sore throat.   Respiratory: Positive for cough, chest tightness, shortness of breath and wheezing.   Cardiovascular: Negative for chest pain, palpitations and leg swelling.  Gastrointestinal: Negative for abdominal pain, constipation, diarrhea, nausea and vomiting.  Musculoskeletal: Positive for back pain and myalgias. Negative for neck pain and neck stiffness.  Skin: Negative for rash and wound.  Neurological: Negative for dizziness, weakness, light-headedness, numbness and headaches.  All other systems reviewed and are negative.    Physical Exam Updated Vital Signs BP (!) 112/98   Pulse 76   Temp 97.9 F (36.6 C) (Oral)   Resp 18   Ht 5' (1.524 m)   Wt 64.4 kg (142 lb)   SpO2 95%   BMI 27.73 kg/m   Physical Exam  Constitutional: She is oriented to person, place, and time. She appears well-developed and well-nourished. No distress.  HENT:  Head: Normocephalic and atraumatic.  Mouth/Throat: Oropharynx is clear and moist. No oropharyngeal exudate.  Eyes: EOM are normal. Pupils are equal, round, and reactive to light.  Neck: Normal range of motion. Neck supple.  Cardiovascular: Normal rate and regular rhythm.  Exam reveals no gallop and no friction rub.   No murmur heard. Pulmonary/Chest: Effort normal and breath sounds normal. No respiratory distress. She has no wheezes. She has no rales. She exhibits no tenderness.  Mildly diminished bilateral bases  Abdominal: Soft. Bowel sounds are normal. There is no tenderness. There is no rebound and no guarding.  Musculoskeletal: Normal range of motion. She exhibits no edema or tenderness.  No lower extremity swelling, asymmetry or tenderness. Distal pulses are 2+.  Lymphadenopathy:    She has no cervical adenopathy.  Neurological: She is alert and oriented to person, place, and time.   Skin: Skin is warm and dry. No rash noted. No erythema.  Psychiatric: She has a normal mood and affect. Her behavior is normal.  Nursing note and vitals reviewed.    ED Treatments / Results  Labs (all labs ordered are listed, but only abnormal results are displayed) Labs Reviewed  CBC WITH DIFFERENTIAL/PLATELET - Abnormal; Notable for the following:       Result Value   Hemoglobin 15.6 (*)    All other components within normal limits  COMPREHENSIVE METABOLIC PANEL - Abnormal; Notable for the following:    Glucose, Bld 107 (*)    All other components within normal limits  BRAIN NATRIURETIC PEPTIDE  TROPONIN I  D-DIMER, QUANTITATIVE (NOT AT [facility])    EKG  EKG Interpretation None       Radiology Dg Chest 2 View  Result Date: 09/13/2016 CLINICAL DATA:  Right-sided chest pain and pressure for 3 or 4 days. Smoker with history of asthma/ COPD. EXAM: CHEST  2 VIEW COMPARISON:  Radiographs 12/19/2011 and 05/17/2005. FINDINGS: The anterior chest wall is partially excluded on the lateral view. The heart size and mediastinal contours are stable. There is aortic atherosclerosis. The lungs are clear. There is no pleural effusion or pneumothorax. No acute osseous findings are seen. Mild thoracic spine degenerative changes are present. IMPRESSION: Stable chest.  No acute cardiopulmonary process. Electronically Signed   By: Carey Bullocks M.D.   On: 09/13/2016 12:41    Procedures Procedures (including critical care time)  Medications Ordered in ED Medications  ketorolac (TORADOL) 30 MG/ML injection 30 mg (30 mg Intravenous Given 09/13/16 1303)     Initial Impression / Assessment and Plan / ED Course  I have reviewed the triage vital signs and the nursing notes.  Pertinent labs & imaging results that were available during my care of the patient were reviewed by me and considered in my medical decision making (see chart for details).    Patient states she is feeling better after  medication. X-ray without any acute abnormality. Normal d-dimer and troponin. No suspicion for coronary artery disease. Likely URI and COPD exacerbation. We have short course of steroids and refill patient's lisinopril. She is encouraged to establish care with a primary physician to monitor her blood pressure. Return precautions given.   Final Clinical Impressions(s) / ED Diagnoses   Final diagnoses:  Upper respiratory tract infection, unspecified type  COPD exacerbation (HCC)    New Prescriptions New Prescriptions   METHOCARBAMOL (ROBAXIN) 500 MG TABLET    Take 1 tablet (500 mg total) by mouth every 8 (eight) hours as needed for muscle spasms.   PREDNISONE (DELTASONE) 20 MG TABLET    3 tabs po day one, then 2 po daily x 4 days     [name], MD 09/13/16 1422

## 2016-09-13 NOTE — ED Triage Notes (Signed)
Reports of productive cough, shortness of breath and chest pressure x3 days. States pain starts in left side of chest and radiates to right upper back. Also has right side neck and shoulder pain. NAD noted.

## 2016-09-14 IMAGING — CT CT HAND*R* W/O CM
2 of 10 series · 14 of 33 positions shown, 17 images · non-contrast
Comparison: None.

CLINICAL DATA: Right hand pain.  Preop surgery 04/07/2015

EXAM:
CT OF THE RIGHT HAND WITHOUT CONTRAST
TECHNIQUE: Multidetector CT imaging of the right hand was performed according
to the standard protocol. Multiplanar CT image reconstructions were
also generated.

[Series 6: rt hand st · axial · 0.29mm/px · z∈[+809,+959]mm · 11 of 37 slices shown, 14 images]
[im 4/37  soft-tissue]
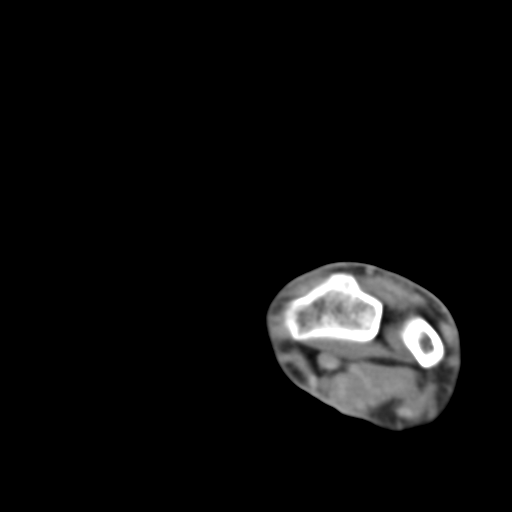
[im 4/37  bone]
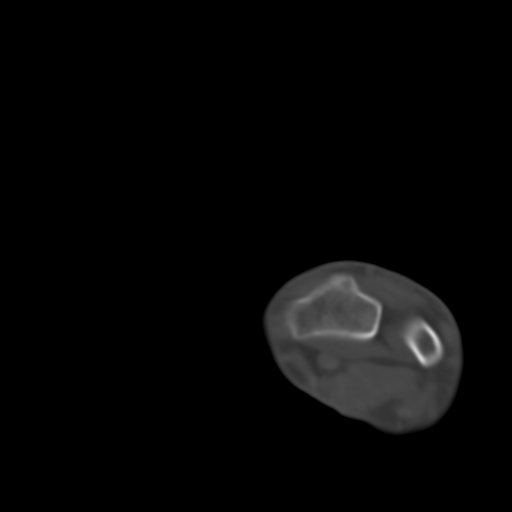
[im 7/37  bone]
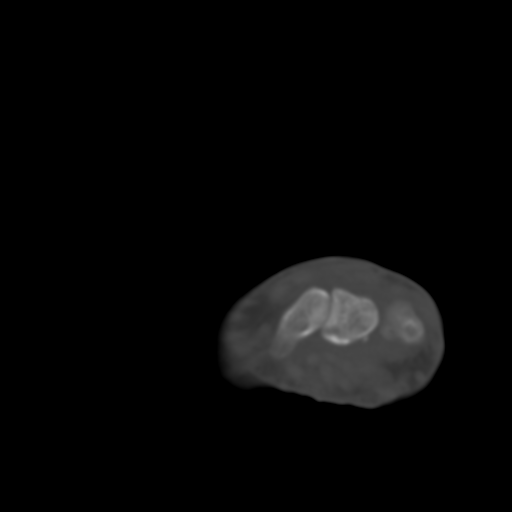
[im 10/37  bone]
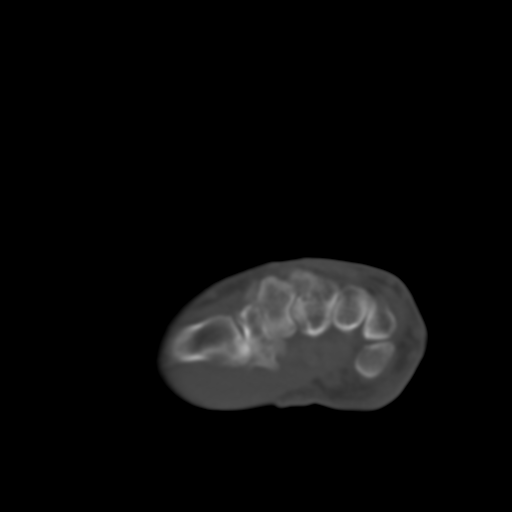
[im 13/37  bone]
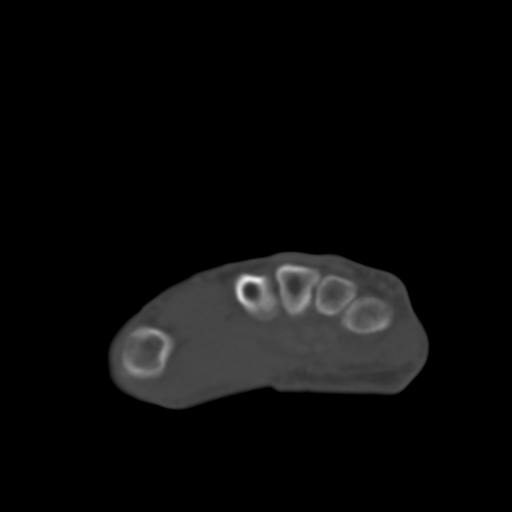
[im 16/37  soft-tissue]
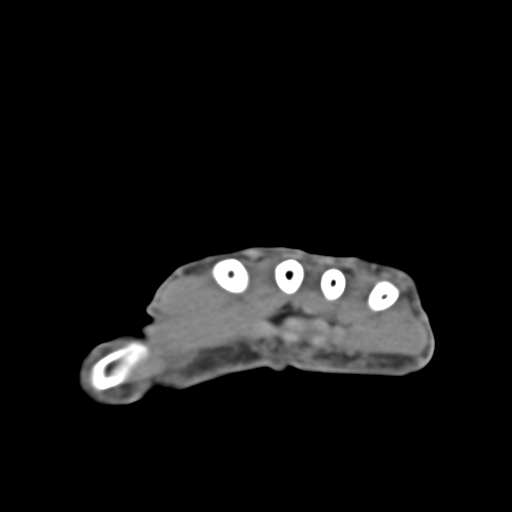
[im 16/37  bone]
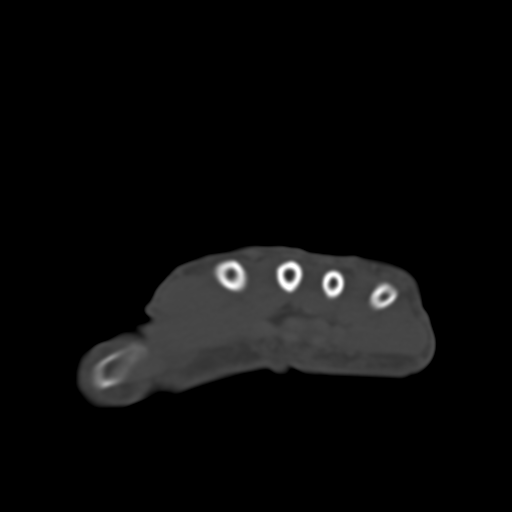
[im 19/37  bone]
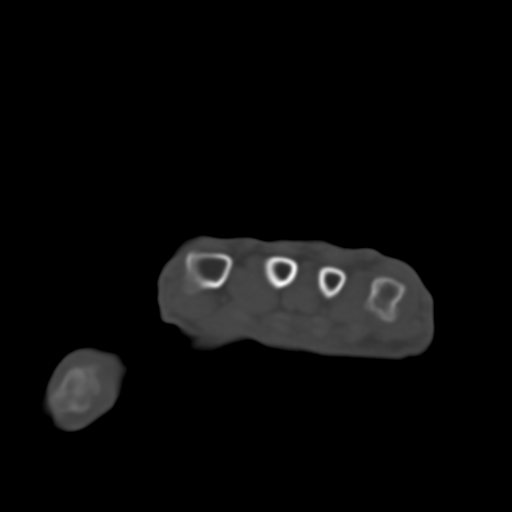
[im 22/37  bone]
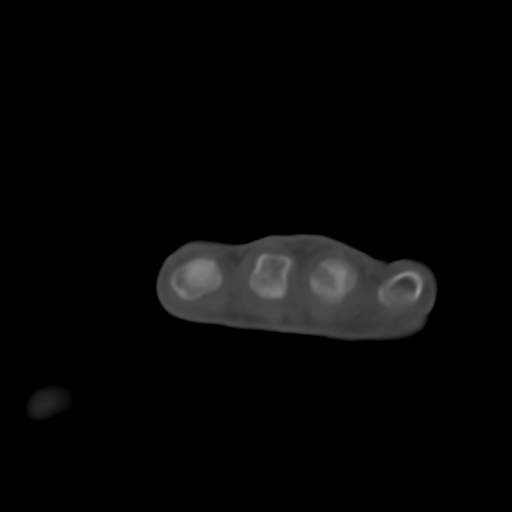
[im 25/37  bone]
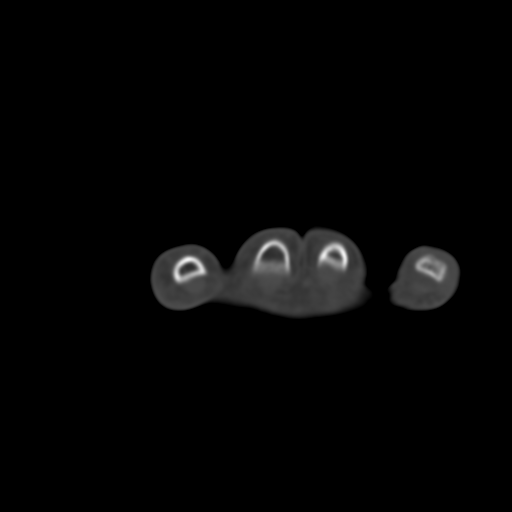
[im 28/37  soft-tissue]
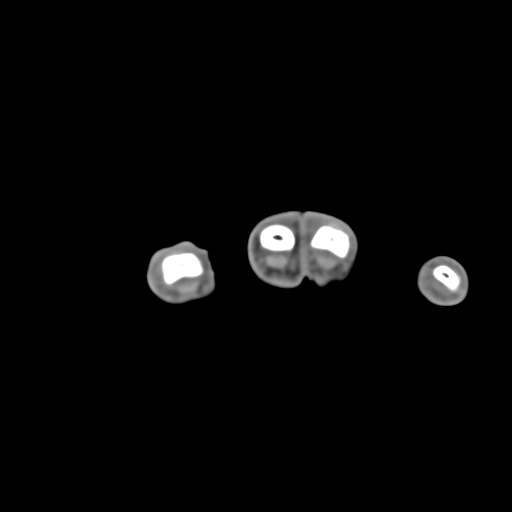
[im 28/37  bone]
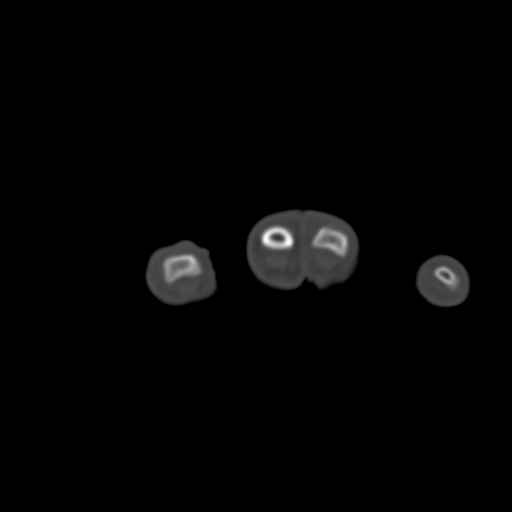
[im 31/37  bone]
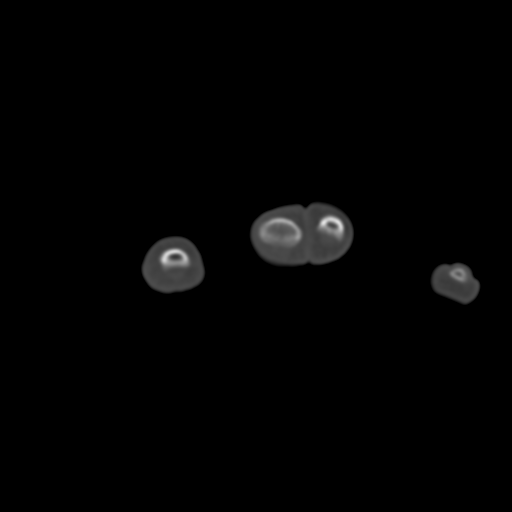
[im 34/37  bone]
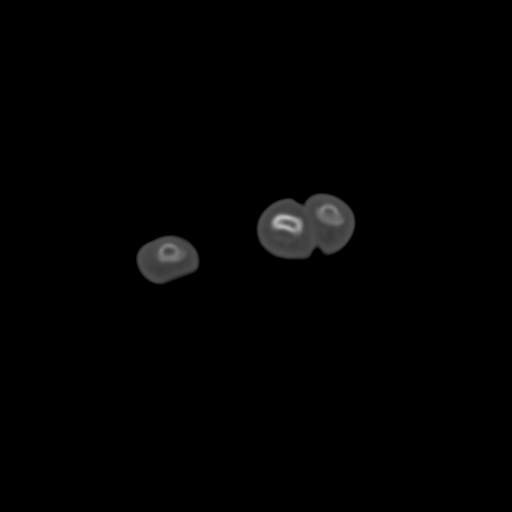

[Series 604: sag rt hand · sagittal · 0.43mm/px · 3 of 71 slices shown]
[im 18/71  bone]
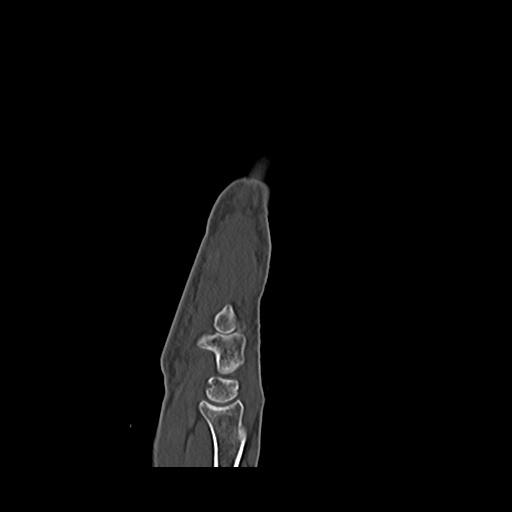
[im 36/71  bone]
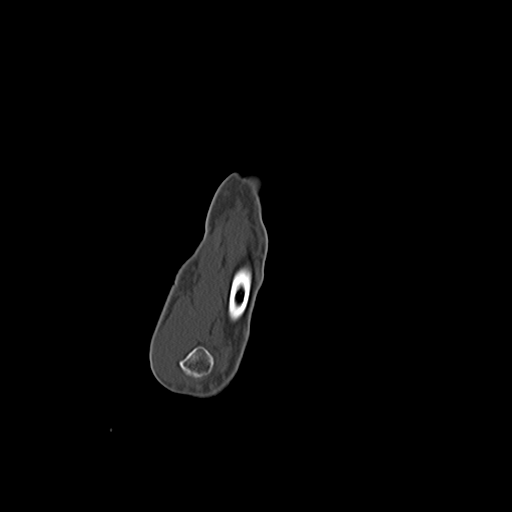
[im 53/71  bone]
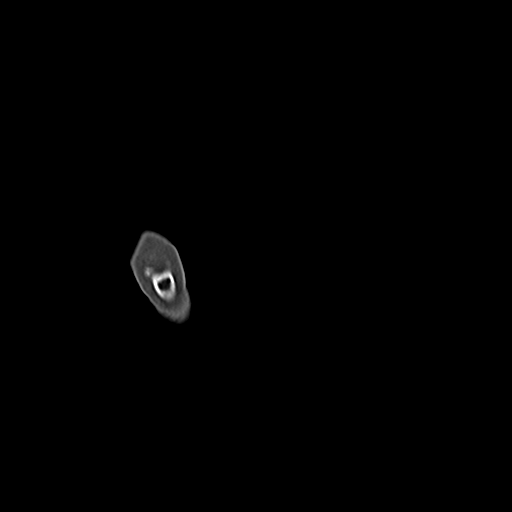

[14 of 33 positions shown; findings below may reference images not displayed]

FINDINGS: Mildly comminuted fracture of the base of the fourth proximal
phalanx involving the articular surface with 3 mm of focal
depression of the articular surface. Involving an area of 3 x 5 mm
of the articular surface. No dislocation.

Healed right fifth metacarpal neck fracture with mild apex dorsal
angulation.

No acute fracture or dislocation. No aggressive lytic or sclerotic
osseous lesion.

Mild osteoarthritis of the first CMC joint partially visualized.

No fluid collection or hematoma.
IMPRESSION: 1. Acute, mildly comminuted fracture of the base of the right fourth
proximal phalanx as described above.

## 2016-09-15 IMAGING — CR DG HAND 2V*R*
2 series · 2 of 2 positions shown · non-contrast
Comparison: CT of the and dated 04/06/2015

CLINICAL DATA: Open-reduction internal-fixation of the proximal
right phalanx fracture.

EXAM:
RIGHT HAND - 2 VIEW

[PA]
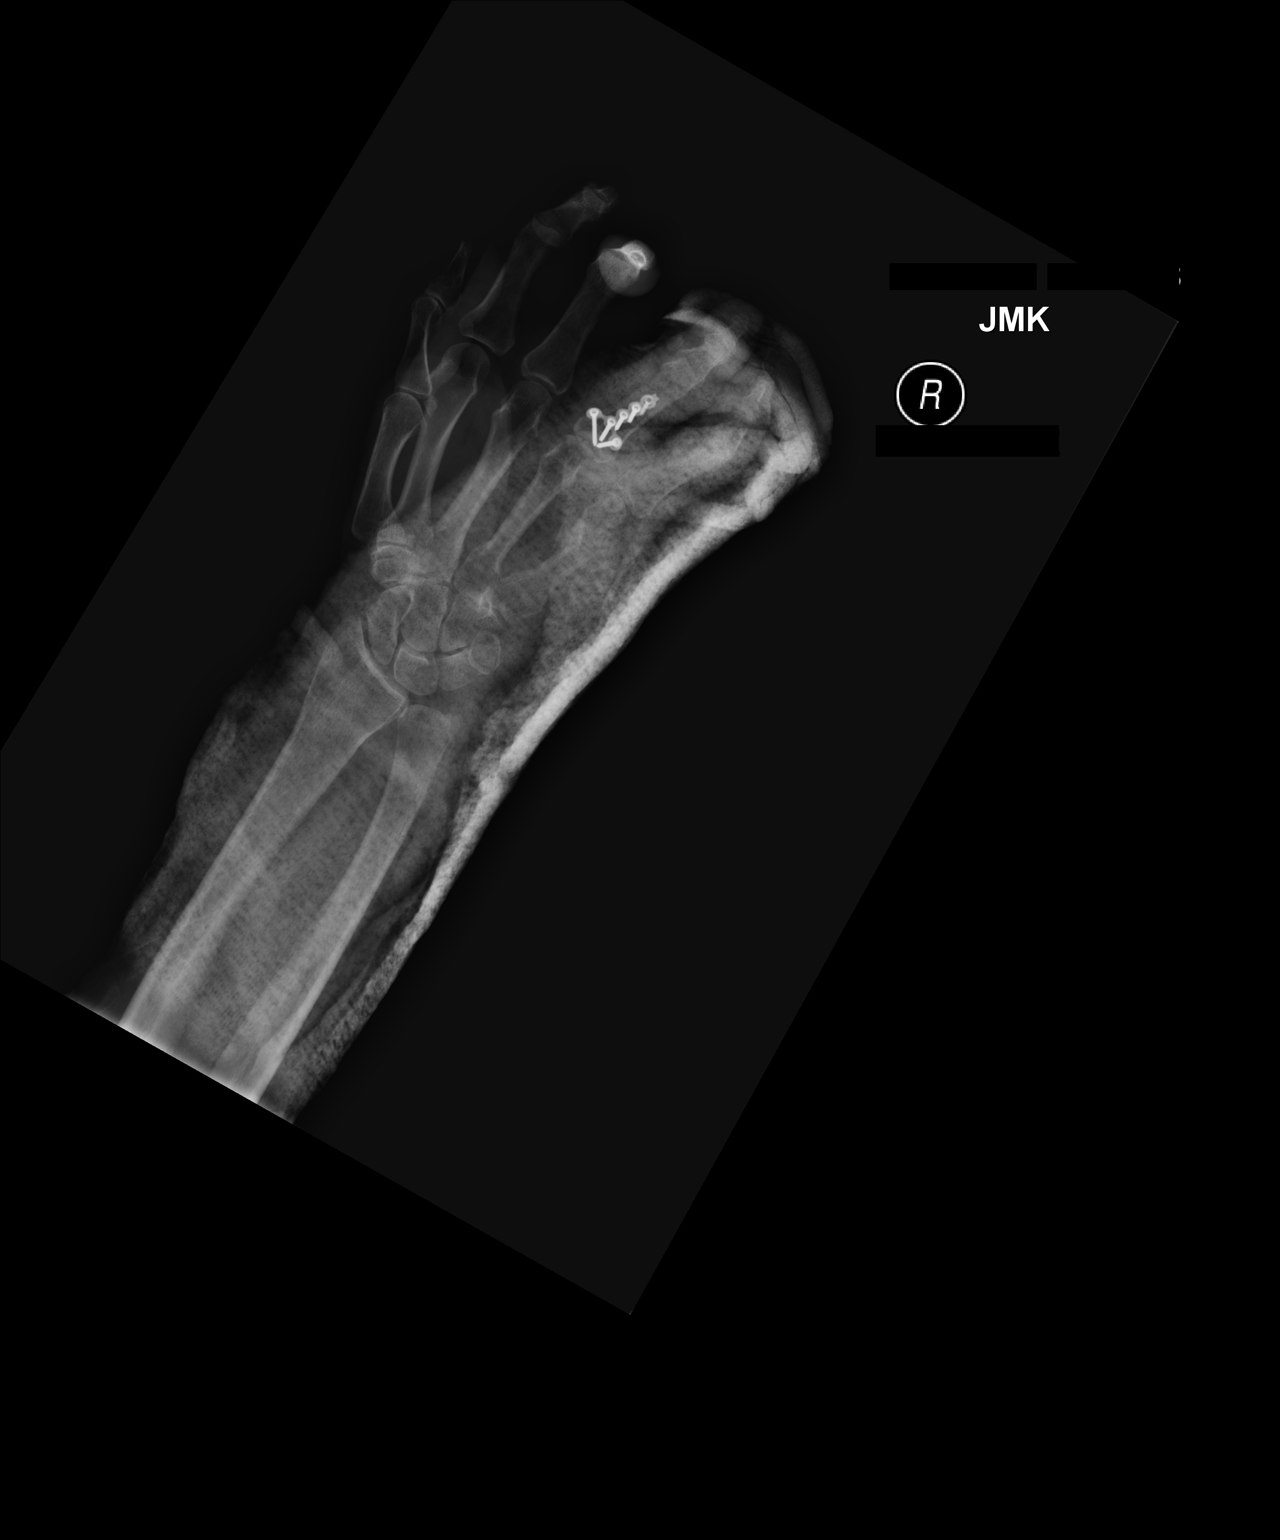

[lateral]
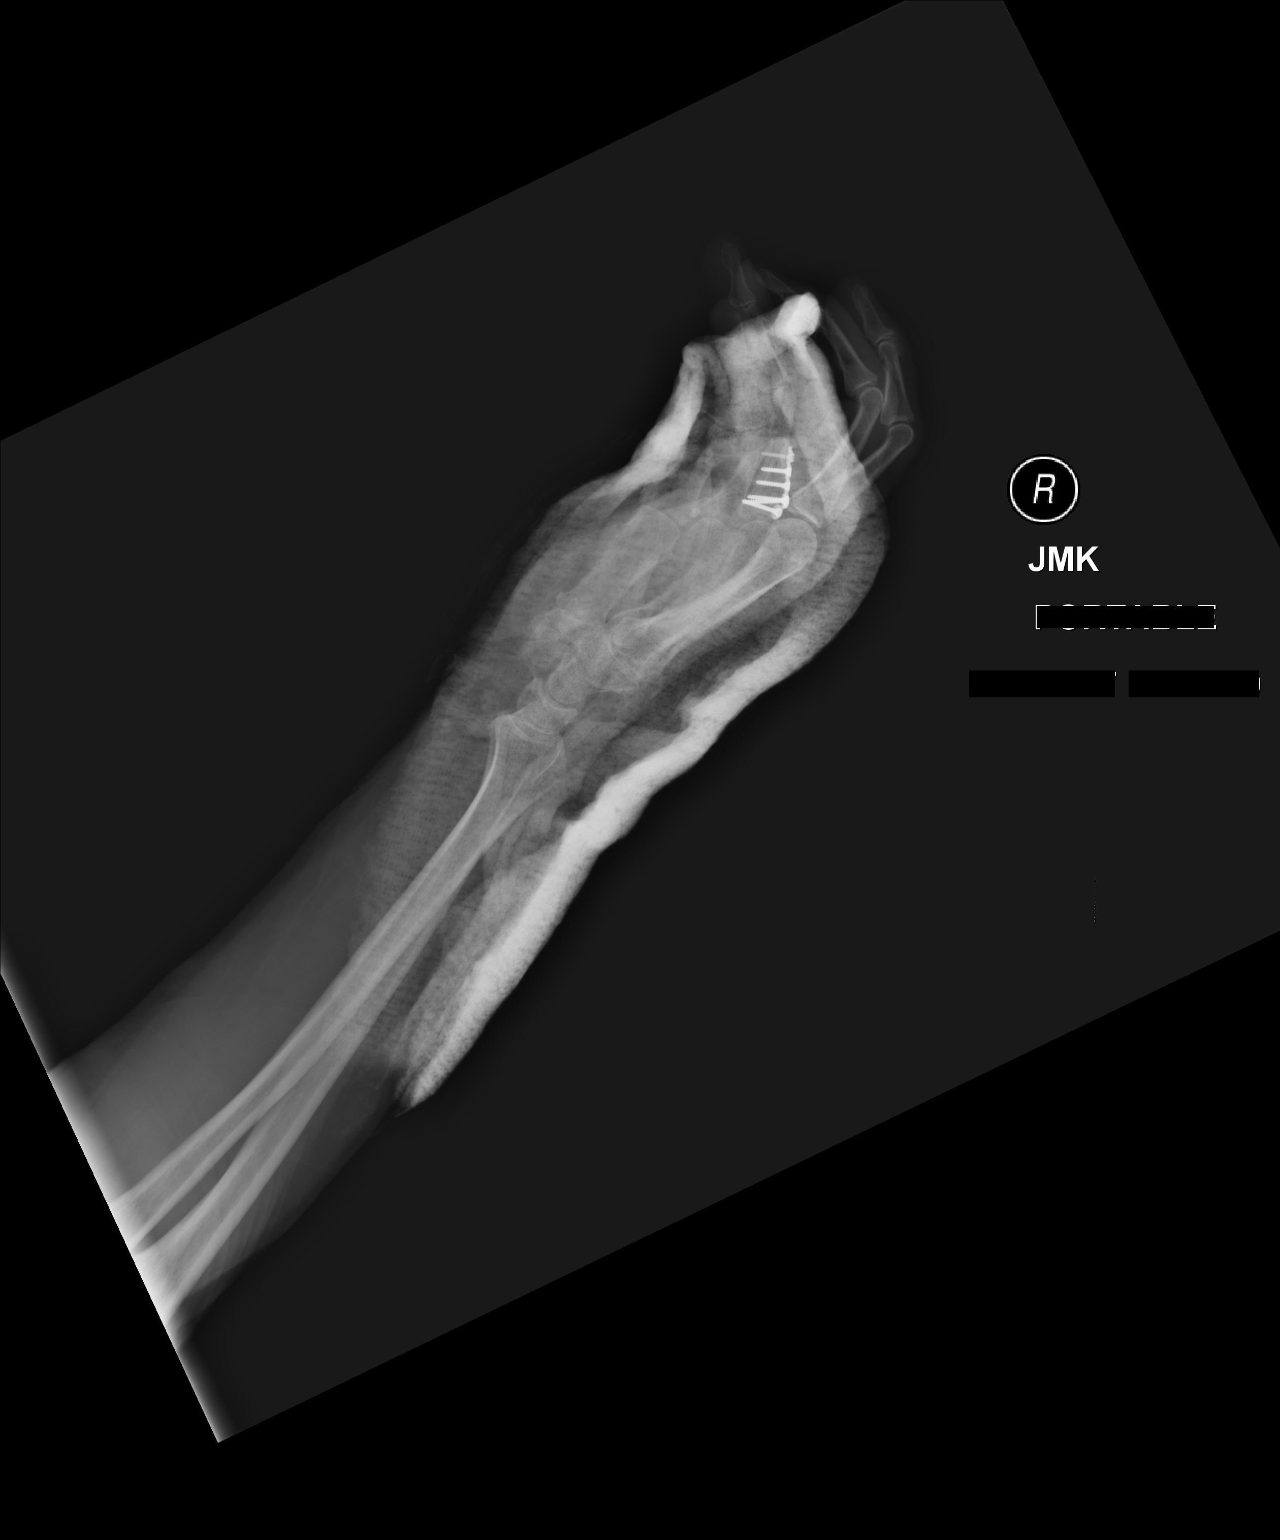

[2 of 2 positions shown; findings below may reference images not displayed]

FINDINGS: There has been interval reduction and screw fixation of comminuted
fracture of the base of the right fourth proximal phalanx. The
alignment is anatomic. Casting material overlies the soft tissues of
obscures bony details.
IMPRESSION: Status post reduction and screw fixation of proximal right phalanx
fracture, with satisfactory alignment and no evidence of immediate
complications.

## 2017-08-21 ENCOUNTER — Emergency Department (HOSPITAL_COMMUNITY): Payer: Self-pay

## 2017-08-21 ENCOUNTER — Other Ambulatory Visit: Payer: Self-pay

## 2017-08-21 ENCOUNTER — Encounter (HOSPITAL_COMMUNITY): Payer: Self-pay | Admitting: Emergency Medicine

## 2017-08-21 ENCOUNTER — Emergency Department (HOSPITAL_COMMUNITY)
Admission: EM | Admit: 2017-08-21 | Discharge: 2017-08-21 | Disposition: A | Discharge status: Home / Self Care | Payer: Self-pay | Attending: Emergency Medicine | Admitting: Emergency Medicine

## 2017-08-21 DIAGNOSIS — Z79899 Other long term (current) drug therapy: Secondary | ICD-10-CM | POA: Insufficient documentation

## 2017-08-21 DIAGNOSIS — R059 Cough, unspecified: Secondary | ICD-10-CM

## 2017-08-21 DIAGNOSIS — R05 Cough: Secondary | ICD-10-CM | POA: Insufficient documentation

## 2017-08-21 DIAGNOSIS — J449 Chronic obstructive pulmonary disease, unspecified: Secondary | ICD-10-CM | POA: Insufficient documentation

## 2017-08-21 DIAGNOSIS — I10 Essential (primary) hypertension: Secondary | ICD-10-CM | POA: Insufficient documentation

## 2017-08-21 DIAGNOSIS — F17210 Nicotine dependence, cigarettes, uncomplicated: Secondary | ICD-10-CM | POA: Insufficient documentation

## 2017-08-21 DIAGNOSIS — Z760 Encounter for issue of repeat prescription: Secondary | ICD-10-CM | POA: Insufficient documentation

## 2017-08-21 DIAGNOSIS — R0981 Nasal congestion: Secondary | ICD-10-CM | POA: Insufficient documentation

## 2017-08-21 DIAGNOSIS — J029 Acute pharyngitis, unspecified: Secondary | ICD-10-CM | POA: Insufficient documentation

## 2017-08-21 DIAGNOSIS — H66002 Acute suppurative otitis media without spontaneous rupture of ear drum, left ear: Secondary | ICD-10-CM | POA: Insufficient documentation

## 2017-08-21 LAB — I-STAT CHEM 8, ED
BUN: 13 mg/dL (ref 6–20)
CALCIUM ION: 1.16 mmol/L (ref 1.15–1.40)
CHLORIDE: 103 mmol/L (ref 101–111)
Creatinine, Ser: 0.5 mg/dL (ref 0.44–1.00)
GLUCOSE: 101 mg/dL — AB (ref 65–99)
HCT: 48 % — ABNORMAL HIGH (ref 36.0–46.0)
Hemoglobin: 16.3 g/dL — ABNORMAL HIGH (ref 12.0–15.0)
Potassium: 4.1 mmol/L (ref 3.5–5.1)
Sodium: 138 mmol/L (ref 135–145)
TCO2: 24 mmol/L (ref 22–32)

## 2017-08-21 MED ORDER — AZITHROMYCIN 250 MG PO TABS
500.0000 mg | ORAL_TABLET | Freq: Once | ORAL | Status: AC
  Administered 2017-08-21: 500 mg via ORAL
  Filled 2017-08-21: qty 2

## 2017-08-21 MED ORDER — BENZONATATE 100 MG PO CAPS
200.0000 mg | ORAL_CAPSULE | Freq: Once | ORAL | Status: AC
  Administered 2017-08-21: 200 mg via ORAL
  Filled 2017-08-21: qty 2

## 2017-08-21 MED ORDER — BENZONATATE 100 MG PO CAPS: 200.0000 mg | ORAL_CAPSULE | Freq: Three times a day (TID) | ORAL | 0 refills | Status: DC | PRN

## 2017-08-21 MED ORDER — LISINOPRIL 20 MG PO TABS: 20.0000 mg | ORAL_TABLET | Freq: Every day | ORAL | 0 refills | Status: DC

## 2017-08-21 MED ORDER — HYDROCHLOROTHIAZIDE 25 MG PO TABS: 25.0000 mg | ORAL_TABLET | Freq: Every day | ORAL | 0 refills | Status: DC

## 2017-08-21 MED ORDER — AZITHROMYCIN 250 MG PO TABS: ORAL_TABLET | ORAL | 0 refills | Status: DC

## 2017-08-21 MED ORDER — AMLODIPINE BESYLATE 5 MG PO TABS: 5.0000 mg | ORAL_TABLET | Freq: Two times a day (BID) | ORAL | 0 refills | Status: DC

## 2017-08-21 NOTE — ED Triage Notes (Addendum)
Pt c/o cough/congestion. Denies prod cough. Daughter just had bronchitis. States also needs meds filled.pt also c/o sore thorat and left ear pain when she swallows Nad.

## 2017-08-23 NOTE — ED Provider Notes (Signed)
[facility] EMERGENCY DEPARTMENT Provider Note   CSN: [id number] Arrival date & time: 08/21/17  1217     History   Chief Complaint Chief Complaint  Patient presents with  . Cough    HPI [name] is a 57 y.o. female presenting with a one week history of a nonproductive cough with nasal congestion, clear rhinorrhea, sore throat and left ear pain which is worsened with swallowing. Her daughter was recently diagnosed with bronchitis to which she was exposed.  She has tried otc generic cough syrup with no relief in her symptoms.  Secondly, has run out of her blood pressure medicines since she was released from prison, is currently taking a friends lisinopril 10 mg tabs, but is supposed to be taking 20 mg.  She denies headache, sob, cp.  She will be establishing medical care once she has insurance with her new employer.   The history is provided by the patient.    Past Medical History:  Diagnosis Date  . Anxiety   . Arthritis   . Constipation   . COPD (chronic obstructive pulmonary disease) (HCC)   . Family history of adverse reaction to anesthesia    brother was "allergic" to anesthesia - heart stopped beating on the surgery table  . Gallbladder sludge   . GERD (gastroesophageal reflux disease)   . Headache    used to have migraines  . Hypertension   . Palpitations   . Staph infection     Patient Active Problem List   Diagnosis Date Noted  . Proximal phalanx fracture of finger 03/31/2015    Past Surgical History:  Procedure Laterality Date  . GROIN DEBRIDEMENT     due to spider bite,   . OPEN REDUCTION INTERNAL FIXATION (ORIF) PROXIMAL PHALANX Right 04/07/2015   Procedure: OPEN REDUCTION INTERNAL FIXATION (ORIF) PROXIMAL PHALANX;  Surgeon: Cammy Copa, MD;  Location: [facility] OR;  Service: Orthopedics;  Laterality: Right;  . TONSILLECTOMY    . TUBAL LIGATION       OB History    Gravida      Para      Term      Preterm      AB      Living  3     SAB      TAB      Ectopic      Multiple      Live Births               Home Medications    Prior to Admission medications   Medication Sig Start Date End Date Taking? Authorizing Provider  albuterol (PROVENTIL HFA;VENTOLIN HFA) 108 (90 Base) MCG/ACT inhaler Inhale 1-2 puffs into the lungs every 6 (six) hours as needed for wheezing or shortness of breath.   Yes [provider]  ranitidine (ZANTAC) 150 MG tablet Take 150 mg by mouth daily.   Yes [provider]  vitamin B-12 (CYANOCOBALAMIN) 1000 MCG tablet Take 1,000 mcg by mouth daily.   Yes [provider]  amLODipine (NORVASC) 5 MG tablet Take 1 tablet (5 mg total) by mouth 2 (two) times daily. 08/21/17   Burgess Amor, PA-C  azithromycin (ZITHROMAX Z-PAK) 250 MG tablet 1 tablet daily for 4 days 08/21/17   Burgess Amor, PA-C  benzonatate (TESSALON) 100 MG capsule Take 2 capsules (200 mg total) by mouth 3 (three) times daily as needed. 08/21/17   Burgess Amor, PA-C  hydrochlorothiazide (HYDRODIURIL) 25 MG tablet Take 1 tablet (25 mg total)  by mouth daily. 08/21/17   Burgess Amor, PA-C  lisinopril (PRINIVIL,ZESTRIL) 20 MG tablet Take 1 tablet (20 mg total) by mouth daily. 08/21/17   Burgess Amor, PA-C    Family History Family History  Problem Relation Age of Onset  . COPD Mother   . Diabetes Father     Social History Social History   Tobacco Use  . Smoking status: Current Some Day Smoker    Packs/day: 0.25    Types: Cigarettes  . Smokeless tobacco: Never Used  Substance Use Topics  . Alcohol use: Yes    Comment: wine occ  . Drug use: No     Allergies   Penicillins   Review of Systems Review of Systems  Constitutional: Negative for chills and fever.  HENT: Positive for congestion, ear pain, rhinorrhea and sore throat. Negative for sinus pressure, trouble swallowing and voice change.   Eyes: Negative for discharge.  Respiratory: Positive for cough. Negative for shortness of breath, wheezing  and stridor.   Cardiovascular: Negative for chest pain.  Gastrointestinal: Negative for abdominal pain.  Genitourinary: Negative.      Physical Exam Updated Vital Signs BP (!) 177/109 (BP Location: Right Arm)   Pulse 78   Temp 98.2 F (36.8 C) (Oral)   Resp 19   SpO2 97%   Physical Exam  Constitutional: She is oriented to person, place, and time. She appears well-developed and well-nourished.  HENT:  Head: Normocephalic and atraumatic.  Right Ear: Tympanic membrane and ear canal normal.  Left Ear: Ear canal normal. Tympanic membrane is injected, erythematous and bulging.  Nose: Mucosal edema and rhinorrhea present.  Mouth/Throat: Uvula is midline, oropharynx is clear and moist and mucous membranes are normal. No oropharyngeal exudate, posterior oropharyngeal edema, posterior oropharyngeal erythema or tonsillar abscesses.  Eyes: Conjunctivae are normal.  Cardiovascular: Normal rate and normal heart sounds.  Pulmonary/Chest: Effort normal. No respiratory distress. She has no wheezes. She has no rales.  Abdominal: Soft. There is no tenderness.  Musculoskeletal: Normal range of motion.  Neurological: She is alert and oriented to person, place, and time.  Skin: Skin is warm and dry. No rash noted.  Psychiatric: She has a normal mood and affect.     ED Treatments / Results  Labs (all labs ordered are listed, but only abnormal results are displayed) Labs Reviewed  I-STAT CHEM 8, ED - Abnormal; Notable for the following components:      Result Value   Glucose, Bld 101 (*)    Hemoglobin 16.3 (*)    HCT 48.0 (*)    All other components within normal limits    EKG None  Radiology No results found.  Procedures Procedures (including critical care time)  Medications Ordered in ED Medications  benzonatate (TESSALON) capsule 200 mg (200 mg Oral Given 08/21/17 1355)  azithromycin (ZITHROMAX) tablet 500 mg (500 mg Oral Given 08/21/17 1356)     Initial Impression /  Assessment and Plan / ED Course  I have reviewed the triage vital signs and the nursing notes.  Pertinent labs & imaging results that were available during my care of the patient were reviewed by me and considered in my medical decision making (see chart for details).     Pt with probable viral uri with left otitis media as a complication.  She has no sx suggesting htn urgency.  Labs reviewed, renal fx normal.  She was placed on tessalon for cough, z pack given pcn allergy for the otitis media.  bp meds  refilled. Referral to local clinics given for bridging care until she can obtain primary pcp.  Prn f/u here, return precautions outlined.  Final Clinical Impressions(s) / ED Diagnoses   Final diagnoses:  Cough  Non-recurrent acute suppurative otitis media of left ear without spontaneous rupture of tympanic membrane  Medication refill    ED Discharge Orders        Ordered    azithromycin (ZITHROMAX Z-PAK) 250 MG tablet     08/21/17 1421    benzonatate (TESSALON) 100 MG capsule  3 times daily PRN     08/21/17 1421    amLODipine (NORVASC) 5 MG tablet  2 times daily     08/21/17 1421    hydrochlorothiazide (HYDRODIURIL) 25 MG tablet  Daily     08/21/17 1421    lisinopril (PRINIVIL,ZESTRIL) 20 MG tablet  Daily     08/21/17 1421       Burgess Amor, PA-C 08/23/17 1400    Samuel Jester, DO 08/26/17 [phone]

## 2017-09-11 ENCOUNTER — Other Ambulatory Visit: Payer: Self-pay

## 2017-09-11 ENCOUNTER — Inpatient Hospital Stay (HOSPITAL_COMMUNITY)
Admission: EM | Admit: 2017-09-11 | Discharge: 2017-09-13 | DRG: 419 | Disposition: A | Discharge status: Home / Self Care | Payer: Self-pay | Attending: General Surgery | Admitting: General Surgery

## 2017-09-11 ENCOUNTER — Encounter (HOSPITAL_COMMUNITY): Payer: Self-pay | Admitting: Emergency Medicine

## 2017-09-11 ENCOUNTER — Emergency Department (HOSPITAL_COMMUNITY): Payer: Self-pay

## 2017-09-11 DIAGNOSIS — K819 Cholecystitis, unspecified: Secondary | ICD-10-CM | POA: Diagnosis present

## 2017-09-11 DIAGNOSIS — K8000 Calculus of gallbladder with acute cholecystitis without obstruction: Principal | ICD-10-CM | POA: Diagnosis present

## 2017-09-11 DIAGNOSIS — Z825 Family history of asthma and other chronic lower respiratory diseases: Secondary | ICD-10-CM

## 2017-09-11 DIAGNOSIS — Z8619 Personal history of other infectious and parasitic diseases: Secondary | ICD-10-CM

## 2017-09-11 DIAGNOSIS — Z9851 Tubal ligation status: Secondary | ICD-10-CM

## 2017-09-11 DIAGNOSIS — K219 Gastro-esophageal reflux disease without esophagitis: Secondary | ICD-10-CM | POA: Diagnosis present

## 2017-09-11 DIAGNOSIS — Z9089 Acquired absence of other organs: Secondary | ICD-10-CM

## 2017-09-11 DIAGNOSIS — J449 Chronic obstructive pulmonary disease, unspecified: Secondary | ICD-10-CM | POA: Diagnosis present

## 2017-09-11 DIAGNOSIS — F17210 Nicotine dependence, cigarettes, uncomplicated: Secondary | ICD-10-CM | POA: Diagnosis present

## 2017-09-11 DIAGNOSIS — Z880 Allergy status to penicillin: Secondary | ICD-10-CM

## 2017-09-11 DIAGNOSIS — I10 Essential (primary) hypertension: Secondary | ICD-10-CM | POA: Diagnosis present

## 2017-09-11 DIAGNOSIS — Z833 Family history of diabetes mellitus: Secondary | ICD-10-CM

## 2017-09-11 HISTORY — DX: Cholecystitis, unspecified: K81.9

## 2017-09-11 LAB — COMPREHENSIVE METABOLIC PANEL
ALBUMIN: 3.9 g/dL (ref 3.5–5.0)
ALT: 58 U/L — ABNORMAL HIGH (ref 14–54)
AST: 42 U/L — AB (ref 15–41)
Alkaline Phosphatase: 99 U/L (ref 38–126)
Anion gap: 11 (ref 5–15)
BILIRUBIN TOTAL: 0.9 mg/dL (ref 0.3–1.2)
BUN: 15 mg/dL (ref 6–20)
CALCIUM: 9.3 mg/dL (ref 8.9–10.3)
CO2: 26 mmol/L (ref 22–32)
CREATININE: 0.67 mg/dL (ref 0.44–1.00)
Chloride: 99 mmol/L — ABNORMAL LOW (ref 101–111)
GFR calc Af Amer: >60 mL/min (ref >60)
GFR calc non Af Amer: >60 mL/min (ref >60)
Glucose, Bld: 110 mg/dL — ABNORMAL HIGH (ref 65–99)
Potassium: 3.9 mmol/L (ref 3.5–5.1)
SODIUM: 136 mmol/L (ref 135–145)
Total Protein: 8 g/dL (ref 6.5–8.1)

## 2017-09-11 LAB — CBC WITH DIFFERENTIAL/PLATELET
BASOS ABS: 0 10*3/uL (ref 0.0–0.1)
BASOS PCT: 0 %
EOS ABS: 0.2 10*3/uL (ref 0.0–0.7)
EOS PCT: 2 %
HCT: 47.7 % — ABNORMAL HIGH (ref 36.0–46.0)
Hemoglobin: 15.9 g/dL — ABNORMAL HIGH (ref 12.0–15.0)
LYMPHS PCT: 34 %
Lymphs Abs: 3.6 10*3/uL (ref 0.7–4.0)
MCH: 30.3 pg (ref 26.0–34.0)
MCHC: 33.3 g/dL (ref 30.0–36.0)
MCV: 91 fL (ref 78.0–100.0)
MONO ABS: 0.8 10*3/uL (ref 0.1–1.0)
Monocytes Relative: 8 %
Neutro Abs: 6.1 10*3/uL (ref 1.7–7.7)
Neutrophils Relative %: 56 %
PLATELETS: 314 10*3/uL (ref 150–400)
RBC: 5.24 MIL/uL — AB (ref 3.87–5.11)
RDW: 12.6 % (ref 11.5–15.5)
WBC: 10.8 10*3/uL — AB (ref 4.0–10.5)

## 2017-09-11 LAB — URINALYSIS, ROUTINE W REFLEX MICROSCOPIC
BILIRUBIN URINE: NEGATIVE
Glucose, UA: NEGATIVE mg/dL
HGB URINE DIPSTICK: NEGATIVE
KETONES UR: NEGATIVE mg/dL
Nitrite: NEGATIVE
PH: 6.5 (ref 5.0–8.0)
Protein, ur: NEGATIVE mg/dL
Specific Gravity, Urine: 1.02 (ref 1.005–1.030)

## 2017-09-11 LAB — TROPONIN I: Troponin I: <0.03 ng/mL (ref <0.03)

## 2017-09-11 LAB — URINALYSIS, MICROSCOPIC (REFLEX)

## 2017-09-11 LAB — LIPASE, BLOOD: Lipase: 340 U/L — ABNORMAL HIGH (ref 11–51)

## 2017-09-11 MED ORDER — DOCUSATE SODIUM 100 MG PO CAPS
100.0000 mg | ORAL_CAPSULE | Freq: Two times a day (BID) | ORAL | Status: DC
  Administered 2017-09-11 – 2017-09-13 (×4): 100 mg via ORAL
  Filled 2017-09-11 (×4): qty 1

## 2017-09-11 MED ORDER — HYDRALAZINE HCL 20 MG/ML IJ SOLN: 10.0000 mg | INTRAMUSCULAR | Status: DC | PRN

## 2017-09-11 MED ORDER — OXYCODONE HCL 5 MG PO TABS
5.0000 mg | ORAL_TABLET | ORAL | Status: DC | PRN
Start: 2017-09-11 — End: 2017-09-13
  Administered 2017-09-12: 10 mg via ORAL
  Administered 2017-09-12: 5 mg via ORAL
  Administered 2017-09-12 – 2017-09-13 (×3): 10 mg via ORAL
  Filled 2017-09-11 (×6): qty 2

## 2017-09-11 MED ORDER — MORPHINE SULFATE (PF) 2 MG/ML IV SOLN
2.0000 mg | INTRAVENOUS | Status: DC | PRN
  Administered 2017-09-11 – 2017-09-12 (×3): 2 mg via INTRAVENOUS
  Filled 2017-09-11 (×3): qty 1

## 2017-09-11 MED ORDER — DEXTROSE-NACL 5-0.45 % IV SOLN
INTRAVENOUS | Status: DC
  Administered 2017-09-11 – 2017-09-12 (×2): via INTRAVENOUS

## 2017-09-11 MED ORDER — CIPROFLOXACIN IN D5W 400 MG/200ML IV SOLN
400.0000 mg | Freq: Two times a day (BID) | INTRAVENOUS | Status: DC
  Administered 2017-09-11 – 2017-09-13 (×5): 400 mg via INTRAVENOUS
  Filled 2017-09-11 (×4): qty 200

## 2017-09-11 MED ORDER — SODIUM CHLORIDE 0.9 % IV BOLUS
1000.0000 mL | Freq: Once | INTRAVENOUS | Status: AC
  Administered 2017-09-11: 1000 mL via INTRAVENOUS

## 2017-09-11 MED ORDER — FAMOTIDINE 20 MG PO TABS
20.0000 mg | ORAL_TABLET | Freq: Every day | ORAL | Status: DC
  Administered 2017-09-12 – 2017-09-13 (×2): 20 mg via ORAL
  Filled 2017-09-11 (×2): qty 1

## 2017-09-11 MED ORDER — LISINOPRIL 10 MG PO TABS
20.0000 mg | ORAL_TABLET | Freq: Every day | ORAL | Status: DC
  Administered 2017-09-13: 20 mg via ORAL
  Filled 2017-09-11 (×2): qty 2

## 2017-09-11 MED ORDER — NICOTINE 21 MG/24HR TD PT24
21.0000 mg | MEDICATED_PATCH | Freq: Every day | TRANSDERMAL | Status: DC
  Administered 2017-09-11 – 2017-09-13 (×3): 21 mg via TRANSDERMAL
  Filled 2017-09-11 (×3): qty 1

## 2017-09-11 MED ORDER — ONDANSETRON HCL 4 MG/2ML IJ SOLN
4.0000 mg | Freq: Four times a day (QID) | INTRAMUSCULAR | Status: DC | PRN
  Administered 2017-09-11 – 2017-09-13 (×5): 4 mg via INTRAVENOUS
  Filled 2017-09-11 (×5): qty 2

## 2017-09-11 MED ORDER — ONDANSETRON HCL 4 MG/2ML IJ SOLN
4.0000 mg | Freq: Once | INTRAMUSCULAR | Status: AC
  Administered 2017-09-11: 4 mg via INTRAVENOUS
  Filled 2017-09-11: qty 2

## 2017-09-11 MED ORDER — HYDROCHLOROTHIAZIDE 25 MG PO TABS
25.0000 mg | ORAL_TABLET | Freq: Every day | ORAL | Status: DC
  Administered 2017-09-12 – 2017-09-13 (×2): 25 mg via ORAL
  Filled 2017-09-11 (×2): qty 1

## 2017-09-11 MED ORDER — DIPHENHYDRAMINE HCL 50 MG/ML IJ SOLN: 12.5000 mg | Freq: Four times a day (QID) | INTRAMUSCULAR | Status: DC | PRN

## 2017-09-11 MED ORDER — ZOLPIDEM TARTRATE 5 MG PO TABS
5.0000 mg | ORAL_TABLET | Freq: Every evening | ORAL | Status: DC | PRN
  Administered 2017-09-12: 5 mg via ORAL
  Filled 2017-09-11: qty 1

## 2017-09-11 MED ORDER — ACETAMINOPHEN 325 MG PO TABS
650.0000 mg | ORAL_TABLET | Freq: Four times a day (QID) | ORAL | Status: DC | PRN
  Administered 2017-09-12: 650 mg via ORAL

## 2017-09-11 MED ORDER — HEPARIN SODIUM (PORCINE) 5000 UNIT/ML IJ SOLN
5000.0000 [IU] | Freq: Three times a day (TID) | INTRAMUSCULAR | Status: DC
  Administered 2017-09-11 – 2017-09-12 (×3): 5000 [IU] via SUBCUTANEOUS
  Filled 2017-09-11 (×3): qty 1

## 2017-09-11 MED ORDER — MORPHINE SULFATE (PF) 4 MG/ML IV SOLN
4.0000 mg | Freq: Once | INTRAVENOUS | Status: AC
  Administered 2017-09-11: 4 mg via INTRAVENOUS
  Filled 2017-09-11: qty 1

## 2017-09-11 MED ORDER — ONDANSETRON 4 MG PO TBDP: 4.0000 mg | ORAL_TABLET | Freq: Four times a day (QID) | ORAL | Status: DC | PRN

## 2017-09-11 MED ORDER — AMLODIPINE BESYLATE 5 MG PO TABS
5.0000 mg | ORAL_TABLET | Freq: Two times a day (BID) | ORAL | Status: DC
  Administered 2017-09-12 – 2017-09-13 (×3): 5 mg via ORAL
  Filled 2017-09-11 (×3): qty 1

## 2017-09-11 MED ORDER — DIPHENHYDRAMINE HCL 12.5 MG/5ML PO ELIX: 12.5000 mg | ORAL_SOLUTION | Freq: Four times a day (QID) | ORAL | Status: DC | PRN

## 2017-09-11 MED ORDER — ACETAMINOPHEN 650 MG RE SUPP: 650.0000 mg | Freq: Four times a day (QID) | RECTAL | Status: DC | PRN

## 2017-09-11 MED ORDER — PNEUMOCOCCAL VAC POLYVALENT 25 MCG/0.5ML IJ INJ: 0.5000 mL | INJECTION | INTRAMUSCULAR | Status: DC

## 2017-09-11 MED ORDER — SIMETHICONE 80 MG PO CHEW: 40.0000 mg | CHEWABLE_TABLET | Freq: Four times a day (QID) | ORAL | Status: DC | PRN

## 2017-09-11 NOTE — H&P (Signed)
[facility] History and Physical  Reason for Referral: Cholecystitis  Referring Physician: Dr. Haviland   Chief Complaint    Emesis; Abdominal Pain; Back Pain      [name] is a 57 y.o. female.  HPI: [name] is a 57 yo who has been having some RUQ and epigastric pain since Saturday. [name] has been getting worse and finally prompted [name] to go to Brianna ED. [name] also endorses poor [name] intake with nausea/vomiting, and says that [name] is unable to keep anything down.  [name] says Brianna pain is going through to [name] back, and that [name] has had some issues with [name] gallbladder/ reflux in Brianna past. [name] was given ranitidine and that this did not improve [name] symptoms, but that Brianna symptoms have never been as bad as this episode.     [name] has a history of COPD but takes no inhalers per report. [name] denies any chest pain or SOB. [name] does not take any excessive NSAIDs.   Past Medical History:  Diagnosis Date  . Anxiety   . Arthritis   . Constipation   . COPD (chronic obstructive pulmonary disease) (HCC)   . Family history of adverse reaction to anesthesia    brother was "allergic" to anesthesia - heart stopped beating on Brianna surgery table  . Gallbladder sludge   . GERD (gastroesophageal reflux disease)   . Headache    used to have migraines  . Hypertension   . Palpitations   . Staph infection     Past Surgical History:  Procedure Laterality Date  . GROIN DEBRIDEMENT     due to spider bite,   . OPEN REDUCTION INTERNAL FIXATION (ORIF) PROXIMAL PHALANX Right 04/07/2015   Procedure: OPEN REDUCTION INTERNAL FIXATION (ORIF) PROXIMAL PHALANX;  Surgeon: Scott Gregory Dean, MD;  Location: [facility] OR;  Service: Orthopedics;  Laterality: Right;  . TONSILLECTOMY    . TUBAL LIGATION      Family History  Problem Relation Age of Onset  . COPD Mother   . Diabetes Father     Social History   Tobacco Use  . Smoking status: Current Some Day Smoker    Packs/day: 0.25    Types:  Cigarettes  . Smokeless tobacco: Never Used  Substance Use Topics  . Alcohol use: Yes    Comment: wine occ  . Drug use: No    Medications:  I have reviewed [name]'s current medications. Prior to Admission:  (Not in a hospital admission) Scheduled: . [START ON 09/12/2017] amLODipine  5 mg Oral BID  . docusate sodium  100 mg Oral BID  . [START ON 09/12/2017] famotidine  20 mg Oral Daily  . heparin  5,000 Units Subcutaneous Q8H  . [START ON 09/12/2017] hydrochlorothiazide  25 mg Oral Daily  . [START ON 09/12/2017] lisinopril  20 mg Oral Daily  .  morphine injection  4 mg Intravenous Once  . nicotine  21 mg Transdermal Daily   Continuous: . ciprofloxacin    . dextrose 5 % and 0.45% NaCl     PRN:acetaminophen **OR** acetaminophen, diphenhydrAMINE **OR** diphenhydrAMINE, hydrALAZINE, morphine injection, ondansetron **OR** ondansetron (ZOFRAN) IV, oxyCODONE, simethicone, zolpidem  Allergies  Allergen Reactions  . Penicillins Swelling    Throat swelling Has [name] had a PCN reaction causing immediate rash, facial/tongue/throat swelling, SOB or lightheadedness with hypotension: Yes Has [name] had a PCN reaction causing severe rash involving mucus membranes or skin necrosis: No Has [name] had a PCN reaction that required hospitalization  ED   visit but no hospitalization Has [name] had a PCN reaction occurring within Brianna last 10 years: No If all of Brianna above answers are "NO", then may proceed with Cephalosporin use.     ROS:  A comprehensive review of systems was negative except for: Constitutional: positive for anorexia Gastrointestinal: positive for abdominal pain, nausea, reflux symptoms and vomiting  Blood pressure 114/81, pulse 92, temperature 98.4 F (36.9 C), temperature source Oral, resp. rate 20, SpO2 91 %. Physical Exam  Constitutional: [name] is oriented to person, place, and time. [name] appears well-developed and well-nourished.  HENT:  Head: Normocephalic.  Eyes:  Pupils are equal, round, and reactive to light.  Cardiovascular: Normal rate and regular rhythm.  Pulmonary/Chest: Effort normal.  Abdominal: Normal appearance. [name] exhibits no distension and no mass. There is tenderness in Brianna right upper quadrant and epigastric area. There is no rigidity and no guarding.  Neurological: [name] is alert and oriented to person, place, and time.  Skin: Skin is warm and dry.  Psychiatric: [name] has a normal mood and affect. [name] behavior is normal.  Vitals reviewed.   Results: Results for orders placed or performed during Brianna hospital encounter of 09/11/17 (from Brianna past 48 hour(s))  CBC with Differential     Status: Abnormal   Collection Time: 09/11/17  9:23 AM  Result Value Ref Range   WBC 10.8 (H) 4.0 - 10.5 K/uL   RBC 5.24 (H) 3.87 - 5.11 MIL/uL   Hemoglobin 15.9 (H) 12.0 - 15.0 g/dL   HCT 47.7 (H) 36.0 - 46.0 %   MCV 91.0 78.0 - 100.0 fL   MCH 30.3 26.0 - 34.0 pg   MCHC 33.3 30.0 - 36.0 g/dL   RDW 12.6 11.5 - 15.5 %   Platelets 314 150 - 400 K/uL   Neutrophils Relative % 56 %   Neutro Abs 6.1 1.7 - 7.7 K/uL   Lymphocytes Relative 34 %   Lymphs Abs 3.6 0.7 - 4.0 K/uL   Monocytes Relative 8 %   Monocytes Absolute 0.8 0.1 - 1.0 K/uL   Eosinophils Relative 2 %   Eosinophils Absolute 0.2 0.0 - 0.7 K/uL   Basophils Relative 0 %   Basophils Absolute 0.0 0.0 - 0.1 K/uL    Comment: Performed at [facility], [address] [place], [place] [address]  Lipase, blood     Status: Abnormal   Collection Time: 09/11/17  9:23 AM  Result Value Ref Range   Lipase 340 (H) 11 - 51 U/L    Comment: Performed at [facility], [address] [place], [place] [address]  Comprehensive metabolic panel     Status: Abnormal   Collection Time: 09/11/17  9:23 AM  Result Value Ref Range   Sodium 136 135 - 145 mmol/L   Potassium 3.9 3.5 - 5.1 mmol/L   Chloride 99 (L) 101 - 111 mmol/L   CO2 26 22 - 32 mmol/L   Glucose, Bld 110 (H) 65 - 99 mg/dL   BUN 15 6 - 20 mg/dL    Creatinine, Ser 0.67 0.44 - 1.00 mg/dL   Calcium 9.3 8.9 - 10.3 mg/dL   Total Protein 8.0 6.5 - 8.1 g/dL   Albumin 3.9 3.5 - 5.0 g/dL   AST 42 (H) 15 - 41 U/L   ALT 58 (H) 14 - 54 U/L   Alkaline Phosphatase 99 38 - 126 U/L   Total Bilirubin 0.9 0.3 - 1.2 mg/dL   GFR calc non Af Amer >60 >60 mL/min     GFR calc Af Amer >60 >60 mL/min    Comment: (NOTE) Brianna eGFR has been calculated using Brianna CKD EPI equation. This calculation has not been validated in all clinical situations. eGFR's persistently <60 mL/min signify possible Chronic Kidney Disease.    Anion gap 11 5 - 15    Comment: Performed at [facility], [address] [place], [place] [address]  Urinalysis, Routine w reflex microscopic     Status: Abnormal   Collection Time: 09/11/17  9:23 AM  Result Value Ref Range   Color, Urine YELLOW YELLOW   APPearance HAZY (A) CLEAR   Specific Gravity, Urine 1.020 1.005 - 1.030   pH 6.5 5.0 - 8.0   Glucose, UA NEGATIVE NEGATIVE mg/dL   Hgb urine dipstick NEGATIVE NEGATIVE   Bilirubin Urine NEGATIVE NEGATIVE   Ketones, ur NEGATIVE NEGATIVE mg/dL   Protein, ur NEGATIVE NEGATIVE mg/dL   Nitrite NEGATIVE NEGATIVE   Leukocytes, UA TRACE (A) NEGATIVE    Comment: Performed at [facility], [address] [place], [place] [address]  Troponin I     Status: None   Collection Time: 09/11/17  9:23 AM  Result Value Ref Range   Troponin I <0.03 <0.03 ng/mL    Comment: Performed at [facility], [address] [place], [place] [address]  Urinalysis, Microscopic (reflex)     Status: Abnormal   Collection Time: 09/11/17  9:23 AM  Result Value Ref Range   RBC / HPF 0-5 0 - 5 RBC/hpf   WBC, UA 0-5 0 - 5 WBC/hpf   Bacteria, UA FEW (A) NONE SEEN   Squamous Epithelial / LPF 11-20 0 - 5   Mucus PRESENT     Comment: Performed at [facility], [address] [place], [place] [address]   Personally reviewed US- stones without fluid or thickening  Us Abdomen Limited  Result Date: 09/11/2017 CLINICAL  DATA:  Right upper quadrant pain with nausea and vomiting for Brianna past 2 days. EXAM: ULTRASOUND ABDOMEN LIMITED RIGHT UPPER QUADRANT COMPARISON:  CT abdomen pelvis dated November 29, 2013. FINDINGS: Gallbladder: Sludge with small mobile gallstones. No wall thickening visualized. Positive sonographic Murphy sign noted by sonographer. Common bile duct: Diameter: 6-7 mm, at Brianna upper limits of normal in size. Liver: No focal lesion identified. Within normal limits in parenchymal echogenicity. Portal vein is patent on color Doppler imaging with normal direction of blood flow towards Brianna liver. IMPRESSION: 1. Cholelithiasis with positive sonographic Murphy sign. No wall thickening or pericholecystic fluid. Findings are indeterminate for acute cholecystitis. Consider HIDA scan for further evaluation. Electronically Signed   By: William T Derry M.D.   On: 09/11/2017 10:26     Assessment & Plan:  Sopheap Cozzolino is a 57 y.o. female with cholelithiasis and tenderness concerning for some degree of acute on chronic cholecystitis and some what elevated lipase. [name] has a CBD that is upper limits of normal and normal T bili.  Lipase could be elevated from Brianna vomiting.  -Will admit -PRN for pain -Trend labs to see if T bili rising or lipase rising, if going up will need to think about MRCP  -Clear diet -Lap chole at Brianna earliest on 6/19 -Cipro for cholecystitis due to PCN allergy  -Home meds ordered  -CXR from 5/27 with clear lungs   PLAN: I counseled [name] about Brianna indication, risks and benefits of laparoscopic cholecystectomy.  [name] understands there is a very small chance for bleeding, infection, injury to normal structures (including common bile duct), conversion   to open surgery, persistent symptoms, evolution of postcholecystectomy diarrhea, need for secondary interventions, anesthesia reaction, cardiopulmonary issues and other risks not specifically detailed here. I described Brianna expected recovery, Brianna  plan for follow-up and Brianna restrictions during Brianna recovery phase.  All questions were answered.  All questions were answered to Brianna satisfaction of [name] and family.    Virl Cagey 09/11/2017, 2:46 PM

## 2017-09-11 NOTE — ED Triage Notes (Addendum)
Pt c/o ruq pain radiating into back with vomiting x 2 days. N/v x 4 in 24 hrs.

## 2017-09-11 NOTE — Progress Notes (Signed)
**Note De-Identified Dillan Lunden Obfuscation** IS instruction given; patient tolerated well

## 2017-09-11 NOTE — ED Provider Notes (Signed)
[facility] Provider Note   CSN: [id number] Arrival date & time: 09/11/17  0850     History   Chief Complaint Chief Complaint  Patient presents with  . Emesis  . Abdominal Pain  . Back Pain    HPI Chanetta MarshallKaren Shughart is a 57 y.o. female.  HPI   Chanetta MarshallKaren Graveline is a 57 y.o. female who presents to the Emergency Department complaining of right upper abdominal and right flank pain.  Symptoms began two days ago and describes as sharp, stabbing pains that are constant.  She describes the pain as radiating from her abdomen to her back.  Her symptoms have been associated with nausea and vomiting since onset.  She is able to tolerate fluids in small amounts but states she vomits anytime she tries to eat solid foods.  She notes having constipation on Friday and she took a laxative which provided relief, but notes that is when her abdominal pain began.  She admits a hx of gallbladder issues and feels that her current pain is related to this.  She denies fever, chills, persistent diarrhea, hematochezia.  Denies diet of alcohol or excessive, greasy foods.  No previous abdominal surgeries.     Past Medical History:  Diagnosis Date  . Anxiety   . Arthritis   . Constipation   . COPD (chronic obstructive pulmonary disease) (HCC)   . Family history of adverse reaction to anesthesia    brother was "allergic" to anesthesia - heart stopped beating on the surgery table  . Gallbladder sludge   . GERD (gastroesophageal reflux disease)   . Headache    used to have migraines  . Hypertension   . Palpitations   . Staph infection     Patient Active Problem List   Diagnosis Date Noted  . Proximal phalanx fracture of finger 03/31/2015    Past Surgical History:  Procedure Laterality Date  . GROIN DEBRIDEMENT     due to spider bite,   . OPEN REDUCTION INTERNAL FIXATION (ORIF) PROXIMAL PHALANX Right 04/07/2015   Procedure: OPEN REDUCTION INTERNAL FIXATION (ORIF) PROXIMAL PHALANX;   Surgeon: Cammy CopaScott Gregory Dean, MD;  Location: [facility] OR;  Service: Orthopedics;  Laterality: Right;  . TONSILLECTOMY    . TUBAL LIGATION       OB History    Gravida      Para      Term      Preterm      AB      Living  3     SAB      TAB      Ectopic      Multiple      Live Births               Home Medications    Prior to Admission medications   Medication Sig Start Date End Date Taking? Authorizing Provider  albuterol (PROVENTIL HFA;VENTOLIN HFA) 108 (90 Base) MCG/ACT inhaler Inhale 1-2 puffs into the lungs every 6 (six) hours as needed for wheezing or shortness of breath.    [provider]  amLODipine (NORVASC) 5 MG tablet Take 1 tablet (5 mg total) by mouth 2 (two) times daily. 08/21/17   Burgess AmorIdol, Julie, PA-C  azithromycin (ZITHROMAX Z-PAK) 250 MG tablet 1 tablet daily for 4 days 08/21/17   Burgess AmorIdol, Julie, PA-C  benzonatate (TESSALON) 100 MG capsule Take 2 capsules (200 mg total) by mouth 3 (three) times daily as needed. 08/21/17   Burgess AmorIdol, Julie, PA-C  hydrochlorothiazide (HYDRODIURIL)  25 MG tablet Take 1 tablet (25 mg total) by mouth daily. 08/21/17   Burgess Amor, PA-C  lisinopril (PRINIVIL,ZESTRIL) 20 MG tablet Take 1 tablet (20 mg total) by mouth daily. 08/21/17   Burgess Amor, PA-C  ranitidine (ZANTAC) 150 MG tablet Take 150 mg by mouth daily.    [provider]  vitamin B-12 (CYANOCOBALAMIN) 1000 MCG tablet Take 1,000 mcg by mouth daily.    [provider]    Family History Family History  Problem Relation Age of Onset  . COPD Mother   . Diabetes Father     Social History Social History   Tobacco Use  . Smoking status: Current Some Day Smoker    Packs/day: 0.25    Types: Cigarettes  . Smokeless tobacco: Never Used  Substance Use Topics  . Alcohol use: Yes    Comment: wine occ  . Drug use: No     Allergies   Penicillins   Review of Systems Review of Systems  Constitutional: Negative for appetite change, chills and fever.    Respiratory: Negative for shortness of breath.   Cardiovascular: Negative for chest pain.  Gastrointestinal: Positive for abdominal pain, nausea and vomiting. Negative for blood in stool.  Genitourinary: Positive for flank pain. Negative for decreased urine volume, difficulty urinating, dysuria, vaginal bleeding and vaginal discharge.  Skin: Negative for color change and rash.  Neurological: Negative for dizziness, weakness and numbness.  Hematological: Negative for adenopathy.  All other systems reviewed and are negative.    Physical Exam Updated Vital Signs BP 133/85 (BP Location: Left Arm)   Pulse (!) 106   Temp 98.4 F (36.9 C) (Oral)   Resp 18   SpO2 96%   Physical Exam  Constitutional: She is oriented to person, place, and time. She appears well-developed and well-nourished.  Uncomfortable appearing  HENT:  Head: Normocephalic and atraumatic.  Mouth/Throat: Oropharynx is clear and moist.  Cardiovascular: Normal rate, regular rhythm and intact distal pulses.  No murmur heard. Pulmonary/Chest: Effort normal and breath sounds normal. No respiratory distress.  Abdominal: Soft. Normal appearance and bowel sounds are normal. She exhibits no distension and no mass. There is tenderness. There is no rigidity, no rebound, no guarding and no tenderness at McBurney's point.  ttp of RUQ.  No guarding or rebound.  Abdomen is soft.    Musculoskeletal: Normal range of motion. She exhibits no edema.  Neurological: She is alert and oriented to person, place, and time. She exhibits normal muscle tone. Coordination normal.  Skin: Skin is warm and dry. Capillary refill takes less than 2 seconds.  Nursing note and vitals reviewed.    ED Treatments / Results  Labs (all labs ordered are listed, but only abnormal results are displayed) Labs Reviewed  CBC WITH DIFFERENTIAL/PLATELET - Abnormal; Notable for the following components:      Result Value   WBC 10.8 (*)    RBC 5.24 (*)     Hemoglobin 15.9 (*)    HCT 47.7 (*)    All other components within normal limits  LIPASE, BLOOD - Abnormal; Notable for the following components:   Lipase 340 (*)    All other components within normal limits  COMPREHENSIVE METABOLIC PANEL - Abnormal; Notable for the following components:   Chloride 99 (*)    Glucose, Bld 110 (*)    AST 42 (*)    ALT 58 (*)    All other components within normal limits  URINALYSIS, ROUTINE W REFLEX MICROSCOPIC - Abnormal; Notable  for the following components:   APPearance HAZY (*)    Leukocytes, UA TRACE (*)    All other components within normal limits  URINALYSIS, MICROSCOPIC (REFLEX) - Abnormal; Notable for the following components:   Bacteria, UA FEW (*)    All other components within normal limits  TROPONIN I    EKG None  Radiology US Abdomen Limited  Result Date: 09/11/2017 CLINICAL DATA:  Right upper quadrant pain with nausea and vomiting for the past 2 days. EXAM: ULTRASOUND ABDOMEN LIMITED RIGHT UPPER QUADRANT COMPARISON:  CT abdomen pelvis dated November 29, 2013. FINDINGS: Gallbladder: Sludge with small mobile gallstones. No wall thickening visualized. Positive sonographic Murphy sign noted by sonographer. Common bile duct: Diameter: 6-7 mm, at the upper limits of normal in size. Liver: No focal lesion identified. Within normal limits in parenchymal echogenicity. Portal vein is patent on color Doppler imaging with normal direction of blood flow towards the liver. IMPRESSION: 1. Cholelithiasis with positive sonographic Murphy sign. No wall thickening or pericholecystic fluid. Findings are indeterminate for acute cholecystitis. Consider HIDA scan for further evaluation. Electronically Signed   By: Obie Dredge M.D.   On: 09/11/2017 10:26    Procedures Procedures (including critical care time)  Medications Ordered in ED Medications  sodium chloride 0.9 % bolus 1,000 mL (has no administration in time range)  morphine 4 MG/ML injection 4 mg  (has no administration in time range)  ondansetron (ZOFRAN) injection 4 mg (has no administration in time range)     Initial Impression / Assessment and Plan / ED Course  I have reviewed the triage vital signs and the nursing notes.  Pertinent labs & imaging results that were available during my care of the patient were reviewed by me and considered in my medical decision making (see chart for details).     1150  Consulted Dr. Henreitta Leber and discussed pt findings.  Will evaluate pt in the ER.  Pt is pain free at this time, discussed trying po challenge.    Tolerated po challenge.    Pt evaluated by Dr. Henreitta Leber in ER, she will admit.     Final Clinical Impressions(s) / ED Diagnoses   Final diagnoses:  Cholecystitis    ED Discharge Orders    None       Pauline Aus, PA-C 09/11/17 1502    Jacalyn Lefevre, MD 09/11/17 1507

## 2017-09-12 LAB — COMPREHENSIVE METABOLIC PANEL
ALBUMIN: 3.4 g/dL — AB (ref 3.5–5.0)
ALK PHOS: 82 U/L (ref 38–126)
ALT: 44 U/L (ref 14–54)
ANION GAP: 6 (ref 5–15)
AST: 30 U/L (ref 15–41)
BILIRUBIN TOTAL: 0.6 mg/dL (ref 0.3–1.2)
BUN: 13 mg/dL (ref 6–20)
CALCIUM: 8.9 mg/dL (ref 8.9–10.3)
CO2: 25 mmol/L (ref 22–32)
CREATININE: 0.67 mg/dL (ref 0.44–1.00)
Chloride: 108 mmol/L (ref 101–111)
GFR calc Af Amer: >60 mL/min (ref >60)
GFR calc non Af Amer: >60 mL/min (ref >60)
Glucose, Bld: 99 mg/dL (ref 65–99)
Potassium: 4.5 mmol/L (ref 3.5–5.1)
Sodium: 139 mmol/L (ref 135–145)
TOTAL PROTEIN: 7.2 g/dL (ref 6.5–8.1)

## 2017-09-12 LAB — SURGICAL PCR SCREEN
MRSA, PCR: NEGATIVE
STAPHYLOCOCCUS AUREUS: NEGATIVE

## 2017-09-12 LAB — HIV ANTIBODY (ROUTINE TESTING W REFLEX): HIV Screen 4th Generation wRfx: NONREACTIVE

## 2017-09-12 LAB — CBC WITH DIFFERENTIAL/PLATELET
BASOS ABS: 0.1 10*3/uL (ref 0.0–0.1)
BASOS PCT: 1 %
EOS ABS: 0.3 10*3/uL (ref 0.0–0.7)
EOS PCT: 3 %
HEMATOCRIT: 45.4 % (ref 36.0–46.0)
Hemoglobin: 14.5 g/dL (ref 12.0–15.0)
Lymphocytes Relative: 50 %
Lymphs Abs: 4.7 10*3/uL — ABNORMAL HIGH (ref 0.7–4.0)
MCH: 29.9 pg (ref 26.0–34.0)
MCHC: 31.9 g/dL (ref 30.0–36.0)
MCV: 93.6 fL (ref 78.0–100.0)
Monocytes Absolute: 0.9 10*3/uL (ref 0.1–1.0)
Monocytes Relative: 9 %
NEUTROS ABS: 3.5 10*3/uL (ref 1.7–7.7)
Neutrophils Relative %: 37 %
PLATELETS: 278 10*3/uL (ref 150–400)
RBC: 4.85 MIL/uL (ref 3.87–5.11)
RDW: 12.9 % (ref 11.5–15.5)
WBC: 9.5 10*3/uL (ref 4.0–10.5)

## 2017-09-12 LAB — LIPASE, BLOOD: LIPASE: 150 U/L — AB (ref 11–51)

## 2017-09-12 MED ORDER — CHLORHEXIDINE GLUCONATE CLOTH 2 % EX PADS
6.0000 | MEDICATED_PAD | Freq: Once | CUTANEOUS | Status: AC
  Administered 2017-09-12: 6 via TOPICAL

## 2017-09-12 MED ORDER — CHLORHEXIDINE GLUCONATE CLOTH 2 % EX PADS
6.0000 | MEDICATED_PAD | Freq: Once | CUTANEOUS | Status: AC
  Administered 2017-09-13: 6 via TOPICAL

## 2017-09-12 NOTE — Progress Notes (Signed)
[facility] Progress Note     Subjective: Feeling better. Pain improved with meds. Taking in some clears. No nausea.   Objective: Vital signs in last 24 hours: Temp:  [98.5 F (36.9 C)-98.8 F (37.1 C)] 98.6 F (37 C) (06/18 1516) Pulse Rate:  [57-74] 67 (06/18 1516) Resp:  [18] 18 (06/18 1516) BP: (106-122)/(65-82) 122/70 (06/18 1516) SpO2:  [90 %-95 %] 93 % (06/18 1516) Last BM Date: 09/10/17  Intake/Output from previous day: 06/17 0701 - 06/18 0700 In: 2182.5 [P.O.:480; I.V.:502.5; IV Piggyback:1200] Out: 3 [Urine:3] Intake/Output this shift: Total I/O In: 303.3 [IV Piggyback:303.3] Out: 2 [Urine:1; Stool:1]  General appearance: alert, cooperative and no distress Resp: normal work breathing GI: soft, tender RUQ, nondistended  Lab Results:  Recent Labs    09/11/17 0923 09/12/17 0529  WBC 10.8* 9.5  HGB 15.9* 14.5  HCT 47.7* 45.4  PLT 314 278   BMET Recent Labs    09/11/17 0923 09/12/17 0529  NA 136 139  K 3.9 4.5  CL 99* 108  CO2 26 25  GLUCOSE 110* 99  BUN 15 13  CREATININE 0.67 0.67  CALCIUM 9.3 8.9    Studies/Results: [place] Abdomen Limited  Result Date: 09/11/2017 CLINICAL DATA:  Right upper quadrant pain with nausea and vomiting for the past 2 days. EXAM: ULTRASOUND ABDOMEN LIMITED RIGHT UPPER QUADRANT COMPARISON:  CT abdomen pelvis dated November 29, 2013. FINDINGS: Gallbladder: Sludge with small mobile gallstones. No wall thickening visualized. Positive sonographic Murphy sign noted by sonographer. Common bile duct: Diameter: 6-7 mm, at the upper limits of normal in size. Liver: No focal lesion identified. Within normal limits in parenchymal echogenicity. Portal vein is patent on color Doppler imaging with normal direction of blood flow towards the liver. IMPRESSION: 1. Cholelithiasis with positive sonographic Murphy sign. No wall thickening or pericholecystic fluid. Findings are indeterminate for acute cholecystitis. Consider HIDA  scan for further evaluation. Electronically Signed   By: Obie DredgeWilliam T Derry M.D.   On: 09/11/2017 10:26    Anti-infectives: Anti-infectives (From admission, onward)   Start     Dose/Rate Route Frequency Ordered Stop   09/11/17 1800  ciprofloxacin (CIPRO) IVPB 400 mg     400 mg 200 mL/hr over 60 Minutes Intravenous Every 12 hours 09/11/17 1446        Assessment/Plan: Ms. Elijah BirkCaldwell is a 57 yo with early acute cholecystitis and possible some degree of pancreatitis given lipase and pain in back that is improving with antibiotics and bowel rest. Feeling better today. PRN for pain IS, OOB HD ok Clears, NPO at midnight OR Tomorrow for Lap chole, no questions from the patient  Cipro IV ordered  EKG sinus rhythm   LOS: 1 day    Lucretia RoersLindsay C Bridges 09/12/2017

## 2017-09-12 NOTE — Progress Notes (Signed)
[facility]  Patient with decreasing lipase and T bili. Tolerated some clears. Plan for Lap chole tomorrow. NPO at midnight.   Will see patient after lunch today.  Algis GreenhouseLindsay Caiden Monsivais, MD [facility] [address] [place], [place] [id number] [phone] (office)

## 2017-09-12 NOTE — Plan of Care (Signed)
progressing 

## 2017-09-13 ENCOUNTER — Inpatient Hospital Stay (HOSPITAL_COMMUNITY): Payer: Self-pay | Admitting: Anesthesiology

## 2017-09-13 ENCOUNTER — Encounter (HOSPITAL_COMMUNITY): Payer: Self-pay

## 2017-09-13 ENCOUNTER — Encounter (HOSPITAL_COMMUNITY)
Admission: EM | Disposition: A | Discharge status: Home / Self Care | Payer: Self-pay | Source: Home / Self Care | Attending: General Surgery

## 2017-09-13 HISTORY — PX: CHOLECYSTECTOMY: SHX55

## 2017-09-13 LAB — COMPREHENSIVE METABOLIC PANEL
ALK PHOS: 73 U/L (ref 38–126)
ALT: 42 U/L (ref 14–54)
AST: 33 U/L (ref 15–41)
Albumin: 3.4 g/dL — ABNORMAL LOW (ref 3.5–5.0)
Anion gap: 11 (ref 5–15)
BUN: 8 mg/dL (ref 6–20)
CALCIUM: 8.8 mg/dL — AB (ref 8.9–10.3)
CO2: 21 mmol/L — ABNORMAL LOW (ref 22–32)
CREATININE: 0.62 mg/dL (ref 0.44–1.00)
Chloride: 105 mmol/L (ref 101–111)
Glucose, Bld: 108 mg/dL — ABNORMAL HIGH (ref 65–99)
Potassium: 4.2 mmol/L (ref 3.5–5.1)
Sodium: 137 mmol/L (ref 135–145)
Total Bilirubin: 0.5 mg/dL (ref 0.3–1.2)
Total Protein: 7.1 g/dL (ref 6.5–8.1)

## 2017-09-13 LAB — LIPASE, BLOOD: LIPASE: 62 U/L — AB (ref 11–51)

## 2017-09-13 SURGERY — LAPAROSCOPIC CHOLECYSTECTOMY
Anesthesia: General | Site: Abdomen

## 2017-09-13 MED ORDER — ONDANSETRON HCL 4 MG/2ML IJ SOLN
INTRAMUSCULAR | Status: DC | PRN
  Administered 2017-09-13: 4 mg via INTRAVENOUS

## 2017-09-13 MED ORDER — DEXAMETHASONE SODIUM PHOSPHATE 4 MG/ML IJ SOLN
INTRAMUSCULAR | Status: DC | PRN
  Administered 2017-09-13: 4 mg via INTRAVENOUS

## 2017-09-13 MED ORDER — SUGAMMADEX SODIUM 200 MG/2ML IV SOLN
INTRAVENOUS | Status: DC | PRN
  Administered 2017-09-13: 128.6 mg via INTRAVENOUS

## 2017-09-13 MED ORDER — PROMETHAZINE HCL 25 MG/ML IJ SOLN: 6.2500 mg | INTRAMUSCULAR | Status: DC

## 2017-09-13 MED ORDER — PROMETHAZINE HCL 25 MG/ML IJ SOLN
6.2500 mg | INTRAMUSCULAR | Status: DC | PRN
  Administered 2017-09-13: 6.25 mg via INTRAVENOUS

## 2017-09-13 MED ORDER — SODIUM CHLORIDE 0.9% FLUSH
INTRAVENOUS | Status: AC
  Filled 2017-09-13: qty 20

## 2017-09-13 MED ORDER — PROPOFOL 10 MG/ML IV BOLUS
INTRAVENOUS | Status: AC
  Filled 2017-09-13: qty 20

## 2017-09-13 MED ORDER — SUCCINYLCHOLINE CHLORIDE 20 MG/ML IJ SOLN
INTRAMUSCULAR | Status: DC | PRN
  Administered 2017-09-13: 120 mg via INTRAVENOUS

## 2017-09-13 MED ORDER — LACTATED RINGERS IV SOLN
INTRAVENOUS | Status: DC | PRN
  Administered 2017-09-13: 13:00:00 via INTRAVENOUS

## 2017-09-13 MED ORDER — FENTANYL CITRATE (PF) 100 MCG/2ML IJ SOLN
25.0000 ug | INTRAMUSCULAR | Status: DC | PRN
  Administered 2017-09-13: 50 ug via INTRAVENOUS
  Filled 2017-09-13: qty 2

## 2017-09-13 MED ORDER — DOCUSATE SODIUM 100 MG PO CAPS: 100.0000 mg | ORAL_CAPSULE | Freq: Two times a day (BID) | ORAL | 0 refills | Status: DC

## 2017-09-13 MED ORDER — FENTANYL CITRATE (PF) 100 MCG/2ML IJ SOLN
INTRAMUSCULAR | Status: DC | PRN
  Administered 2017-09-13 (×5): 50 ug via INTRAVENOUS

## 2017-09-13 MED ORDER — MIDAZOLAM HCL 5 MG/5ML IJ SOLN
INTRAMUSCULAR | Status: DC | PRN
  Administered 2017-09-13: 2 mg via INTRAVENOUS

## 2017-09-13 MED ORDER — BUPIVACAINE HCL (PF) 0.5 % IJ SOLN
INTRAMUSCULAR | Status: DC | PRN
  Administered 2017-09-13: 10 mL

## 2017-09-13 MED ORDER — OXYCODONE HCL 5 MG PO TABS: 5.0000 mg | ORAL_TABLET | ORAL | 0 refills | Status: DC | PRN

## 2017-09-13 MED ORDER — BUPIVACAINE HCL (PF) 0.5 % IJ SOLN
INTRAMUSCULAR | Status: AC
  Filled 2017-09-13: qty 30

## 2017-09-13 MED ORDER — ONDANSETRON 4 MG PO TBDP: 4.0000 mg | ORAL_TABLET | Freq: Four times a day (QID) | ORAL | 0 refills | Status: DC | PRN

## 2017-09-13 MED ORDER — PROPOFOL 10 MG/ML IV BOLUS
INTRAVENOUS | Status: DC | PRN
  Administered 2017-09-13: 130 mg via INTRAVENOUS
  Administered 2017-09-13: 20 mg via INTRAVENOUS

## 2017-09-13 MED ORDER — ROCURONIUM BROMIDE 100 MG/10ML IV SOLN
INTRAVENOUS | Status: DC | PRN
  Administered 2017-09-13: 30 mg via INTRAVENOUS

## 2017-09-13 MED ORDER — SUGAMMADEX SODIUM 200 MG/2ML IV SOLN: INTRAVENOUS | Status: DC | PRN

## 2017-09-13 MED ORDER — LIDOCAINE HCL (CARDIAC) PF 100 MG/5ML IV SOSY
PREFILLED_SYRINGE | INTRAVENOUS | Status: DC | PRN
  Administered 2017-09-13: 30 mg via INTRAVENOUS

## 2017-09-13 MED ORDER — ROCURONIUM BROMIDE 50 MG/5ML IV SOLN
INTRAVENOUS | Status: AC
  Filled 2017-09-13: qty 1

## 2017-09-13 MED ORDER — PROMETHAZINE HCL 25 MG/ML IJ SOLN
INTRAMUSCULAR | Status: AC
  Filled 2017-09-13: qty 1

## 2017-09-13 MED ORDER — HEMOSTATIC AGENTS (NO CHARGE) OPTIME
TOPICAL | Status: DC | PRN
  Administered 2017-09-13: 1

## 2017-09-13 MED ORDER — 0.9 % SODIUM CHLORIDE (POUR BTL) OPTIME
TOPICAL | Status: DC | PRN
  Administered 2017-09-13: 1000 mL

## 2017-09-13 MED ORDER — MIDAZOLAM HCL 2 MG/2ML IJ SOLN
INTRAMUSCULAR | Status: AC
  Filled 2017-09-13: qty 2

## 2017-09-13 MED ORDER — LIDOCAINE HCL (PF) 1 % IJ SOLN
INTRAMUSCULAR | Status: AC
  Filled 2017-09-13: qty 5

## 2017-09-13 MED ORDER — FENTANYL CITRATE (PF) 250 MCG/5ML IJ SOLN
INTRAMUSCULAR | Status: AC
  Filled 2017-09-13: qty 5

## 2017-09-13 MED ORDER — KETOROLAC TROMETHAMINE 30 MG/ML IJ SOLN
30.0000 mg | Freq: Once | INTRAMUSCULAR | Status: AC
  Administered 2017-09-13: 30 mg via INTRAVENOUS
  Filled 2017-09-13: qty 1

## 2017-09-13 MED ORDER — CIPROFLOXACIN IN D5W 400 MG/200ML IV SOLN
400.0000 mg | INTRAVENOUS | Status: DC
  Filled 2017-09-13: qty 200

## 2017-09-13 MED ORDER — HYDROCODONE-ACETAMINOPHEN 7.5-325 MG PO TABS: 1.0000 | ORAL_TABLET | Freq: Once | ORAL | Status: DC | PRN

## 2017-09-13 SURGICAL SUPPLY — 43 items
APPLIER CLIP ROT 10 11.4 M/L (STAPLE) ×2
BAG RETRIEVAL 10 (BASKET) ×1
BLADE SURG 15 STRL LF DISP TIS (BLADE) ×1 IMPLANT
BLADE SURG 15 STRL SS (BLADE) ×1
CHLORAPREP W/TINT 26ML (MISCELLANEOUS) ×2 IMPLANT
CLIP APPLIE ROT 10 11.4 M/L (STAPLE) ×1 IMPLANT
CLOTH BEACON ORANGE TIMEOUT ST (SAFETY) ×2 IMPLANT
COVER LIGHT HANDLE STERIS (MISCELLANEOUS) ×4 IMPLANT
DECANTER SPIKE VIAL GLASS SM (MISCELLANEOUS) ×2 IMPLANT
DERMABOND ADVANCED (GAUZE/BANDAGES/DRESSINGS) ×1
DERMABOND ADVANCED .7 DNX12 (GAUZE/BANDAGES/DRESSINGS) ×1 IMPLANT
ELECT REM PT RETURN 9FT ADLT (ELECTROSURGICAL) ×2
ELECTRODE REM PT RTRN 9FT ADLT (ELECTROSURGICAL) ×1 IMPLANT
FILTER SMOKE EVAC LAPAROSHD (FILTER) ×2 IMPLANT
GLOVE BIO SURGEON STRL SZ 6.5 (GLOVE) ×2 IMPLANT
GLOVE BIOGEL PI IND STRL 6.5 (GLOVE) ×1 IMPLANT
GLOVE BIOGEL PI IND STRL 7.0 (GLOVE) ×2 IMPLANT
GLOVE BIOGEL PI INDICATOR 6.5 (GLOVE) ×1
GLOVE BIOGEL PI INDICATOR 7.0 (GLOVE) ×2
GLOVE ECLIPSE 6.5 STRL STRAW (GLOVE) ×4 IMPLANT
GOWN STRL REUS W/TWL LRG LVL3 (GOWN DISPOSABLE) ×6 IMPLANT
HEMOSTAT SNOW SURGICEL 2X4 (HEMOSTASIS) ×2 IMPLANT
INST SET LAPROSCOPIC AP (KITS) ×2 IMPLANT
IV NS IRRIG 3000ML ARTHROMATIC (IV SOLUTION) IMPLANT
KIT TURNOVER KIT A (KITS) ×2 IMPLANT
MANIFOLD NEPTUNE II (INSTRUMENTS) ×2 IMPLANT
NEEDLE INSUFFLATION 14GA 120MM (NEEDLE) ×2 IMPLANT
NS IRRIG 1000ML POUR BTL (IV SOLUTION) ×2 IMPLANT
PACK LAP CHOLE LZT030E (CUSTOM PROCEDURE TRAY) ×2 IMPLANT
PAD ARMBOARD 7.5X6 YLW CONV (MISCELLANEOUS) ×2 IMPLANT
SET BASIN LINEN APH (SET/KITS/TRAYS/PACK) ×2 IMPLANT
SET TUBE IRRIG SUCTION NO TIP (IRRIGATION / IRRIGATOR) IMPLANT
SLEEVE ENDOPATH XCEL 5M (ENDOMECHANICALS) ×2 IMPLANT
SUT MNCRL AB 4-0 PS2 18 (SUTURE) ×2 IMPLANT
SUT VICRYL 0 UR6 27IN ABS (SUTURE) ×2 IMPLANT
SYS BAG RETRIEVAL 10MM (BASKET) ×1
SYSTEM BAG RETRIEVAL 10MM (BASKET) ×1 IMPLANT
TROCAR ENDO BLADELESS 11MM (ENDOMECHANICALS) ×2 IMPLANT
TROCAR XCEL NON-BLD 5MMX100MML (ENDOMECHANICALS) ×2 IMPLANT
TROCAR XCEL UNIV SLVE 11M 100M (ENDOMECHANICALS) ×2 IMPLANT
TUBE CONNECTING 12X1/4 (SUCTIONS) ×2 IMPLANT
TUBING INSUFFLATION (TUBING) ×2 IMPLANT
WARMER LAPAROSCOPE (MISCELLANEOUS) ×2 IMPLANT

## 2017-09-13 NOTE — Anesthesia Preprocedure Evaluation (Signed)
Anesthesia Evaluation  Patient identified by MRN, date of birth, ID band Patient awake  General Assessment Comment:States Brother had HR stop with ear tubes remotely  Surgery was finished without incident   Reviewed: Allergy & Precautions, NPO status , Patient's Chart, lab work & pertinent test results  History of Anesthesia Complications (+) Family history of anesthesia reaction  Airway Mallampati: II  TM Distance: >3 FB Neck ROM: Full    Dental no notable dental hx.    Pulmonary neg pulmonary ROS, COPD,  COPD inhaler, Current Smoker,  States COPD -smoking  States last albuterol was 2 years ago    Pulmonary exam normal breath sounds clear to auscultation       Cardiovascular Exercise Tolerance: Good hypertension, Pt. on medications negative cardio ROS Normal cardiovascular examI Rhythm:Regular Rate:Normal     Neuro/Psych  Headaches, Anxiety negative neurological ROS  negative psych ROS   GI/Hepatic negative GI ROS, Neg liver ROS, GERD  Medicated and Controlled,  Endo/Other  negative endocrine ROS  Renal/GU negative Renal ROS  negative genitourinary   Musculoskeletal negative musculoskeletal ROS (+) Arthritis , Osteoarthritis,    Abdominal   Peds negative pediatric ROS (+)  Hematology negative hematology ROS (+)   Anesthesia Other Findings   Reproductive/Obstetrics negative OB ROS                             Anesthesia Physical Anesthesia Plan  ASA: II  Anesthesia Plan: General   Post-op Pain Management:    Induction: Intravenous  PONV Risk Score and Plan:   Airway Management Planned: Oral ETT  Additional Equipment:   Intra-op Plan:   Post-operative Plan: Extubation in OR  Informed Consent: I have reviewed the patients History and Physical, chart, labs and discussed the procedure including the risks, benefits and alternatives for the proposed anesthesia with the  patient or authorized representative who has indicated his/her understanding and acceptance.     Plan Discussed with: CRNA  Anesthesia Plan Comments:         Anesthesia Quick Evaluation

## 2017-09-13 NOTE — Progress Notes (Signed)
Patient discharged home.  IV removed - WNL.  Reviewed AVS and medications.  Advised when to call MD.  To call for follow up apt tomorrow morning.  Patient verbalizes understanding.  No questions at this time.  Assisted off unit in NAD

## 2017-09-13 NOTE — Interval H&P Note (Signed)
History and Physical Interval Note:  09/13/2017 12:00 PM  [name]  has presented today for surgery, with the diagnosis of cholecystitis  The various methods of treatment have been discussed with the patient and family. After consideration of risks, benefits and other options for treatment, the patient has consented to  Procedure(s): LAPAROSCOPIC CHOLECYSTECTOMY (N/A) as a surgical intervention .  The patient's history has been reviewed, patient examined, no change in status, stable for surgery.  I have reviewed the patient's chart and labs.  Questions were answered to the patient's satisfaction.    No additional questions. Seen upstairs this AM.   Lucretia RoersLindsay C Bridges

## 2017-09-13 NOTE — Discharge Instructions (Signed)
Discharge Instructions: °Shower per your regular routine. °Take tylenol and ibuprofen as needed for pain control, alternating every 4-6 hours.  °Take Roxicodone for breakthrough pain. °Take colace for constipation related to narcotic pain medication. °Do not pick at the dermabond glue on your incision sites.  ° ° °Laparoscopic Cholecystectomy, Care After °This sheet gives you information about how to care for yourself after your procedure. Your doctor may also give you more specific instructions. If you have problems or questions, contact your doctor. °Follow these instructions at home: °Care for cuts from surgery (incisions) ° °· Follow instructions from your doctor about how to take care of your cuts from surgery. Make sure you: °? Wash your hands with soap and water before you change your bandage (dressing). If you cannot use soap and water, use hand sanitizer. °? Change your bandage as told by your doctor. °? Leave stitches (sutures), skin glue, or skin tape (adhesive) strips in place. They may need to stay in place for 2 weeks or longer. If tape strips get loose and curl up, you may trim the loose edges. Do not remove tape strips completely unless your doctor says it is okay. °· Do not take baths, swim, or use a hot tub until your doctor says it is okay. °· Check your surgical cut area every day for signs of infection. Check for: °? More redness, swelling, or pain. °? More fluid or blood. °? Warmth. °? Pus or a bad smell. °Activity °· Do not drive or use heavy machinery while taking prescription pain medicine. °· Do not lift anything that is heavier than 10 lb (4.5 kg) until your doctor says it is okay. °· Do not play contact sports until your doctor says it is okay. °· Do not drive for 24 hours if you were given a medicine to help you relax (sedative). °· Rest as needed. Do not return to work or school until your doctor says it is okay. °General instructions °· Take over-the-counter and prescription medicines  only as told by your doctor. °· To prevent or treat constipation while you are taking prescription pain medicine, your doctor may recommend that you: °? Drink enough fluid to keep your pee (urine) clear or pale yellow. °? Take over-the-counter or prescription medicines. °? Eat foods that are high in fiber, such as fresh fruits and vegetables, whole grains, and beans. °? Limit foods that are high in fat and processed sugars, such as fried and sweet foods. °Contact a doctor if: °· You develop a rash. °· You have more redness, swelling, or pain around your surgical cuts. °· You have more fluid or blood coming from your surgical cuts. °· Your surgical cuts feel warm to the touch. °· You have pus or a bad smell coming from your surgical cuts. °· You have a fever. °· One or more of your surgical cuts breaks open. °Get help right away if: °· You have trouble breathing. °· You have chest pain. °· You have pain that is getting worse in your shoulders. °· You faint or feel dizzy when you stand. °· You have very bad pain in your belly (abdomen). °· You are sick to your stomach (nauseous) for more than one day. °· You have throwing up (vomiting) that lasts for more than one day. °· You have leg pain. °This information is not intended to replace advice given to you by your health care provider. Make sure you discuss any questions you have with your health care provider. °Document Released:   12/22/2007 Document Revised: 10/03/2015 Document Reviewed: 08/31/2015 °Elsevier Interactive Patient Education © 2018 [employer] ° °

## 2017-09-13 NOTE — Discharge Summary (Signed)
Physician Discharge Summary  Patient ID: Martine Bleecker MRN: [id number] DOB/AGE: [birth year] 57 y.o.  Admit date: 09/11/2017 Discharge date: 09/13/2017  Admission Diagnoses:  Discharge Diagnoses:  Active Problems:   Cholecystitis   Discharged Condition: good  Hospital Course: Ms. Hulgan is a 57 yo who was admitted to the hospital with complaints of RUQ pain and imaging concerning for cholecystitis. She had intractable pain and did have some what of an enlarged CBD but her T bili remained normal.  She also had lipase that was elevated and had come down prior to going back to surgery.  She was taken to the OR for laparoscopic cholecystectomy, and did well post operatively. She had adequate pain control, was eating, and was able to ambulate without issues.   Consults: None  Significant Diagnostic Studies: [place] with early cholecystitis, cholelithiasis, CBD without stone   Treatments: Surgery: Laparoscopic cholecystectomy 09/13/17; IV antibiotics   Discharge Exam: Blood pressure (!) 146/90, pulse 78, temperature 97.8 F (36.6 C), resp. rate 20, height 5' (1.524 m), weight 141 lb 12.1 oz (64.3 kg), SpO2 94 %. General appearance: alert, cooperative and no distress Resp: normal work breathing GI: soft, appropriately tender, port sites with dermabond, no signs of infection  Disposition: Discharge disposition: 01-Home or Self Care       Discharge Instructions    Call MD for:  difficulty breathing, headache or visual disturbances   Complete by:  As directed    Call MD for:  persistant dizziness or light-headedness   Complete by:  As directed    Call MD for:  persistant nausea and vomiting   Complete by:  As directed    Call MD for:  redness, tenderness, or signs of infection (pain, swelling, redness, odor or green/yellow discharge around incision site)   Complete by:  As directed    Call MD for:  severe uncontrolled pain   Complete by:  As directed    Call MD for:  temperature  >100.4   Complete by:  As directed    Diet - low sodium heart healthy   Complete by:  As directed    Increase activity slowly   Complete by:  As directed      Allergies as of 09/13/2017      Reactions   Penicillins Swelling   Throat swelling Has patient had a PCN reaction causing immediate rash, facial/tongue/throat swelling, SOB or lightheadedness with hypotension: Yes Has patient had a PCN reaction causing severe rash involving mucus membranes or skin necrosis: No Has patient had a PCN reaction that required hospitalization  ED visit but no hospitalization Has patient had a PCN reaction occurring within the last 10 years: No If all of the above answers are "NO", then may proceed with Cephalosporin use.      Medication List    TAKE these medications   amLODipine 5 MG tablet Commonly known as:  NORVASC Take 1 tablet (5 mg total) by mouth 2 (two) times daily.   aspirin EC 81 MG tablet Take 81 mg by mouth daily.   docusate sodium 100 MG capsule Commonly known as:  COLACE Take 1 capsule (100 mg total) by mouth 2 (two) times daily.   hydrochlorothiazide 25 MG tablet Commonly known as:  HYDRODIURIL Take 1 tablet (25 mg total) by mouth daily.   lisinopril 20 MG tablet Commonly known as:  PRINIVIL,ZESTRIL Take 1 tablet (20 mg total) by mouth daily.   ondansetron 4 MG disintegrating tablet Commonly known as:  ZOFRAN-ODT Take 1 tablet (  4 mg total) by mouth every 6 (six) hours as needed for nausea.   oxyCODONE 5 MG immediate release tablet Commonly known as:  Oxy IR/ROXICODONE Take 1-2 tablets (5-10 mg total) by mouth every 4 (four) hours as needed for severe pain or breakthrough pain.   ranitidine 150 MG tablet Commonly known as:  ZANTAC Take 150 mg by mouth daily.   vitamin B-12 1000 MCG tablet Commonly known as:  CYANOCOBALAMIN Take 1,000 mcg by mouth daily.      Follow-up Information    Lucretia RoersBridges, Lindsay C, MD In 3 weeks.   Specialty:  General Surgery Contact  information: [address] [place] [address] [phone]           Signed: Lucretia RoersLindsay C Bridges 09/13/2017, 5:51 PM

## 2017-09-13 NOTE — Anesthesia Procedure Notes (Addendum)
Procedure Name: Intubation Date/Time: 09/13/2017 1:17 PM Performed by: Andree Elk, Georgie Eduardo A, CRNA Pre-anesthesia Checklist: Patient identified, Timeout performed, Emergency Drugs available, Suction available and Patient being monitored Patient Re-evaluated:Patient Re-evaluated prior to induction Oxygen Delivery Method: Circle system utilized Preoxygenation: Pre-oxygenation with 100% oxygen Induction Type: IV induction, Cricoid Pressure applied and Rapid sequence Laryngoscope Size: Mac and 3 Grade View: Grade II Number of attempts: 1 Placement Confirmation: ETT inserted through vocal cords under direct vision and positive ETCO2 Secured at: 21 cm Tube secured with: Tape Dental Injury: Teeth and Oropharynx as per pre-operative assessment

## 2017-09-13 NOTE — Transfer of Care (Signed)
Immediate Anesthesia Transfer of Care Note  Patient: [name]  Procedure(s) Performed: LAPAROSCOPIC CHOLECYSTECTOMY (N/A Abdomen)  Patient Location: PACU  Anesthesia Type:General  Level of Consciousness: drowsy and patient cooperative  Airway & Oxygen Therapy: Patient Spontanous Breathing and Patient connected to nasal cannula oxygen  Post-op Assessment: Report given to RN and Post -op Vital signs reviewed and stable  Post vital signs: Reviewed and stable  Last Vitals:  Vitals Value Taken Time  BP 162/87 09/13/2017  2:22 PM  Temp    Pulse 78 09/13/2017  2:25 PM  Resp 22 09/13/2017  2:25 PM  SpO2 85 % 09/13/2017  2:25 PM  Vitals shown include unvalidated device data.  Last Pain:  Vitals:   09/13/17 1156  TempSrc: Oral  PainSc: 5       Patients Stated Pain Goal: 4 (09/13/17 1156)  Complications: No apparent anesthesia complications

## 2017-09-13 NOTE — Anesthesia Postprocedure Evaluation (Signed)
Anesthesia Post Note Late Entry for 1545  Patient: [name]  Procedure(s) Performed: LAPAROSCOPIC CHOLECYSTECTOMY (N/A Abdomen)  Patient location during evaluation: PACU Anesthesia Type: General Level of consciousness: awake and alert and patient cooperative Pain management: pain level controlled Vital Signs Assessment: post-procedure vital signs reviewed and stable Respiratory status: spontaneous breathing Cardiovascular status: stable Postop Assessment: no apparent nausea or vomiting Anesthetic complications: no     Last Vitals:  Vitals:   09/13/17 1523 09/13/17 1535  BP: (!) 151/77 (!) 146/90  Pulse: 66 78  Resp: (!) 21 20  Temp:  36.6 C  SpO2: 93% 94%    Last Pain:  Vitals:   09/13/17 1523  TempSrc:   PainSc: Asleep                 ADAMS, AMY A

## 2017-09-13 NOTE — Op Note (Signed)
Operative Note   Preoperative Diagnosis: Acute Cholecystitis    Postoperative Diagnosis: Same   Procedure(s) Performed: Laparoscopic cholecystectomy   Surgeon: Lillia AbedLindsay C. Henreitta LeberBridges, MD   Assistants: No qualified resident was available   Anesthesia: General endotracheal   Anesthesiologist: Dorena CookeyNabonsal, Jeff S, MD    Specimens: Gallbladder    Estimated Blood Loss: Minimal    Blood Replacement: None    Complications: None    Operative Findings: Distended gallbladder with bile   Procedure: The patient was taken to the operating room and placed supine. General endotracheal anesthesia was induced. Intravenous antibiotics were administered per protocol. An orogastric tube positioned to decompress the stomach. The abdomen was prepared and draped in the usual sterile fashion.    A supraumbilical incision was made and a Veress technique was utilized to achieve pneumoperitoneum to 15 mmHg with carbon dioxide. A 11 mm optiview port was placed through the supraumbilical region, and a 10 mm 0-degree operative laparoscope was introduced. The area underlying the trocar and Veress needle were inspected and without evidence of injury.  Remaining trocars were placed under direct vision. Two 5 mm ports were placed in the right abdomen, between the anterior axillary and midclavicular line.  A final 11 mm port was placed through the mid-epigastrium, near the falciform ligament.    The gallbladder fundus was elevated cephalad and the infundibulum was retracted to the patient's right. The gallbladder/cystic duct junction was skeletonized. The cystic artery noted in the triangle of Calot and was also skeletonized.  We then continued liberal medial and lateral dissection until the critical view of safety was achieved.    The cystic duct and cystic artery were doubly clipped and divided. The gallbladder was then dissected from the liver bed with electrocautery. There was a posterior branch of the artery that was  clipped and divided. The specimen was placed in an Endopouch and was retrieved through the epigastric site. There was spillage of bile.    Final inspection revealed acceptable hemostasis. Surgical Jamelle HaringSnow was placed in the gallbladder bed. 0 Vicryl fascial sutures were used to close the epigastric and umbilical port sites. Trocars were removed and pneumoperitoneum was released. Skin incisions were closed with 4-0 Monocryl subcuticular sutures and Dermabond. The patient was awakened from anesthesia and extubated without complication.    Algis GreenhouseLindsay Deajah Erkkila, MD [facility] [address] [place], [place] [address] [phone] (office)

## 2017-09-14 ENCOUNTER — Encounter (HOSPITAL_COMMUNITY): Payer: Self-pay | Admitting: General Surgery

## 2017-09-18 ENCOUNTER — Other Ambulatory Visit: Payer: Self-pay | Admitting: General Surgery

## 2017-09-18 MED ORDER — OXYCODONE HCL 5 MG PO TABS: 5.0000 mg | ORAL_TABLET | ORAL | 0 refills | Status: DC | PRN

## 2017-09-26 ENCOUNTER — Encounter: Payer: Self-pay | Admitting: General Surgery

## 2017-09-26 ENCOUNTER — Ambulatory Visit (INDEPENDENT_AMBULATORY_CARE_PROVIDER_SITE_OTHER): Payer: Self-pay | Admitting: General Surgery

## 2017-09-26 VITALS — BP 162/93 | HR 84 | Temp 98.0°F | Resp 18 | Wt 136.0 lb

## 2017-09-26 DIAGNOSIS — K819 Cholecystitis, unspecified: Secondary | ICD-10-CM

## 2017-09-26 NOTE — Patient Instructions (Signed)
Activity as tolerated

## 2017-09-26 NOTE — Progress Notes (Signed)
[facility] Note   HPI:  57 y.o. Female presents to clinic for post-op follow-up evaluation after laparoscopic cholecystectomy. Patient reports she is doing great and has no pain, no reflux, no issues.   Review of Systems:  Eating well Having Bms but is chronically constipated at baseline  No pain Some redness at lateral port site improved  All other review of systems: otherwise negative   Pathology: Diagnosis Gallbladder - CHRONIC CHOLECYSTITIS. - ADHERENT PORTION OF LIVER PARENCHYMA WITH NON SPECIFIC REACTIVE CHANGES.  Vital Signs:  BP (!) 162/93 (BP Location: Left Arm, Patient Position: Sitting, Cuff Size: Normal)   Pulse 84   Temp 98 F (36.7 C) (Temporal)   Resp 18   Wt 136 lb (61.7 kg)   BMI 26.56 kg/m    Physical Exam:  Physical Exam  Constitutional: She appears well-developed.  HENT:  Head: Normocephalic.  Cardiovascular: Normal rate.  Pulmonary/Chest: Effort normal.  Abdominal: Soft. She exhibits no distension. There is no tenderness.  Port sites healing, lateral most site with a scab and small area of erythema, no drainage, nontender, otherwise no signs of infection  Vitals reviewed.   Laboratory studies: None   Imaging:  None    Assessment:  57 y.o. yo Female s/p laparoscopic cholecystectomy doing well. Feeling great.   Plan:  - Lateral port site likely had hematoma that got superficially infected and has cleared on its on  Based on her reports of the redness decreasing in size   - Monitor the lateral port  - Activity and diet as tolerated - Miralax for constipation  - Follow up PRN   All of the above recommendations were discussed with the patient, and all of patient's questions were answered to her expressed satisfaction.  Algis GreenhouseLindsay Bridges, MD [facility] [address] [place], [place] [address] [phone] (office)

## 2017-10-02 ENCOUNTER — Other Ambulatory Visit: Payer: Self-pay

## 2017-10-02 ENCOUNTER — Encounter (HOSPITAL_COMMUNITY): Payer: Self-pay

## 2017-10-02 ENCOUNTER — Emergency Department (HOSPITAL_COMMUNITY)
Admission: EM | Admit: 2017-10-02 | Discharge: 2017-10-02 | Disposition: A | Discharge status: Home / Self Care | Payer: Self-pay | Attending: Emergency Medicine | Admitting: Emergency Medicine

## 2017-10-02 DIAGNOSIS — T8130XA Disruption of wound, unspecified, initial encounter: Secondary | ICD-10-CM

## 2017-10-02 DIAGNOSIS — I10 Essential (primary) hypertension: Secondary | ICD-10-CM | POA: Insufficient documentation

## 2017-10-02 DIAGNOSIS — T8131XA Disruption of external operation (surgical) wound, not elsewhere classified, initial encounter: Secondary | ICD-10-CM | POA: Insufficient documentation

## 2017-10-02 DIAGNOSIS — Z760 Encounter for issue of repeat prescription: Secondary | ICD-10-CM | POA: Insufficient documentation

## 2017-10-02 DIAGNOSIS — J449 Chronic obstructive pulmonary disease, unspecified: Secondary | ICD-10-CM | POA: Insufficient documentation

## 2017-10-02 DIAGNOSIS — Z7982 Long term (current) use of aspirin: Secondary | ICD-10-CM | POA: Insufficient documentation

## 2017-10-02 DIAGNOSIS — Y829 Unspecified medical devices associated with adverse incidents: Secondary | ICD-10-CM | POA: Insufficient documentation

## 2017-10-02 DIAGNOSIS — Z79899 Other long term (current) drug therapy: Secondary | ICD-10-CM | POA: Insufficient documentation

## 2017-10-02 DIAGNOSIS — F17210 Nicotine dependence, cigarettes, uncomplicated: Secondary | ICD-10-CM | POA: Insufficient documentation

## 2017-10-02 MED ORDER — AMLODIPINE BESYLATE 5 MG PO TABS: 5.0000 mg | ORAL_TABLET | Freq: Two times a day (BID) | ORAL | 0 refills | Status: DC

## 2017-10-02 MED ORDER — HYDROCHLOROTHIAZIDE 25 MG PO TABS: 25.0000 mg | ORAL_TABLET | Freq: Every day | ORAL | 0 refills | Status: DC

## 2017-10-02 MED ORDER — LISINOPRIL 20 MG PO TABS: 20.0000 mg | ORAL_TABLET | Freq: Every day | ORAL | 0 refills | Status: DC

## 2017-10-02 NOTE — ED Provider Notes (Signed)
[facility] Provider Note   CSN: [id number] Arrival date & time: 10/02/17  1258     History   Chief Complaint Chief Complaint  Patient presents with  . Wound Check    HPI Chanetta MarshallKaren Triska is a 57 y.o. female.  Pt presents to the ED today with a wound check.  She had her gallbladder removed on 6/19 by Dr. Henreitta LeberBridges.  She said the wound on her right side has not healed.  A big scab came off last night.  She is worried the hole is communicating with the inside of her abdomen.  The pt denies f/c.     Past Medical History:  Diagnosis Date  . Anxiety   . Arthritis   . Constipation   . COPD (chronic obstructive pulmonary disease) (HCC)   . Family history of adverse reaction to anesthesia    brother was "allergic" to anesthesia - heart stopped beating on the surgery table  . Gallbladder sludge   . GERD (gastroesophageal reflux disease)   . Headache    used to have migraines  . Hypertension   . Palpitations   . Staph infection     Patient Active Problem List   Diagnosis Date Noted  . Cholecystitis 09/11/2017  . Proximal phalanx fracture of finger 03/31/2015    Past Surgical History:  Procedure Laterality Date  . CHOLECYSTECTOMY N/A 09/13/2017   Procedure: LAPAROSCOPIC CHOLECYSTECTOMY;  Surgeon: Lucretia RoersBridges, Lindsay C, MD;  Location: [facility];  Service: General;  Laterality: N/A;  . GROIN DEBRIDEMENT     due to spider bite,   . OPEN REDUCTION INTERNAL FIXATION (ORIF) PROXIMAL PHALANX Right 04/07/2015   Procedure: OPEN REDUCTION INTERNAL FIXATION (ORIF) PROXIMAL PHALANX;  Surgeon: Cammy CopaScott Gregory Dean, MD;  Location: [facility] OR;  Service: Orthopedics;  Laterality: Right;  . TONSILLECTOMY    . TUBAL LIGATION       OB History    Gravida      Para      Term      Preterm      AB      Living  3     SAB      TAB      Ectopic      Multiple      Live Births               Home Medications    Prior to Admission medications   Medication Sig Start  Date End Date Taking? Authorizing Provider  amLODipine (NORVASC) 5 MG tablet Take 1 tablet (5 mg total) by mouth 2 (two) times daily. 10/02/17   Jacalyn LefevreHaviland, Izreal Kock, MD  aspirin EC 81 MG tablet Take 81 mg by mouth daily.    [provider]  docusate sodium (COLACE) 100 MG capsule Take 1 capsule (100 mg total) by mouth 2 (two) times daily. 09/13/17   Lucretia RoersBridges, Lindsay C, MD  hydrochlorothiazide (HYDRODIURIL) 25 MG tablet Take 1 tablet (25 mg total) by mouth daily. 10/02/17   Jacalyn LefevreHaviland, Taleen Prosser, MD  lisinopril (PRINIVIL,ZESTRIL) 20 MG tablet Take 1 tablet (20 mg total) by mouth daily. 10/02/17   Jacalyn LefevreHaviland, Tychelle Purkey, MD  ranitidine (ZANTAC) 150 MG tablet Take 150 mg by mouth daily.    [provider]  vitamin B-12 (CYANOCOBALAMIN) 1000 MCG tablet Take 1,000 mcg by mouth daily.    [provider]    Family History Family History  Problem Relation Age of Onset  . COPD Mother   . Diabetes Father  Social History Social History   Tobacco Use  . Smoking status: Current Some Day Smoker    Packs/day: 0.25    Types: Cigarettes  . Smokeless tobacco: Never Used  Substance Use Topics  . Alcohol use: Yes    Comment: wine occ  . Drug use: No     Allergies   Penicillins   Review of Systems Review of Systems  Skin: Positive for wound.  All other systems reviewed and are negative.    Physical Exam Updated Vital Signs BP (!) 153/91 (BP Location: Right Arm)   Pulse 82   Temp 98.8 F (37.1 C) (Oral)   Resp 18   Ht 5' (1.524 m)   Wt 59.9 kg (132 lb)   SpO2 95%   BMI 25.78 kg/m   Physical Exam  Constitutional: She is oriented to person, place, and time. She appears well-developed and well-nourished.  HENT:  Head: Normocephalic and atraumatic.  Right Ear: External ear normal.  Left Ear: External ear normal.  Nose: Nose normal.  Mouth/Throat: Oropharynx is clear and moist.  Eyes: Pupils are equal, round, and reactive to light. Conjunctivae and EOM are normal.  Neck:  Normal range of motion. Neck supple.  Cardiovascular: Normal rate, regular rhythm, normal heart sounds and intact distal pulses.  Pulmonary/Chest: Effort normal and breath sounds normal.  Abdominal: Soft. Bowel sounds are normal.  Small (1cm by 0.5 cm)  Musculoskeletal: Normal range of motion.  Neurological: She is alert and oriented to person, place, and time.  Skin: Skin is warm. Capillary refill takes less than 2 seconds.  Psychiatric: She has a normal mood and affect. Her behavior is normal. Judgment and thought content normal.  Nursing note and vitals reviewed.      ED Treatments / Results  Labs (all labs ordered are listed, but only abnormal results are displayed) Labs Reviewed - No data to display  EKG None  Radiology No results found.  Procedures Procedures (including critical care time)  Medications Ordered in ED Medications - No data to display   Initial Impression / Assessment and Plan / ED Course  I have reviewed the triage vital signs and the nursing notes.  Pertinent labs & imaging results that were available during my care of the patient were reviewed by me and considered in my medical decision making (see chart for details).    Pt d/w Dr. Lovell Sheehan who recommends gentle soap and water cleaning.  Dr. Henreitta Leber will be in the office tomorrow and Thursday if needed.  Pt to return if worse.    Pt also requesting a refill for her bp meds which was done.  Final Clinical Impressions(s) / ED Diagnoses   Final diagnoses:  Wound dehiscence  Medication refill    ED Discharge Orders        Ordered    amLODipine (NORVASC) 5 MG tablet  2 times daily     10/02/17 1336    hydrochlorothiazide (HYDRODIURIL) 25 MG tablet  Daily     10/02/17 1336    lisinopril (PRINIVIL,ZESTRIL) 20 MG tablet  Daily     10/02/17 1336       Jacalyn Lefevre, MD 10/02/17 1341

## 2017-10-02 NOTE — ED Triage Notes (Signed)
Pt reports had gall bladder removed and hernia surgery on June 19th.  Reports incision to r side has not closed completely.  Reports has had low grade fever and wound oozing at times.  Bandaid covering area.

## 2017-10-05 ENCOUNTER — Ambulatory Visit (INDEPENDENT_AMBULATORY_CARE_PROVIDER_SITE_OTHER): Payer: Self-pay | Admitting: General Surgery

## 2017-10-05 ENCOUNTER — Encounter: Payer: Self-pay | Admitting: General Surgery

## 2017-10-05 VITALS — BP 150/81 | HR 82 | Temp 98.4°F | Resp 18 | Wt 140.0 lb

## 2017-10-05 DIAGNOSIS — T8130XA Disruption of wound, unspecified, initial encounter: Secondary | ICD-10-CM

## 2017-10-05 DIAGNOSIS — K819 Cholecystitis, unspecified: Secondary | ICD-10-CM

## 2017-10-05 NOTE — Progress Notes (Signed)
[facility] Note   HPI:  57 y.o. Female presents to clinic for post-op follow-up evaluation after her lateral right incision site opened up. She was seen in the ED and was sent back to my clinic. She otherwise is fair.   Review of Systems:  No fevers or chills Some left sided abdominal pain  All other review of systems: otherwise negative   Vital Signs:  BP (!) 150/81 (BP Location: Left Arm, Patient Position: Sitting, Cuff Size: Normal)   Pulse 82   Temp 98.4 F (36.9 C) (Temporal)   Resp 18   Wt 140 lb (63.5 kg)   BMI 27.34 kg/m    Physical Exam:  Physical Exam  Constitutional: She appears well-developed.  HENT:  Head: Normocephalic.  Cardiovascular: Normal rate.  Pulmonary/Chest: Effort normal.  Abdominal: Soft. She exhibits no distension. There is no tenderness.  Port sites healed except for right lateral site with 1cm deep, 1cm wide dehiscence, no erythema or drainage  Vitals reviewed.   Laboratory studies:None  Imaging:  None    Assessment:  57 y.o. yo Female with a small wound dehiscence of the port site. Doing fair.   Plan:  - Dry dressing over the area, will heal in next 2-4 weeks   - Activity as tolerated  - PRN Follow up   All of the above recommendations were discussed with the patient and patient's family, and all of patient's and family's questions were answered to their expressed satisfaction.  Algis GreenhouseLindsay Pellegrino Kennard, MD [facility] [address] [place], [place] [id number] [phone] (office)

## 2017-10-05 NOTE — Patient Instructions (Signed)
Activity as tolerated. Gas x and nexium (proton pump inhibitor) for abdominal pain.  Dry bandage over area daily. Shower per routine.

## 2017-12-22 ENCOUNTER — Other Ambulatory Visit: Payer: Self-pay

## 2017-12-22 ENCOUNTER — Encounter (HOSPITAL_COMMUNITY): Payer: Self-pay | Admitting: Emergency Medicine

## 2017-12-22 ENCOUNTER — Emergency Department (HOSPITAL_COMMUNITY): Payer: Self-pay

## 2017-12-22 ENCOUNTER — Emergency Department (HOSPITAL_COMMUNITY)
Admission: EM | Admit: 2017-12-22 | Discharge: 2017-12-22 | Disposition: A | Discharge status: Home / Self Care | Payer: Self-pay | Attending: Emergency Medicine | Admitting: Emergency Medicine

## 2017-12-22 DIAGNOSIS — Y33XXXA Other specified events, undetermined intent, initial encounter: Secondary | ICD-10-CM | POA: Insufficient documentation

## 2017-12-22 DIAGNOSIS — J449 Chronic obstructive pulmonary disease, unspecified: Secondary | ICD-10-CM | POA: Insufficient documentation

## 2017-12-22 DIAGNOSIS — I10 Essential (primary) hypertension: Secondary | ICD-10-CM | POA: Insufficient documentation

## 2017-12-22 DIAGNOSIS — M7552 Bursitis of left shoulder: Secondary | ICD-10-CM | POA: Insufficient documentation

## 2017-12-22 DIAGNOSIS — Y998 Other external cause status: Secondary | ICD-10-CM | POA: Insufficient documentation

## 2017-12-22 DIAGNOSIS — Y9389 Activity, other specified: Secondary | ICD-10-CM | POA: Insufficient documentation

## 2017-12-22 DIAGNOSIS — F17210 Nicotine dependence, cigarettes, uncomplicated: Secondary | ICD-10-CM | POA: Insufficient documentation

## 2017-12-22 DIAGNOSIS — Z79899 Other long term (current) drug therapy: Secondary | ICD-10-CM | POA: Insufficient documentation

## 2017-12-22 DIAGNOSIS — Y9289 Other specified places as the place of occurrence of the external cause: Secondary | ICD-10-CM | POA: Insufficient documentation

## 2017-12-22 DIAGNOSIS — Z7982 Long term (current) use of aspirin: Secondary | ICD-10-CM | POA: Insufficient documentation

## 2017-12-22 DIAGNOSIS — Z760 Encounter for issue of repeat prescription: Secondary | ICD-10-CM | POA: Insufficient documentation

## 2017-12-22 MED ORDER — TRAMADOL HCL 50 MG PO TABS: 50.0000 mg | ORAL_TABLET | Freq: Four times a day (QID) | ORAL | 0 refills | Status: DC | PRN

## 2017-12-22 MED ORDER — HYDROCHLOROTHIAZIDE 25 MG PO TABS: 25.0000 mg | ORAL_TABLET | Freq: Every day | ORAL | 0 refills | Status: DC

## 2017-12-22 MED ORDER — LIDOCAINE 5 % EX PTCH
1.0000 | MEDICATED_PATCH | CUTANEOUS | Status: DC
  Administered 2017-12-22: 1 via TRANSDERMAL
  Filled 2017-12-22 (×4): qty 1

## 2017-12-22 MED ORDER — LIDOCAINE 5 % EX PTCH: 1.0000 | MEDICATED_PATCH | CUTANEOUS | 0 refills | Status: DC

## 2017-12-22 MED ORDER — AMLODIPINE BESYLATE 5 MG PO TABS: 5.0000 mg | ORAL_TABLET | Freq: Two times a day (BID) | ORAL | 0 refills | Status: DC

## 2017-12-22 MED ORDER — LISINOPRIL 20 MG PO TABS: 20.0000 mg | ORAL_TABLET | Freq: Every day | ORAL | 0 refills | Status: DC

## 2017-12-22 NOTE — ED Notes (Signed)
Pt returned from X-ray.  

## 2017-12-22 NOTE — ED Provider Notes (Signed)
[facility] EMERGENCY DEPARTMENT Provider Note   CSN: [id number] Arrival date & time: 12/22/17  1142     History   Chief Complaint Chief Complaint  Patient presents with  . Shoulder Injury    HPI [name] is a 57 y.o. female.  The history is provided by the patient. No language interpreter was used.  Shoulder Injury      57 year old female presenting complaining of left shoulder pain.  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  She over twisted it and developed acute onset of pain to her L shoulder.  She described pain as a throbbing burning sensation worsening with shoulder movement.  She also noticed a knot to the left anterior shoulder since the injury.  Pain is rated as 10 out of 10, nonradiating without any associated neck pain or numbness.  No complaints of elbow or wrist pain.  No fever or chills.  No chest pain or trouble breathing.  Past Medical History:  Diagnosis Date  . Anxiety   . Arthritis   . Constipation   . COPD (chronic obstructive pulmonary disease) (HCC)   . Family history of adverse reaction to anesthesia    brother was "allergic" to anesthesia - heart stopped beating on the surgery table  . Gallbladder sludge   . GERD (gastroesophageal reflux disease)   . Headache    used to have migraines  . Hypertension   . Palpitations   . Staph infection     Patient Active Problem List   Diagnosis Date Noted  . Cholecystitis 09/11/2017  . Proximal phalanx fracture of finger 03/31/2015    Past Surgical History:  Procedure Laterality Date  . CHOLECYSTECTOMY N/A 09/13/2017   Procedure: LAPAROSCOPIC CHOLECYSTECTOMY;  Surgeon: Lucretia Roers, MD;  Location: [facility];  Service: General;  Laterality: N/A;  . GROIN DEBRIDEMENT     due to spider bite,   . OPEN REDUCTION INTERNAL FIXATION (ORIF) PROXIMAL PHALANX Right 04/07/2015   Procedure: OPEN REDUCTION INTERNAL FIXATION (ORIF) PROXIMAL  PHALANX;  Surgeon: Cammy Copa, MD;  Location: [facility] OR;  Service: Orthopedics;  Laterality: Right;  . TONSILLECTOMY    . TUBAL LIGATION       OB History    Gravida      Para      Term      Preterm      AB      Living  3     SAB      TAB      Ectopic      Multiple      Live Births               Home Medications    Prior to Admission medications   Medication Sig Start Date End Date Taking? Authorizing Provider  amLODipine (NORVASC) 5 MG tablet Take 1 tablet (5 mg total) by mouth 2 (two) times daily. 10/02/17   Jacalyn Lefevre, MD  aspirin EC 81 MG tablet Take 81 mg by mouth daily.    [provider]  docusate sodium (COLACE) 100 MG capsule Take 1 capsule (100 mg total) by mouth 2 (two) times daily. Patient not taking: Reported on 10/05/2017 09/13/17   Lucretia Roers, MD  hydrochlorothiazide (HYDRODIURIL) 25 MG tablet Take 1 tablet (25 mg total) by mouth daily. 10/02/17   Jacalyn Lefevre, MD  lisinopril (PRINIVIL,ZESTRIL) 20 MG tablet Take 1 tablet (20 mg  total) by mouth daily. 10/02/17   Jacalyn Lefevre, MD  ranitidine (ZANTAC) 150 MG tablet Take 150 mg by mouth daily.    [provider]  vitamin B-12 (CYANOCOBALAMIN) 1000 MCG tablet Take 1,000 mcg by mouth daily.    [provider]    Family History Family History  Problem Relation Age of Onset  . COPD Mother   . Diabetes Father     Social History Social History   Tobacco Use  . Smoking status: Current Some Day Smoker    Packs/day: 0.25    Types: Cigarettes  . Smokeless tobacco: Never Used  Substance Use Topics  . Alcohol use: Yes    Comment: wine occ  . Drug use: No     Allergies   Penicillins   Review of Systems Review of Systems  Constitutional: Negative for fever.  Musculoskeletal: Positive for arthralgias.  Skin: Negative for rash and wound.  Neurological: Negative for numbness.     Physical Exam Updated Vital Signs BP (!) 169/92 (BP Location: Right  Arm)   Pulse 84   Temp 98.2 F (36.8 C) (Oral)   Resp 18   Ht 4\' 11"  (1.499 m)   Wt 61.7 kg   SpO2 97%   BMI 27.47 kg/m   Physical Exam  Constitutional: She is oriented to person, place, and time. She appears well-developed and well-nourished. No distress.  HENT:  Head: Atraumatic.  Eyes: Conjunctivae are normal.  Neck: Neck supple.  No midline spine tenderness.  Cardiovascular: Normal rate and regular rhythm.  Pulmonary/Chest: Effort normal and breath sounds normal.  Musculoskeletal: She exhibits tenderness (Left shoulder: Tenderness to anterior shoulder with a mobile nodule approximately 4 cm in diameter that is tender to palpation.  Decreased shoulder flexion rotation and abduction secondary to pain.  No obvious deformity noted.).  Neurological: She is alert and oriented to person, place, and time.  Skin: No rash noted.  Psychiatric: She has a normal mood and affect.  Nursing note and vitals reviewed.    ED Treatments / Results  Labs (all labs ordered are listed, but only abnormal results are displayed) Labs Reviewed - No data to display  EKG None  Radiology Dg Shoulder Left  Result Date: 12/22/2017 CLINICAL DATA:  Left shoulder pain for 1 week, initial encounter EXAM: LEFT SHOULDER - 2+ VIEW COMPARISON:  None. FINDINGS: Mild degenerative changes of the acromioclavicular joint are seen. No acute fracture or dislocation is noted. The underlying bony thorax is within normal limits. IMPRESSION: No acute abnormality noted. Electronically Signed   By: Alcide Clever M.D.   On: 12/22/2017 12:38    Procedures Procedures (including critical care time)  Medications Ordered in ED Medications  lidocaine (LIDODERM) 5 % 1 patch (has no administration in time range)     Initial Impression / Assessment and Plan / ED Course  I have reviewed the triage vital signs and the nursing notes.  Pertinent labs & imaging results that were available during my care of the patient were  reviewed by me and considered in my medical decision making (see chart for details).     BP (!) 169/92 (BP Location: Right Arm)   Pulse 84   Temp 98.2 F (36.8 C) (Oral)   Resp 18   Ht 4\' 11"  (1.499 m)   Wt 61.7 kg   SpO2 97%   BMI 27.47 kg/m    Final Clinical Impressions(s) / ED Diagnoses   Final diagnoses:  Subacromial bursitis of left shoulder joint  Medication refill    ED Discharge Orders         Ordered    lidocaine (LIDODERM) 5 %  Every 24 hours     12/22/17 1248    traMADol (ULTRAM) 50 MG tablet  Every 6 hours PRN     12/22/17 1248    amLODipine (NORVASC) 5 MG tablet  2 times daily     12/22/17 1252    hydrochlorothiazide (HYDRODIURIL) 25 MG tablet  Daily     12/22/17 1252    lisinopril (PRINIVIL,ZESTRIL) 20 MG tablet  Daily     12/22/17 1252         12:34 PM Pt here with L shoulder pain from overexertion "twisting" a week ago.  She has limited ROM with both passive and active movement, suspicion of adhesive capsulitis.  She has a soft mobile nodule to the anterior L shoulder that felt similar to a lipoma. It could be bursitis.    12:45 PM Left shoulder x-ray unremarkable.  Patient will be treated for subacromial bursitis with Lidoderm patch, tramadol as needed, and sling provided for support.  Orthopedic referral given as needed.  Patient also request for refill of her blood pressure medication.   Fayrene Helper, PA-C 12/22/17 1253    Donnetta Hutching, MD 12/23/17 [phone]

## 2017-12-22 NOTE — ED Triage Notes (Signed)
Lt shoulder pain x 1 week after trying to open a bottle of wine with [employer].  Painful with movement

## 2017-12-22 NOTE — Discharge Instructions (Signed)
Please apply lidoderm patch to the affected area on your left shoulder as needed for pain.  Take tramadol for pain.  Wear sling as needed for support.

## 2018-02-23 NOTE — Congregational Nurse Program (Signed)
[facility] [employer] [facility] B?P 164/98 P 77. Reviewed importance of taking medicationas prescribed,  Getting the proper exercis and eating a heart healthy diet. Jenene SlickerEmma Sharmel Ballantine RN, [facility], [phone]

## 2018-06-15 ENCOUNTER — Encounter (HOSPITAL_COMMUNITY): Payer: Self-pay

## 2018-06-15 ENCOUNTER — Emergency Department (HOSPITAL_COMMUNITY): Payer: Self-pay

## 2018-06-15 ENCOUNTER — Other Ambulatory Visit: Payer: Self-pay

## 2018-06-15 ENCOUNTER — Emergency Department (HOSPITAL_COMMUNITY)
Admission: EM | Admit: 2018-06-15 | Discharge: 2018-06-15 | Disposition: A | Discharge status: Home / Self Care | Payer: Self-pay | Attending: Emergency Medicine | Admitting: Emergency Medicine

## 2018-06-15 DIAGNOSIS — J449 Chronic obstructive pulmonary disease, unspecified: Secondary | ICD-10-CM | POA: Insufficient documentation

## 2018-06-15 DIAGNOSIS — Z7982 Long term (current) use of aspirin: Secondary | ICD-10-CM | POA: Insufficient documentation

## 2018-06-15 DIAGNOSIS — F17210 Nicotine dependence, cigarettes, uncomplicated: Secondary | ICD-10-CM | POA: Insufficient documentation

## 2018-06-15 DIAGNOSIS — J209 Acute bronchitis, unspecified: Secondary | ICD-10-CM | POA: Insufficient documentation

## 2018-06-15 DIAGNOSIS — Z79899 Other long term (current) drug therapy: Secondary | ICD-10-CM | POA: Insufficient documentation

## 2018-06-15 DIAGNOSIS — I10 Essential (primary) hypertension: Secondary | ICD-10-CM | POA: Insufficient documentation

## 2018-06-15 MED ORDER — IPRATROPIUM-ALBUTEROL 0.5-2.5 (3) MG/3ML IN SOLN: 3.0000 mL | Freq: Once | RESPIRATORY_TRACT | Status: DC

## 2018-06-15 MED ORDER — ALBUTEROL SULFATE (2.5 MG/3ML) 0.083% IN NEBU: 2.5000 mg | INHALATION_SOLUTION | Freq: Four times a day (QID) | RESPIRATORY_TRACT | 12 refills | Status: DC | PRN

## 2018-06-15 MED ORDER — DOXYCYCLINE HYCLATE 100 MG PO CAPS: 100.0000 mg | ORAL_CAPSULE | Freq: Two times a day (BID) | ORAL | 0 refills | Status: DC

## 2018-06-15 MED ORDER — ALBUTEROL SULFATE HFA 108 (90 BASE) MCG/ACT IN AERS
2.0000 | INHALATION_SPRAY | Freq: Once | RESPIRATORY_TRACT | Status: AC
  Administered 2018-06-15: 2 via RESPIRATORY_TRACT
  Filled 2018-06-15: qty 6.7

## 2018-06-15 NOTE — Discharge Instructions (Signed)
Return if any problems.

## 2018-06-15 NOTE — ED Triage Notes (Addendum)
Pt c/o of cough and runny nose. States she got it from someone at school. School would not let her come back because they wanted her tested for COVID19. Pt informed that only admitted patients are tested. Pt reports fever off and on, around 101 degrees yesterday. Pt reports intermittent periods of SOB.

## 2018-06-15 NOTE — ED Provider Notes (Signed)
[facility] EMERGENCY DEPARTMENT Provider Note   CSN: [id number] Arrival date & time: 06/15/18  1154    History   Chief Complaint Chief Complaint  Patient presents with  . Cough    HPI [name] is a 58 y.o. female.     The history is provided by the patient. No language interpreter was used.  Cough  Cough characteristics:  Non-productive Sputum characteristics:  Nondescript Severity:  Moderate Onset quality:  Gradual Timing:  Constant Progression:  Worsening Chronicity:  New Smoker: no   Context: upper respiratory infection   Relieved by:  Nothing Worsened by:  Nothing Ineffective treatments:  None tried Risk factors: no recent infection   Pt complains of cough and congestion.  Pt denies any fever.   Past Medical History:  Diagnosis Date  . Anxiety   . Arthritis   . Constipation   . COPD (chronic obstructive pulmonary disease) (HCC)   . Family history of adverse reaction to anesthesia    brother was "allergic" to anesthesia - heart stopped beating on the surgery table  . Gallbladder sludge   . GERD (gastroesophageal reflux disease)   . Headache    used to have migraines  . Hypertension   . Palpitations   . Staph infection     Patient Active Problem List   Diagnosis Date Noted  . Cholecystitis 09/11/2017  . Proximal phalanx fracture of finger 03/31/2015    Past Surgical History:  Procedure Laterality Date  . CHOLECYSTECTOMY N/A 09/13/2017   Procedure: LAPAROSCOPIC CHOLECYSTECTOMY;  Surgeon: Lucretia Roers, MD;  Location: [facility];  Service: General;  Laterality: N/A;  . GROIN DEBRIDEMENT     due to spider bite,   . OPEN REDUCTION INTERNAL FIXATION (ORIF) PROXIMAL PHALANX Right 04/07/2015   Procedure: OPEN REDUCTION INTERNAL FIXATION (ORIF) PROXIMAL PHALANX;  Surgeon: Cammy Copa, MD;  Location: [facility] OR;  Service: Orthopedics;  Laterality: Right;  . TONSILLECTOMY    . TUBAL LIGATION       OB History    Gravida      Para      Term       Preterm      AB      Living  3     SAB      TAB      Ectopic      Multiple      Live Births               Home Medications    Prior to Admission medications   Medication Sig Start Date End Date Taking? Authorizing Provider  albuterol (PROVENTIL) (2.5 MG/3ML) 0.083% nebulizer solution Take 3 mLs (2.5 mg total) by nebulization every 6 (six) hours as needed for wheezing or shortness of breath. 06/15/18   Elson Areas, PA-C  amLODipine (NORVASC) 5 MG tablet Take 1 tablet (5 mg total) by mouth 2 (two) times daily. 12/22/17   Fayrene Helper, PA-C  aspirin EC 81 MG tablet Take 81 mg by mouth daily.    [provider]  docusate sodium (COLACE) 100 MG capsule Take 1 capsule (100 mg total) by mouth 2 (two) times daily. Patient not taking: Reported on 10/05/2017 09/13/17   Lucretia Roers, MD  doxycycline (VIBRAMYCIN) 100 MG capsule Take 1 capsule (100 mg total) by mouth 2 (two) times daily. 06/15/18   Elson Areas, PA-C  hydrochlorothiazide (HYDRODIURIL) 25 MG tablet Take 1 tablet (25 mg total) by mouth daily. 12/22/17   Fayrene Helper,  PA-C  lidocaine (LIDODERM) 5 % Place 1 patch onto the skin daily. Remove & Discard patch within 12 hours or as directed by MD 12/22/17   Fayrene Helper, PA-C  lisinopril (PRINIVIL,ZESTRIL) 20 MG tablet Take 1 tablet (20 mg total) by mouth daily. 12/22/17   Fayrene Helper, PA-C  ranitidine (ZANTAC) 150 MG tablet Take 150 mg by mouth daily.    [provider]  traMADol (ULTRAM) 50 MG tablet Take 1 tablet (50 mg total) by mouth every 6 (six) hours as needed for moderate pain or severe pain. 12/22/17   Fayrene Helper, PA-C  vitamin B-12 (CYANOCOBALAMIN) 1000 MCG tablet Take 1,000 mcg by mouth daily.    [provider]    Family History Family History  Problem Relation Age of Onset  . COPD Mother   . Diabetes Father     Social History Social History   Tobacco Use  . Smoking status: Current Some Day Smoker    Packs/day: 0.25     Types: Cigarettes  . Smokeless tobacco: Never Used  Substance Use Topics  . Alcohol use: Yes    Comment: wine occ  . Drug use: No     Allergies   Penicillins   Review of Systems Review of Systems  Respiratory: Positive for cough.   All other systems reviewed and are negative.    Physical Exam Updated Vital Signs BP (!) 153/77 (BP Location: Right Arm)   Pulse 76   Temp 98 F (36.7 C) (Oral)   Resp 18   Ht 5' (1.524 m)   Wt 64 kg   SpO2 97%   BMI 27.54 kg/m   Physical Exam Vitals signs and nursing note reviewed.  Constitutional:      Appearance: She is well-developed.  HENT:     Head: Normocephalic.     Right Ear: Tympanic membrane normal.     Left Ear: Tympanic membrane normal.     Nose: Nose normal.     Mouth/Throat:     Mouth: Mucous membranes are moist.  Eyes:     Pupils: Pupils are equal, round, and reactive to light.  Neck:     Musculoskeletal: Normal range of motion.  Cardiovascular:     Rate and Rhythm: Normal rate.  Pulmonary:     Effort: Pulmonary effort is normal.  Abdominal:     General: Abdomen is flat. There is no distension.  Musculoskeletal: Normal range of motion.  Skin:    General: Skin is warm.  Neurological:     General: No focal deficit present.     Mental Status: She is alert and oriented to person, place, and time.  Psychiatric:        Mood and Affect: Mood normal.      ED Treatments / Results  Labs (all labs ordered are listed, but only abnormal results are displayed) Labs Reviewed - No data to display  EKG None  Radiology Dg Chest 2 View  Result Date: 06/15/2018 CLINICAL DATA:  Cough and runny nose over the last 3 weeks. Intermittent fever. EXAM: CHEST - 2 VIEW COMPARISON:  08/21/2017 FINDINGS: Heart size is normal. There is aortic atherosclerosis. There may be central bronchial thickening but there is no infiltrate, collapse or effusion. No significant bone finding. IMPRESSION: Possible bronchitis.  No consolidation  or collapse. Electronically Signed   By: Paulina Fusi M.D.   On: 06/15/2018 12:38    Procedures Procedures (including critical care time)  Medications Ordered in ED Medications  albuterol (PROVENTIL HFA;VENTOLIN  HFA) 108 (90 Base) MCG/ACT inhaler 2 puff (2 puffs Inhalation Given 06/15/18 1334)     Initial Impression / Assessment and Plan / ED Course  I have reviewed the triage vital signs and the nursing notes.  Pertinent labs & imaging results that were available during my care of the patient were reviewed by me and considered in my medical decision making (see chart for details).        MDM   Chest xray normal.  I suspect viral illness   Final Clinical Impressions(s) / ED Diagnoses   Final diagnoses:  Acute bronchitis, unspecified organism    ED Discharge Orders         Ordered    doxycycline (VIBRAMYCIN) 100 MG capsule  2 times daily     06/15/18 1318    albuterol (PROVENTIL) (2.5 MG/3ML) 0.083% nebulizer solution  Every 6 hours PRN     06/15/18 1318           Elson Areas, [place] 06/15/18 1642    Samuel Jester, DO 06/17/18 1326

## 2018-09-28 ENCOUNTER — Observation Stay (HOSPITAL_COMMUNITY)
Admission: EM | Admit: 2018-09-28 | Discharge: 2018-09-29 | Disposition: A | Discharge status: Home / Self Care | Payer: Self-pay | Attending: Internal Medicine | Admitting: Internal Medicine

## 2018-09-28 ENCOUNTER — Other Ambulatory Visit: Payer: Self-pay

## 2018-09-28 ENCOUNTER — Emergency Department (HOSPITAL_COMMUNITY): Payer: Self-pay

## 2018-09-28 ENCOUNTER — Encounter (HOSPITAL_COMMUNITY): Payer: Self-pay

## 2018-09-28 DIAGNOSIS — R739 Hyperglycemia, unspecified: Secondary | ICD-10-CM | POA: Diagnosis present

## 2018-09-28 DIAGNOSIS — K1120 Sialoadenitis, unspecified: Principal | ICD-10-CM | POA: Insufficient documentation

## 2018-09-28 DIAGNOSIS — Z1159 Encounter for screening for other viral diseases: Secondary | ICD-10-CM | POA: Insufficient documentation

## 2018-09-28 DIAGNOSIS — F17210 Nicotine dependence, cigarettes, uncomplicated: Secondary | ICD-10-CM | POA: Insufficient documentation

## 2018-09-28 DIAGNOSIS — I700 Atherosclerosis of aorta: Secondary | ICD-10-CM | POA: Insufficient documentation

## 2018-09-28 DIAGNOSIS — F419 Anxiety disorder, unspecified: Secondary | ICD-10-CM | POA: Insufficient documentation

## 2018-09-28 DIAGNOSIS — I10 Essential (primary) hypertension: Secondary | ICD-10-CM | POA: Diagnosis present

## 2018-09-28 DIAGNOSIS — Z720 Tobacco use: Secondary | ICD-10-CM | POA: Diagnosis present

## 2018-09-28 DIAGNOSIS — Z7982 Long term (current) use of aspirin: Secondary | ICD-10-CM | POA: Insufficient documentation

## 2018-09-28 DIAGNOSIS — R609 Edema, unspecified: Secondary | ICD-10-CM | POA: Diagnosis present

## 2018-09-28 DIAGNOSIS — Z79899 Other long term (current) drug therapy: Secondary | ICD-10-CM | POA: Insufficient documentation

## 2018-09-28 DIAGNOSIS — J449 Chronic obstructive pulmonary disease, unspecified: Secondary | ICD-10-CM | POA: Insufficient documentation

## 2018-09-28 DIAGNOSIS — M1990 Unspecified osteoarthritis, unspecified site: Secondary | ICD-10-CM | POA: Insufficient documentation

## 2018-09-28 DIAGNOSIS — K219 Gastro-esophageal reflux disease without esophagitis: Secondary | ICD-10-CM | POA: Insufficient documentation

## 2018-09-28 DIAGNOSIS — E876 Hypokalemia: Secondary | ICD-10-CM | POA: Insufficient documentation

## 2018-09-28 LAB — HEPATIC FUNCTION PANEL
ALT: 43 U/L (ref 0–44)
AST: 40 U/L (ref 15–41)
Albumin: 3.5 g/dL (ref 3.5–5.0)
Alkaline Phosphatase: 75 U/L (ref 38–126)
Bilirubin, Direct: 0.1 mg/dL (ref 0.0–0.2)
Indirect Bilirubin: 0.4 mg/dL (ref 0.3–0.9)
Total Bilirubin: 0.5 mg/dL (ref 0.3–1.2)
Total Protein: 7.4 g/dL (ref 6.5–8.1)

## 2018-09-28 LAB — RESPIRATORY PANEL BY PCR

## 2018-09-28 LAB — CBC
HCT: 44.4 % (ref 36.0–46.0)
Hemoglobin: 14.4 g/dL (ref 12.0–15.0)
MCH: 30.1 pg (ref 26.0–34.0)
MCHC: 32.4 g/dL (ref 30.0–36.0)
MCV: 92.9 fL (ref 80.0–100.0)
Platelets: 227 10*3/uL (ref 150–400)
RBC: 4.78 MIL/uL (ref 3.87–5.11)
RDW: 13.2 % (ref 11.5–15.5)
WBC: 10.9 10*3/uL — ABNORMAL HIGH (ref 4.0–10.5)
nRBC: 0 % (ref 0.0–0.2)

## 2018-09-28 LAB — BASIC METABOLIC PANEL
Anion gap: 12 (ref 5–15)
BUN: 19 mg/dL (ref 6–20)
CO2: 22 mmol/L (ref 22–32)
Calcium: 8.7 mg/dL — ABNORMAL LOW (ref 8.9–10.3)
Chloride: 107 mmol/L (ref 98–111)
Creatinine, Ser: 0.78 mg/dL (ref 0.44–1.00)
GFR calc Af Amer: >60 mL/min (ref >60)
GFR calc non Af Amer: >60 mL/min (ref >60)
Glucose, Bld: 150 mg/dL — ABNORMAL HIGH (ref 70–99)
Potassium: 3.4 mmol/L — ABNORMAL LOW (ref 3.5–5.1)
Sodium: 141 mmol/L (ref 135–145)

## 2018-09-28 LAB — MRSA PCR SCREENING: MRSA by PCR: NEGATIVE

## 2018-09-28 LAB — I-STAT CREATININE, ED: Creatinine, Ser: 0.7 mg/dL (ref 0.44–1.00)

## 2018-09-28 LAB — HEMOGLOBIN A1C
Hgb A1c MFr Bld: 5.5 % (ref 4.8–5.6)
Mean Plasma Glucose: 111.15 mg/dL

## 2018-09-28 LAB — T4, FREE: Free T4: 0.91 ng/dL (ref 0.61–1.12)

## 2018-09-28 LAB — SARS CORONAVIRUS 2 BY RT PCR (HOSPITAL ORDER, PERFORMED IN ~~LOC~~ HOSPITAL LAB): SARS Coronavirus 2: NEGATIVE

## 2018-09-28 LAB — TSH: TSH: 3.372 u[IU]/mL (ref 0.350–4.500)

## 2018-09-28 LAB — HIV ANTIBODY (ROUTINE TESTING W REFLEX): HIV Screen 4th Generation wRfx: NONREACTIVE

## 2018-09-28 MED ORDER — METHYLPREDNISOLONE SODIUM SUCC 125 MG IJ SOLR
INTRAMUSCULAR | Status: AC
  Administered 2018-09-28: 125 mg via INTRAVENOUS
  Filled 2018-09-28: qty 2

## 2018-09-28 MED ORDER — DIPHENHYDRAMINE HCL 50 MG/ML IJ SOLN
INTRAMUSCULAR | Status: AC
  Administered 2018-09-28: 50 mg via INTRAVENOUS
  Filled 2018-09-28: qty 1

## 2018-09-28 MED ORDER — ONDANSETRON HCL 4 MG PO TABS: 4.0000 mg | ORAL_TABLET | Freq: Four times a day (QID) | ORAL | Status: DC | PRN

## 2018-09-28 MED ORDER — SODIUM CHLORIDE 0.9 % IV SOLN
INTRAVENOUS | Status: DC
  Administered 2018-09-28: 12:00:00 via INTRAVENOUS

## 2018-09-28 MED ORDER — ONDANSETRON HCL 4 MG/2ML IJ SOLN
4.0000 mg | Freq: Four times a day (QID) | INTRAMUSCULAR | Status: DC | PRN
  Administered 2018-09-28: 4 mg via INTRAVENOUS
  Filled 2018-09-28: qty 2

## 2018-09-28 MED ORDER — DIPHENHYDRAMINE HCL 50 MG/ML IJ SOLN
50.0000 mg | Freq: Once | INTRAMUSCULAR | Status: AC
  Administered 2018-09-28: 50 mg via INTRAVENOUS

## 2018-09-28 MED ORDER — ASPIRIN EC 81 MG PO TBEC
81.0000 mg | DELAYED_RELEASE_TABLET | Freq: Every day | ORAL | Status: DC
  Administered 2018-09-28 – 2018-09-29 (×2): 81 mg via ORAL
  Filled 2018-09-28 (×2): qty 1

## 2018-09-28 MED ORDER — ENOXAPARIN SODIUM 40 MG/0.4ML ~~LOC~~ SOLN
40.0000 mg | SUBCUTANEOUS | Status: DC
  Administered 2018-09-29: 40 mg via SUBCUTANEOUS
  Filled 2018-09-28: qty 0.4

## 2018-09-28 MED ORDER — HYDROCHLOROTHIAZIDE 25 MG PO TABS
25.0000 mg | ORAL_TABLET | Freq: Every day | ORAL | Status: DC
  Administered 2018-09-28 – 2018-09-29 (×2): 25 mg via ORAL
  Filled 2018-09-28 (×2): qty 1

## 2018-09-28 MED ORDER — FAMOTIDINE IN NACL 20-0.9 MG/50ML-% IV SOLN
INTRAVENOUS | Status: AC
  Administered 2018-09-28: 20 mg via INTRAVENOUS
  Filled 2018-09-28: qty 50

## 2018-09-28 MED ORDER — FAMOTIDINE IN NACL 20-0.9 MG/50ML-% IV SOLN
20.0000 mg | Freq: Once | INTRAVENOUS | Status: AC
  Administered 2018-09-28: 20 mg via INTRAVENOUS

## 2018-09-28 MED ORDER — ACETAMINOPHEN 325 MG PO TABS
650.0000 mg | ORAL_TABLET | Freq: Four times a day (QID) | ORAL | Status: DC | PRN
  Administered 2018-09-28: 650 mg via ORAL
  Filled 2018-09-28: qty 2

## 2018-09-28 MED ORDER — IOHEXOL 300 MG/ML  SOLN
75.0000 mL | Freq: Once | INTRAMUSCULAR | Status: AC | PRN
  Administered 2018-09-28: 75 mL via INTRAVENOUS

## 2018-09-28 MED ORDER — TRAMADOL HCL 50 MG PO TABS: 50.0000 mg | ORAL_TABLET | Freq: Four times a day (QID) | ORAL | Status: DC | PRN

## 2018-09-28 MED ORDER — AMLODIPINE BESYLATE 5 MG PO TABS
5.0000 mg | ORAL_TABLET | Freq: Two times a day (BID) | ORAL | Status: DC
  Administered 2018-09-28 – 2018-09-29 (×3): 5 mg via ORAL
  Filled 2018-09-28 (×3): qty 1

## 2018-09-28 MED ORDER — ALBUTEROL SULFATE (2.5 MG/3ML) 0.083% IN NEBU: 2.5000 mg | INHALATION_SOLUTION | Freq: Four times a day (QID) | RESPIRATORY_TRACT | Status: DC | PRN

## 2018-09-28 MED ORDER — NICOTINE 14 MG/24HR TD PT24
14.0000 mg | MEDICATED_PATCH | Freq: Every day | TRANSDERMAL | Status: DC
  Administered 2018-09-28 – 2018-09-29 (×2): 14 mg via TRANSDERMAL
  Filled 2018-09-28 (×2): qty 1

## 2018-09-28 MED ORDER — METHYLPREDNISOLONE SODIUM SUCC 125 MG IJ SOLR
125.0000 mg | Freq: Once | INTRAMUSCULAR | Status: AC
  Administered 2018-09-28: 125 mg via INTRAVENOUS

## 2018-09-28 MED ORDER — ENALAPRILAT 1.25 MG/ML IV SOLN
0.6250 mg | Freq: Four times a day (QID) | INTRAVENOUS | Status: DC | PRN
  Filled 2018-09-28: qty 0.5

## 2018-09-28 MED ORDER — CLINDAMYCIN PHOSPHATE 600 MG/50ML IV SOLN
600.0000 mg | Freq: Four times a day (QID) | INTRAVENOUS | Status: DC
  Administered 2018-09-28 – 2018-09-29 (×4): 600 mg via INTRAVENOUS
  Filled 2018-09-28 (×4): qty 50

## 2018-09-28 MED ORDER — EPINEPHRINE 0.3 MG/0.3ML IJ SOAJ
0.3000 mg | Freq: Once | INTRAMUSCULAR | Status: AC
  Administered 2018-09-28: 0.3 mg via INTRAMUSCULAR
  Filled 2018-09-28: qty 0.3

## 2018-09-28 MED ORDER — SODIUM CHLORIDE 0.9 % IV BOLUS
500.0000 mL | Freq: Once | INTRAVENOUS | Status: AC
  Administered 2018-09-28: 500 mL via INTRAVENOUS

## 2018-09-28 MED ORDER — CLINDAMYCIN PHOSPHATE 600 MG/50ML IV SOLN
600.0000 mg | Freq: Once | INTRAVENOUS | Status: AC
  Administered 2018-09-28: 600 mg via INTRAVENOUS
  Filled 2018-09-28: qty 50

## 2018-09-28 MED ORDER — METHYLPREDNISOLONE SODIUM SUCC 125 MG IJ SOLR: 80.0000 mg | Freq: Three times a day (TID) | INTRAMUSCULAR | Status: DC

## 2018-09-28 MED ORDER — PANTOPRAZOLE SODIUM 40 MG PO TBEC
40.0000 mg | DELAYED_RELEASE_TABLET | Freq: Every day | ORAL | Status: DC
  Administered 2018-09-28 – 2018-09-29 (×2): 40 mg via ORAL
  Filled 2018-09-28 (×2): qty 1

## 2018-09-28 MED ORDER — VITAMIN B-12 1000 MCG PO TABS
1000.0000 ug | ORAL_TABLET | Freq: Every day | ORAL | Status: DC
  Administered 2018-09-28 – 2018-09-29 (×2): 1000 ug via ORAL
  Filled 2018-09-28 (×2): qty 1

## 2018-09-28 NOTE — ED Notes (Signed)
Called [facility] for transport as requested by Ronnette Hila.

## 2018-09-28 NOTE — H&P (Addendum)
TRH H&P    Patient Demographics:    [name], is a 58 y.o. female  MRN: [id number]  DOB - 02/28/1961  Admit Date - 09/28/2018  Referring MD/NP/PA: Mathis Fare  Outpatient Primary MD for the patient is Patient, No Pcp Per  Patient coming from:  home  Chief complaint- swelling of submandibular glands   HPI:    [name]  is a 58 y.o. female, w hypertension, gerd, Copd apparently c/o waking up from sleep w neck swelling. Slight dyspnea.  Pt notes rash on arms about 4 weeks ago.  Sent pictures to [facility] clinic, told might be lupus ?   + dry eyes, and dry mouth.  Pt denies fever, chills, cp, palp, n/v, abd pain, diarrhea, brbpr, dysuria, pt denies any change in medications.  No odd food eaten.     In ED,  T 97.6, P 100 R 25  Bp 162/140  Pox 100%  Wt 68 kg  CT soft tissue neck IMPRESSION: Inflammation centered in the bilateral submandibular compartments surrounding enlarged submandibular glands, probably acute submandibular sialadenitis. No abscess or sialolith identified.  Wbc 10.9  Hgb 14.4 Plt 227 Na 141, K3.4  Bun 19, Creatinine 0.78  Pt received solumedrol 125mg  iv x1, clindamycin 300mg  iv x1, and epi in ED   Pt will be admitted for submandibular gland/neck swelling     Review of systems:    In addition to the HPI above,  No Fever-chills, No Headache, No changes with Vision or hearing, No problems swallowing food or Liquids, No Chest pain, No Cough  No Abdominal pain, No Nausea or Vomiting, bowel movements are regular, No Blood in stool or Urine, No dysuria,  No new joints pains-aches,  No new weakness, tingling, numbness in any extremity, No recent weight gain or loss, No polyuria, polydypsia or polyphagia, No significant Mental Stressors.  All other systems reviewed and are negative.    Past History of the following :    Past Medical History:  Diagnosis  Date  . Anxiety   . Arthritis   . Constipation   . COPD (chronic obstructive pulmonary disease) ([place])   . Family history of adverse reaction to anesthesia    brother was "allergic" to anesthesia - heart stopped beating on the surgery table  . Gallbladder sludge   . GERD (gastroesophageal reflux disease)   . Headache    used to have migraines  . Hypertension   . Palpitations   . Staph infection       Past Surgical History:  Procedure Laterality Date  . CHOLECYSTECTOMY N/A 09/13/2017   Procedure: LAPAROSCOPIC CHOLECYSTECTOMY;  Surgeon: Virl Cagey, MD;  Location: [facility];  Service: General;  Laterality: N/A;  . GROIN DEBRIDEMENT     due to spider bite,   . OPEN REDUCTION INTERNAL FIXATION (ORIF) PROXIMAL PHALANX Right 04/07/2015   Procedure: OPEN REDUCTION INTERNAL FIXATION (ORIF) PROXIMAL PHALANX;  Surgeon: Meredith Pel, MD;  Location: [place];  Service: Orthopedics;  Laterality: Right;  . TONSILLECTOMY    . TUBAL LIGATION  Social History:      Social History   Tobacco Use  . Smoking status: Current Some Day Smoker    Packs/day: 0.25    Types: Cigarettes  . Smokeless tobacco: Never Used  Substance Use Topics  . Alcohol use: Yes    Comment: wine occ       Family History :     Family History  Problem Relation Age of Onset  . COPD Mother   . Diabetes Father        Home Medications:   Prior to Admission medications   Medication Sig Start Date End Date Taking? Authorizing Provider  albuterol (PROVENTIL) (2.5 MG/3ML) 0.083% nebulizer solution Take 3 mLs (2.5 mg total) by nebulization every 6 (six) hours as needed for wheezing or shortness of breath. 06/15/18   Elson AreasSofia, Leslie K, PA-C  amLODipine (NORVASC) 5 MG tablet Take 1 tablet (5 mg total) by mouth 2 (two) times daily. 12/22/17   Fayrene Helperran, Bowie, PA-C  aspirin EC 81 MG tablet Take 81 mg by mouth daily.    [provider]  docusate sodium (COLACE) 100 MG capsule Take 1 capsule (100 mg total)  by mouth 2 (two) times daily. Patient not taking: Reported on 10/05/2017 09/13/17   Lucretia RoersBridges, Lindsay C, MD  doxycycline (VIBRAMYCIN) 100 MG capsule Take 1 capsule (100 mg total) by mouth 2 (two) times daily. 06/15/18   Elson AreasSofia, Leslie K, PA-C  hydrochlorothiazide (HYDRODIURIL) 25 MG tablet Take 1 tablet (25 mg total) by mouth daily. 12/22/17   Fayrene Helperran, Bowie, PA-C  lidocaine (LIDODERM) 5 % Place 1 patch onto the skin daily. Remove & Discard patch within 12 hours or as directed by MD 12/22/17   Fayrene Helperran, Bowie, PA-C  lisinopril (PRINIVIL,ZESTRIL) 20 MG tablet Take 1 tablet (20 mg total) by mouth daily. 12/22/17   Fayrene Helperran, Bowie, PA-C  ranitidine (ZANTAC) 150 MG tablet Take 150 mg by mouth daily.    [provider]  traMADol (ULTRAM) 50 MG tablet Take 1 tablet (50 mg total) by mouth every 6 (six) hours as needed for moderate pain or severe pain. 12/22/17   Fayrene Helperran, Bowie, PA-C  vitamin B-12 (CYANOCOBALAMIN) 1000 MCG tablet Take 1,000 mcg by mouth daily.    [provider]     Allergies:     Allergies  Allergen Reactions  . Penicillins Swelling    Throat swelling Has patient had a PCN reaction causing immediate rash, facial/tongue/throat swelling, SOB or lightheadedness with hypotension: Yes Has patient had a PCN reaction causing severe rash involving mucus membranes or skin necrosis: No Has patient had a PCN reaction that required hospitalization  ED visit but no hospitalization Has patient had a PCN reaction occurring within the last 10 years: No If all of the above answers are "NO", then may proceed with Cephalosporin use.     Physical Exam:   Vitals  Blood pressure (!) 162/140, pulse 100, temperature 97.6 F (36.4 C), resp. rate (!) 25, height 5' (1.524 m), weight 68 kg, SpO2 100 %.  1.  General: axox3  2. Psychiatric: euthymic  3. Neurologic: cn2-12 intact, reflexes 2+ symmetric, diffuse with no clonus, motor 5/5 in all 4 ext  4. HEENMT:  Anicteric, pupils 1.705mm symmetric,  direct, consensual intact Neck: + swelling submandibular glands bilaterally, no stridor  5. Respiratory : CTAB  6. Cardiovascular : rrr s1, s2,   7. Gastrointestinal:  Abd: soft, nt, nd, +bs  8. Skin:  Ext: no c/c/e  9.Musculoskeletal:  Good ROM  Data Review:    CBC Recent Labs  Lab 09/28/18 0441  WBC 10.9*  HGB 14.4  HCT 44.4  PLT 227  MCV 92.9  MCH 30.1  MCHC 32.4  RDW 13.2   ------------------------------------------------------------------------------------------------------------------  Results for orders placed or performed during the hospital encounter of 09/28/18 (from the past 48 hour(s))  CBC     Status: Abnormal   Collection Time: 09/28/18  4:41 AM  Result Value Ref Range   WBC 10.9 (H) 4.0 - 10.5 K/uL   RBC 4.78 3.87 - 5.11 MIL/uL   Hemoglobin 14.4 12.0 - 15.0 g/dL   HCT 78.444.4 69.636.0 - 29.546.0 %   MCV 92.9 80.0 - 100.0 fL   MCH 30.1 26.0 - 34.0 pg   MCHC 32.4 30.0 - 36.0 g/dL   RDW 28.413.2 13.211.5 - 44.015.5 %   Platelets 227 150 - 400 K/uL   nRBC 0.0 0.0 - 0.2 %    Comment: Performed at [facility], [address] [place], [place] [address]  Basic metabolic panel     Status: Abnormal   Collection Time: 09/28/18  4:41 AM  Result Value Ref Range   Sodium 141 135 - 145 mmol/L   Potassium 3.4 (L) 3.5 - 5.1 mmol/L   Chloride 107 98 - 111 mmol/L   CO2 22 22 - 32 mmol/L   Glucose, Bld 150 (H) 70 - 99 mg/dL   BUN 19 6 - 20 mg/dL   Creatinine, Ser 5.360.78 0.44 - 1.00 mg/dL   Calcium 8.7 (L) 8.9 - 10.3 mg/dL   GFR calc non Af Amer >60 >60 mL/min   GFR calc Af Amer >60 >60 mL/min   Anion gap 12 5 - 15    Comment: Performed at [facility], [address] [place], [place] [address]  TSH     Status: None   Collection Time: 09/28/18  4:41 AM  Result Value Ref Range   TSH 3.372 0.350 - 4.500 uIU/mL    Comment: Performed by a 3rd Generation assay with a functional sensitivity of <=0.01 uIU/mL. Performed at [facility], [address] [place], [place] [address]    I-Stat Creatinine, ED (not at [facility])     Status: None   Collection Time: 09/28/18  4:41 AM  Result Value Ref Range   Creatinine, Ser 0.70 0.44 - 1.00 mg/dL    Chemistries  Recent Labs  Lab 09/28/18 0441  NA 141  K 3.4*  CL 107  CO2 22  GLUCOSE 150*  BUN 19  CREATININE 0.78  0.70  CALCIUM 8.7*   ------------------------------------------------------------------------------------------------------------------  ------------------------------------------------------------------------------------------------------------------ GFR: Estimated Creatinine Clearance: 65.9 mL/min (by C-G formula based on SCr of 0.78 mg/dL). Liver Function Tests: No results for input(s): AST, ALT, ALKPHOS, BILITOT, PROT, ALBUMIN in the last 168 hours. No results for input(s): LIPASE, AMYLASE in the last 168 hours. No results for input(s): AMMONIA in the last 168 hours. Coagulation Profile: No results for input(s): INR, PROTIME in the last 168 hours. Cardiac Enzymes: No results for input(s): CKTOTAL, CKMB, CKMBINDEX, TROPONINI in the last 168 hours. BNP (last 3 results) No results for input(s): PROBNP in the last 8760 hours. HbA1C: No results for input(s): HGBA1C in the last 72 hours. CBG: No results for input(s): GLUCAP in the last 168 hours. Lipid Profile: No results for input(s): CHOL, HDL, LDLCALC, TRIG, CHOLHDL, LDLDIRECT in the last 72 hours. Thyroid Function Tests: Recent Labs    09/28/18 0441  TSH 3.372   Anemia Panel: No results for input(s):  VITAMINB12, FOLATE, FERRITIN, TIBC, IRON, RETICCTPCT in the last 72 hours.  --------------------------------------------------------------------------------------------------------------- Urine analysis:    Component Value Date/Time   COLORURINE YELLOW 09/11/2017 0923   APPEARANCEUR HAZY (A) 09/11/2017 0923   LABSPEC 1.020 09/11/2017 0923   PHURINE 6.5 09/11/2017 0923   GLUCOSEU NEGATIVE 09/11/2017 0923   HGBUR NEGATIVE 09/11/2017 0923    BILIRUBINUR NEGATIVE 09/11/2017 0923   KETONESUR NEGATIVE 09/11/2017 0923   PROTEINUR NEGATIVE 09/11/2017 0923   UROBILINOGEN 0.2 11/29/2013 1916   NITRITE NEGATIVE 09/11/2017 0923   LEUKOCYTESUR TRACE (A) 09/11/2017 0923      Imaging Results:    Ct Soft Tissue Neck W Contrast  Result Date: 09/28/2018 CLINICAL DATA:  58 y/o F; shortness of breath, neck swelling, and redness with onset tonight. EXAM: CT NECK WITH CONTRAST TECHNIQUE: Multidetector CT imaging of the neck was performed using the standard protocol following the bolus administration of intravenous contrast. CONTRAST:  75mL OMNIPAQUE IOHEXOL 300 MG/ML  SOLN COMPARISON:  None. FINDINGS: Pharynx and larynx: Normal. No mass or swelling. Salivary glands: Enlargement and heterogeneous appearance of the bilateral submandibular glands. Inflammation within the submandibular compartments as well as the superficial soft tissues of the upper anterior neck and mild edema within lower masticator compartments and parapharyngeal space. No abscess or sialolith. Normal appearance of the parotid glands. Thyroid: Normal. Lymph nodes: None enlarged or abnormal density. Vascular: Calcific atherosclerosis of the aorta and carotid systems. Retropharyngeal course of right internal carotid artery. Limited intracranial: Negative. Visualized orbits: Negative. Mastoids and visualized paranasal sinuses: Clear. Skeleton: No acute or aggressive process. Upper chest: Centrilobular emphysema. Other: None. IMPRESSION: Inflammation centered in the bilateral submandibular compartments surrounding enlarged submandibular glands, probably acute submandibular sialadenitis. No abscess or sialolith identified. Electronically Signed   By: Mitzi HansenLance  Furusawa-Stratton M.D.   On: 09/28/2018 05:42       Assessment & Plan:    Principal Problem:   Submandibular gland swelling Active Problems:   Hypokalemia  Bilateral submandibular gland swelling Check EBV antibody panel Check mumps  antibody  Check SSA, SSB, ANCA Check Parvovirus  Check Adenovirus Solumedrol 80mg  iv q8h Please consult ENT for evaluation  Hypokalemia Replete Check cmp in am  Hypertension Cont Lisinopril 20mg  po qday Cont hydrochlorothiazide 25mg  po qday Cont Amlodipine 5mg  po qday  Hyperglycemia Check hga1c  Copd Cont Albuterol prn  Tobacco abuse Nicotine patch prn Pt counselled on smoking cessation x 3minutes  H/o Rash Check ANA  Gerd PPI  DVT Prophylaxis-   Lovenox - SCDs   AM Labs Ordered, also please review Full Orders  Family Communication: Admission, patients condition and plan of care including tests being ordered have been discussed with the patient  who indicate understanding and agree with the plan and Code Status.  Code Status:  FULL CODE, left message for husband that [facility]  Admission status: Observationt: Based on patients clinical presentation and evaluation of above clinical data, I have made determination that patient meets observation criteria at this time.   Time spent in minutes : 70   Pearson GrippeJames Margrete Delude M.D on 09/28/2018 at 6:37 AM

## 2018-09-28 NOTE — ED Notes (Signed)
Patient left unit with [facility].  All belongings with patient.

## 2018-09-28 NOTE — ED Notes (Signed)
edp in room  

## 2018-09-28 NOTE — Consult Note (Signed)
Reason for Consult: Bilateral submandibular sialoadenitis Referring Physician: Pearson GrippeJames Kim, MD  HPI:  [name] is an 58 y.o. female who presented to the [facility] emergency room this morning complaining of acute onset neck swelling.  The patient was complaining of slight dyspnea.  Her CT scan showed enlargement of both submandibular glands, consistent with acute sialoadenitis.  The patient denies previous history of salivary gland disease.  The patient was started on clindamycin.  The patient was transferred to [facility] for further evaluation and treatment.  Past Medical History:  Diagnosis Date  . Anxiety   . Arthritis   . Constipation   . COPD (chronic obstructive pulmonary disease) (HCC)   . Family history of adverse reaction to anesthesia    brother was "allergic" to anesthesia - heart stopped beating on the surgery table  . Gallbladder sludge   . GERD (gastroesophageal reflux disease)   . Headache    used to have migraines  . Hypertension   . Palpitations   . Staph infection     Past Surgical History:  Procedure Laterality Date  . CHOLECYSTECTOMY N/A 09/13/2017   Procedure: LAPAROSCOPIC CHOLECYSTECTOMY;  Surgeon: Lucretia RoersBridges, Lindsay C, MD;  Location: [facility] ORS;  Service: General;  Laterality: N/A;  . GROIN DEBRIDEMENT     due to spider bite,   . OPEN REDUCTION INTERNAL FIXATION (ORIF) PROXIMAL PHALANX Right 04/07/2015   Procedure: OPEN REDUCTION INTERNAL FIXATION (ORIF) PROXIMAL PHALANX;  Surgeon: Cammy CopaScott Gregory Dean, MD;  Location: [facility] OR;  Service: Orthopedics;  Laterality: Right;  . TONSILLECTOMY    . TUBAL LIGATION      Family History  Problem Relation Age of Onset  . COPD Mother   . Diabetes Father     Social History:  reports that she has been smoking cigarettes. She has been smoking about 0.25 packs per day. She has never used smokeless tobacco. She reports current alcohol use. She reports that she does not use drugs.  Allergies:  Allergies  Allergen Reactions   . Penicillins Swelling    Throat swelling Has patient had a PCN reaction causing immediate rash, facial/tongue/throat swelling, SOB or lightheadedness with hypotension: Yes Has patient had a PCN reaction causing severe rash involving mucus membranes or skin necrosis: No Has patient had a PCN reaction that required hospitalization  ED visit but no hospitalization Has patient had a PCN reaction occurring within the last 10 years: No If all of the above answers are "NO", then may proceed with Cephalosporin use.    Prior to Admission medications   Medication Sig Start Date End Date Taking? Authorizing Provider  albuterol (PROVENTIL) (2.5 MG/3ML) 0.083% nebulizer solution Take 3 mLs (2.5 mg total) by nebulization every 6 (six) hours as needed for wheezing or shortness of breath. 06/15/18  Yes Cheron SchaumannSofia, Leslie K, PA-C  amLODipine (NORVASC) 5 MG tablet Take 1 tablet (5 mg total) by mouth 2 (two) times daily. 12/22/17  Yes Fayrene Helperran, [place], PA-C  aspirin EC 81 MG tablet Take 81 mg by mouth daily.   Yes [provider]  hydrochlorothiazide (HYDRODIURIL) 25 MG tablet Take 1 tablet (25 mg total) by mouth daily. 12/22/17  Yes Fayrene Helperran, [place], PA-C  lisinopril (PRINIVIL,ZESTRIL) 20 MG tablet Take 1 tablet (20 mg total) by mouth daily. 12/22/17  Yes Fayrene Helperran, [place], PA-C  Melatonin 10 MG TABS Take 10 mg by mouth at bedtime as needed (sleep).    Yes [provider]    Medications:  I have reviewed the patient's current medications. Scheduled: .  amLODipine  5 mg Oral BID  . aspirin EC  81 mg Oral Daily  . enoxaparin (LOVENOX) injection  40 mg Subcutaneous Q24H  . hydrochlorothiazide  25 mg Oral Daily  . nicotine  14 mg Transdermal Daily  . pantoprazole  40 mg Oral Daily  . vitamin B-12  1,000 mcg Oral Daily   Continuous: . sodium chloride 125 mL/hr at 09/28/18 1211  . clindamycin (CLEOCIN) IV      Results for orders placed or performed during the hospital encounter of 09/28/18 (from the past 48  hour(s))  CBC     Status: Abnormal   Collection Time: 09/28/18  4:41 AM  Result Value Ref Range   WBC 10.9 (H) 4.0 - 10.5 K/uL   RBC 4.78 3.87 - 5.11 MIL/uL   Hemoglobin 14.4 12.0 - 15.0 g/dL   HCT 16.144.4 09.636.0 - 04.546.0 %   MCV 92.9 80.0 - 100.0 fL   MCH 30.1 26.0 - 34.0 pg   MCHC 32.4 30.0 - 36.0 g/dL   RDW 40.913.2 81.111.5 - 91.415.5 %   Platelets 227 150 - 400 K/uL   nRBC 0.0 0.0 - 0.2 %    Comment: Performed at [facility], [address] [place], [place] [address]  Basic metabolic panel     Status: Abnormal   Collection Time: 09/28/18  4:41 AM  Result Value Ref Range   Sodium 141 135 - 145 mmol/L   Potassium 3.4 (L) 3.5 - 5.1 mmol/L   Chloride 107 98 - 111 mmol/L   CO2 22 22 - 32 mmol/L   Glucose, Bld 150 (H) 70 - 99 mg/dL   BUN 19 6 - 20 mg/dL   Creatinine, Ser 6.210.78 0.44 - 1.00 mg/dL   Calcium 8.7 (L) 8.9 - 10.3 mg/dL   GFR calc non Af Amer >60 >60 mL/min   GFR calc Af Amer >60 >60 mL/min   Anion gap 12 5 - 15    Comment: Performed at [facility], [address] [place], [place] [address]  TSH     Status: None   Collection Time: 09/28/18  4:41 AM  Result Value Ref Range   TSH 3.372 0.350 - 4.500 uIU/mL    Comment: Performed by a 3rd Generation assay with a functional sensitivity of <=0.01 uIU/mL. Performed at [facility], [address] [place], [place] [address]   T4, free     Status: None   Collection Time: 09/28/18  4:41 AM  Result Value Ref Range   Free T4 0.91 0.61 - 1.12 ng/dL    Comment: (NOTE) Biotin ingestion may interfere with free T4 tests. If the results are inconsistent with the TSH level, previous test results, or the clinical presentation, then consider biotin interference. If needed, order repeat testing after stopping biotin. Performed at [facility], [address] [place], [place] [address]   I-Stat Creatinine, ED (not at [facility])     Status: None   Collection Time: 09/28/18  4:41 AM  Result Value Ref Range   Creatinine, Ser 0.70 0.44 - 1.00 mg/dL   SARS Coronavirus 2 (CEPHEID - Performed in [facility] hospital lab), Hosp Order     Status: None   Collection Time: 09/28/18  5:54 AM   Specimen: Nasopharyngeal Swab  Result Value Ref Range   SARS Coronavirus 2 NEGATIVE NEGATIVE    Comment: (NOTE) If result is NEGATIVE SARS-CoV-2 target nucleic acids are NOT DETECTED. The SARS-CoV-2 RNA is generally detectable in upper and lower  respiratory specimens during the acute phase of infection. The lowest  concentration of SARS-CoV-2 viral copies this assay can detect is 250  copies / mL. A negative result does not preclude SARS-CoV-2 infection  and should not be used as the sole basis for treatment or other  patient management decisions.  A negative result may occur with  improper specimen collection / handling, submission of specimen other  than nasopharyngeal swab, presence of viral mutation(s) within the  areas targeted by this assay, and inadequate number of viral copies  (<250 copies / mL). A negative result must be combined with clinical  observations, patient history, and epidemiological information. If result is POSITIVE SARS-CoV-2 target nucleic acids are DETECTED. The SARS-CoV-2 RNA is generally detectable in upper and lower  respiratory specimens dur ing the acute phase of infection.  Positive  results are indicative of active infection with SARS-CoV-2.  Clinical  correlation with patient history and other diagnostic information is  necessary to determine patient infection status.  Positive results do  not rule out bacterial infection or co-infection with other viruses. If result is PRESUMPTIVE POSTIVE SARS-CoV-2 nucleic acids MAY BE PRESENT.   A presumptive positive result was obtained on the submitted specimen  and confirmed on repeat testing.  While 2019 novel coronavirus  (SARS-CoV-2) nucleic acids may be present in the submitted sample  additional confirmatory testing may be necessary for epidemiological  and / or  clinical management purposes  to differentiate between  SARS-CoV-2 and other Sarbecovirus currently known to infect humans.  If clinically indicated additional testing with an alternate test  methodology [phone]) is advised. The SARS-CoV-2 RNA is generally  detectable in upper and lower respiratory sp ecimens during the acute  phase of infection. The expected result is Negative. Fact Sheet for Patients:  StrictlyIdeas.no Fact Sheet for Healthcare Providers: BankingDealers.co.za This test is not yet approved or cleared by the [place] FDA and has been authorized for detection and/or diagnosis of SARS-CoV-2 by FDA under an Emergency Use Authorization (EUA).  This EUA will remain in effect (meaning this test can be used) for the duration of the COVID-19 declaration under Section 564(b)(1) of the Act, 21 U.S.C. section 360bbb-3(b)(1), unless the authorization is terminated or revoked sooner. Performed at [facility], [address], [place], [place] [address]   Hemoglobin A1c     Status: None   Collection Time: 09/28/18  6:52 AM  Result Value Ref Range   Hgb A1c MFr Bld 5.5 4.8 - 5.6 %    Comment: (NOTE) Pre diabetes:          5.7%-6.4% Diabetes:              >6.4% Glycemic control for   <7.0% adults with diabetes    Mean Plasma Glucose 111.15 mg/dL    Comment: Performed at [facility] [address] [place], [place] [address]  Hepatic function panel     Status: None   Collection Time: 09/28/18 11:07 AM  Result Value Ref Range   Total Protein 7.4 6.5 - 8.1 g/dL   Albumin 3.5 3.5 - 5.0 g/dL   AST 40 15 - 41 U/L   ALT 43 0 - 44 U/L   Alkaline Phosphatase 75 38 - 126 U/L   Total Bilirubin 0.5 0.3 - 1.2 mg/dL   Bilirubin, Direct 0.1 0.0 - 0.2 mg/dL   Indirect Bilirubin 0.4 0.3 - 0.9 mg/dL    Comment: Performed at [place] [address]  [place], [place] [address]    Ct Soft Tissue Neck W Contrast  Result  Date: 09/28/2018 CLINICAL DATA:  58 y/o F; shortness of breath, neck swelling, and redness with onset tonight. EXAM: CT NECK WITH CONTRAST TECHNIQUE: Multidetector CT imaging of the neck was performed using the standard protocol following the bolus administration of intravenous contrast. CONTRAST:  75mL OMNIPAQUE IOHEXOL 300 MG/ML  SOLN COMPARISON:  None. FINDINGS: Pharynx and larynx: Normal. No mass or swelling. Salivary glands: Enlargement and heterogeneous appearance of the bilateral submandibular glands. Inflammation within the submandibular compartments as well as the superficial soft tissues of the upper anterior neck and mild edema within lower masticator compartments and parapharyngeal space. No abscess or sialolith. Normal appearance of the parotid glands. Thyroid: Normal. Lymph nodes: None enlarged or abnormal density. Vascular: Calcific atherosclerosis of the aorta and carotid systems. Retropharyngeal course of right internal carotid artery. Limited intracranial: Negative. Visualized orbits: Negative. Mastoids and visualized paranasal sinuses: Clear. Skeleton: No acute or aggressive process. Upper chest: Centrilobular emphysema. Other: None. IMPRESSION: Inflammation centered in the bilateral submandibular compartments surrounding enlarged submandibular glands, probably acute submandibular sialadenitis. No abscess or sialolith identified. Electronically Signed   By: Mitzi HansenLance  Furusawa-Stratton M.D.   On: 09/28/2018 05:42    Blood pressure (!) 153/73, pulse 71, temperature 97.8 F (36.6 C), temperature source Oral, resp. rate 17, height 5' (1.524 m), weight 68.9 kg, SpO2 98 %. Physical Exam: General: A&Ox3.  No acute distress. Eyes: Her pupils are equal, round, reactive to light. Extraocular motion is intact.  Ears: Examination of the ears shows normal auricles and external auditory canals bilaterally. Both tympanic membranes are intact.  Nose: Nasal examination shows normal mucosa, septum, turbinates.   Face: Facial examination shows no asymmetry. Palpation of the face elicit no significant tenderness.  Mouth: Oral cavity examination shows no mucosal abnormalities. No significant trismus is noted.  Floor of mouth is soft to palpation. Neck: Palpation of the neck reveals mild swelling of bilateral submandibular glands.  No other mass or lesion is noted. Neuro: Cranial nerves 2-12 are all grossly in tact.  Assessment/Plan: Acute bilateral submandibular sialoadenitis.  Recommend IV hydration.  Continue clindamycin. - No other ENT intervention is needed at this time. - The patient may follow-up in my [place] office after discharge.  Keval Nam W Icholas Irby 09/28/2018, 1:57 PM

## 2018-09-28 NOTE — Progress Notes (Signed)
PROGRESS NOTE  [name] KDX:[id number] DOB: 1960-11-04 DOA: 09/28/2018 PCP: Patient, No Pcp Per  Brief History:  Patient seen in follow-up on the same day of admission.  Previously seen earlier today by my colleague Dr. Jani Gravel who admitted the patient and arranged for transfer to [facility] for further evaluation of her sialoadenitis.  58 year old female with a history of hypertension, COPD, anxiety, tobacco abuse presenting with acute onset of bilateral submandibular swelling that she noted when she woke up in the early morning 09/28/2018.  The patient stated that her neck was normal on 09/27/2018.  She states that this has never occurred in the past.  She denies any new medications or over-the-counter medications or illicit drugs.  However, she continues to smoke tobacco.  She denies any new over-the-counter supplements.  The patient denies any recent viral prodromal symptoms, and she denies any sick contacts or recent travels.  She denies any recent radiation or imaging studies requiring intravenous contrast.  She had a transient rash on her bilateral arms and legs about 3 weeks prior to admission which resolved spontaneously.  When she woke up on the morning of 09/28/2018 with the neck swelling she had some difficulty breathing, but she was able to drink fluid and swallow her own saliva without difficulty.  The patient denies any antecedent myalgias, anorexia, fevers, or other viral prodromal type symptoms.  In the emergency department, the patient was afebrile and hemodynamically stable saturating 100% on room air.  CT of the neck soft tissue showed an enlarged and heterogenous appearance of the bilateral submandibular glands with inflammation extending to the superficial soft tissues of the upper neck and mild edema of the lower masticator and parapharyngeal spaces.  There is no abscess or visual stones.  In the ED, the patient was given Pepcid, epinephrine, clindamycin, Benadryl, and  Solu-Medrol IV.  Assessment/Plan: Acute bilateral sialoadenitis -Clinical history consistent with likely viral/inflammatory process -Doubt bacterial infection given history and examination and bilateral nature of the affliction -Doubt anaphylactoid type reaction as the patient has no oral pharyngeal or tongue swelling -Check mumps serologies -viral respiratory panel  -ANA -check LFTs -EBV DNA -currently no airway compromise or difficulty swallowing -SARS-CoV2--neg -Dr. Maudie Mercury has arranged for transfer to [facility] -hold off antibiotics -continue conservative measures  Essential Hypertension -hold lisinopril -continue amlodipine and HCTZ  Tobacco abuse -tobacco cessation discussed  Hyperglycemia -check A1C  Hypokalemia -replete -check mag       Disposition Plan:  Transfer to [facility] Communication:  No Family at bedside  Consultants:  ENT arranged by Dr. Maudie Mercury  Code Status:  FULL  DVT Prophylaxis: [place] Lovenox   Procedures: As Listed in Progress Note Above     Total time spent 35 minutes.  Greater than 50% spent face to face counseling and coordinating care.   Subjective: Pt feeling a little better than last night with regards to her neck.  She states it feels "less swollen".  No difficulty with swallowing saliva or liquids.  Patient denies fevers, chills, headache, chest pain, dyspnea, nausea, vomiting, diarrhea, abdominal pain, dysuria, hematuria, hematochezia, and melena.   Objective: Vitals:   09/28/18 0337 09/28/18 0338 09/28/18 0343  BP: (!) 162/140    Pulse:  100   Resp:  (!) 25   Temp:  97.6 F (36.4 C)   SpO2:  100%   Weight:   68 kg  Height:   5' (1.524 m)   No intake  or output data in the 24 hours ending 09/28/18 0812 Weight change:  Exam:   General:  Pt is alert, follows commands appropriately, not in acute distress  HEENT: bilateral edema of submandibular areas without tenderness.  No pus in mouth at sites of stenson duct or  submandibular ducts.  Cardiovascular: RRR, S1/S2, no rubs, no gallops  Respiratory: CTA bilaterally, no wheezing, no crackles, no rhonchi  Abdomen: Soft/+BS, non tender, non distended, no guarding  Extremities: No edema, No lymphangitis, No petechiae, No rashes, no synovitis   Data Reviewed: I have personally reviewed following labs and imaging studies Basic Metabolic Panel: Recent Labs  Lab 09/28/18 0441  NA 141  K 3.4*  CL 107  CO2 22  GLUCOSE 150*  BUN 19  CREATININE 0.78   0.70  CALCIUM 8.7*   Liver Function Tests: No results for input(s): AST, ALT, ALKPHOS, BILITOT, PROT, ALBUMIN in the last 168 hours. No results for input(s): LIPASE, AMYLASE in the last 168 hours. No results for input(s): AMMONIA in the last 168 hours. Coagulation Profile: No results for input(s): INR, PROTIME in the last 168 hours. CBC: Recent Labs  Lab 09/28/18 0441  WBC 10.9*  HGB 14.4  HCT 44.4  MCV 92.9  PLT 227   Cardiac Enzymes: No results for input(s): CKTOTAL, CKMB, CKMBINDEX, TROPONINI in the last 168 hours. BNP: Invalid input(s): POCBNP CBG: No results for input(s): GLUCAP in the last 168 hours. HbA1C: No results for input(s): HGBA1C in the last 72 hours. Urine analysis:    Component Value Date/Time   COLORURINE YELLOW 09/11/2017 0923   APPEARANCEUR HAZY (A) 09/11/2017 0923   LABSPEC 1.020 09/11/2017 0923   PHURINE 6.5 09/11/2017 0923   GLUCOSEU NEGATIVE 09/11/2017 0923   HGBUR NEGATIVE 09/11/2017 0923   BILIRUBINUR NEGATIVE 09/11/2017 0923   KETONESUR NEGATIVE 09/11/2017 0923   PROTEINUR NEGATIVE 09/11/2017 0923   UROBILINOGEN 0.2 11/29/2013 1916   NITRITE NEGATIVE 09/11/2017 0923   LEUKOCYTESUR TRACE (A) 09/11/2017 0923   Sepsis Labs: @LABRCNTIP (procalcitonin:4,lacticidven:4) ) Recent Results (from the past 240 hour(s))  SARS Coronavirus 2 (CEPHEID - Performed in [facility] hospital lab), Hosp Order     Status: None   Collection Time: 09/28/18  5:54 AM    Specimen: Nasopharyngeal Swab  Result Value Ref Range Status   SARS Coronavirus 2 NEGATIVE NEGATIVE Final    Comment: (NOTE) If result is NEGATIVE SARS-CoV-2 target nucleic acids are NOT DETECTED. The SARS-CoV-2 RNA is generally detectable in upper and lower  respiratory specimens during the acute phase of infection. The lowest  concentration of SARS-CoV-2 viral copies this assay can detect is 250  copies / mL. A negative result does not preclude SARS-CoV-2 infection  and should not be used as the sole basis for treatment or other  patient management decisions.  A negative result may occur with  improper specimen collection / handling, submission of specimen other  than nasopharyngeal swab, presence of viral mutation(s) within the  areas targeted by this assay, and inadequate number of viral copies  (<250 copies / mL). A negative result must be combined with clinical  observations, patient history, and epidemiological information. If result is POSITIVE SARS-CoV-2 target nucleic acids are DETECTED. The SARS-CoV-2 RNA is generally detectable in upper and lower  respiratory specimens dur ing the acute phase of infection.  Positive  results are indicative of active infection with SARS-CoV-2.  Clinical  correlation with patient history and other diagnostic information is  necessary to determine patient infection status.  Positive results do  not rule out bacterial infection or co-infection with other viruses. If result is PRESUMPTIVE POSTIVE SARS-CoV-2 nucleic acids MAY BE PRESENT.   A presumptive positive result was obtained on the submitted specimen  and confirmed on repeat testing.  While 2019 novel coronavirus  (SARS-CoV-2) nucleic acids may be present in the submitted sample  additional confirmatory testing may be necessary for epidemiological  and / or clinical management purposes  to differentiate between  SARS-CoV-2 and other Sarbecovirus currently known to infect humans.  If  clinically indicated additional testing with an alternate test  methodology [phone] is advised. The SARS-CoV-2 RNA is generally  detectable in upper and lower respiratory sp ecimens during the acute  phase of infection. The expected result is Negative. Fact Sheet for Patients:  BoilerBrush.com.cyhttps://www.fda.gov/media/136312/download Fact Sheet for Healthcare Providers: https://pope.com/https://www.fda.gov/media/136313/download This test is not yet approved or cleared by the [place] FDA and has been authorized for detection and/or diagnosis of SARS-CoV-2 by FDA under an Emergency Use Authorization (EUA).  This EUA will remain in effect (meaning this test can be used) for the duration of the COVID-19 declaration under Section 564(b)(1) of the Act, 21 U.S.C. section 360bbb-3(b)(1), unless the authorization is terminated or revoked sooner. Performed at [facility], [address] [place], [place] [address]      Scheduled Meds:  enoxaparin (LOVENOX) injection  40 mg Subcutaneous Q24H   methylPREDNISolone (SOLU-MEDROL) injection  80 mg Intravenous Q8H   nicotine  14 mg Transdermal Daily   Continuous Infusions:  Procedures/Studies: Ct Soft Tissue Neck W Contrast  Result Date: 09/28/2018 CLINICAL DATA:  58 y/o F; shortness of breath, neck swelling, and redness with onset tonight. EXAM: CT NECK WITH CONTRAST TECHNIQUE: Multidetector CT imaging of the neck was performed using the standard protocol following the bolus administration of intravenous contrast. CONTRAST:  75mL OMNIPAQUE IOHEXOL 300 MG/ML  SOLN COMPARISON:  None. FINDINGS: Pharynx and larynx: Normal. No mass or swelling. Salivary glands: Enlargement and heterogeneous appearance of the bilateral submandibular glands. Inflammation within the submandibular compartments as well as the superficial soft tissues of the upper anterior neck and mild edema within lower masticator compartments and parapharyngeal space. No abscess or sialolith. Normal appearance of  the parotid glands. Thyroid: Normal. Lymph nodes: None enlarged or abnormal density. Vascular: Calcific atherosclerosis of the aorta and carotid systems. Retropharyngeal course of right internal carotid artery. Limited intracranial: Negative. Visualized orbits: Negative. Mastoids and visualized paranasal sinuses: Clear. Skeleton: No acute or aggressive process. Upper chest: Centrilobular emphysema. Other: None. IMPRESSION: Inflammation centered in the bilateral submandibular compartments surrounding enlarged submandibular glands, probably acute submandibular sialadenitis. No abscess or sialolith identified. Electronically Signed   By: Mitzi HansenLance  Furusawa-Stratton M.D.   On: 09/28/2018 05:42    Catarina Hartshornavid Frona Yost, DO  [facility] Pager [phone]  If 7PM-7AM, please contact night-coverage [email or web address] Password [email or web address] 09/28/2018, 8:12 AM   LOS: 0 days

## 2018-09-28 NOTE — Progress Notes (Signed)
Pt arrived to unit via [facility] in stable condition. Pt ambulatory and walked from stretcher to bed. Pt AOX4 and verbalized uderstanding of calling with assistance is needed. Call bell within reach. Pt placed on PC monitor. Will continue to monitor.

## 2018-09-28 NOTE — ED Provider Notes (Signed)
[facility] Provider Note   CSN: [id number] Arrival date & time: 09/28/18  [id number]    History   Chief Complaint Chief Complaint  Patient presents with   Shortness of Breath    HPI [name] is a 58 y.o. female.     Patient presents to the emergency department stating that she thinks she is having an allergic reaction.  She reports that she was awakened by her dog just prior to coming to the ER.  When she woke up she realized that she had significant swelling of her neck.  She went to bed feeling fine.  She did not take any new medications or eat any new foods.  She has not had any rash.  She reports that the neck hurts.  She feels short of breath now, thinks she might be having a panic attack.     Past Medical History:  Diagnosis Date   Anxiety    Arthritis    Constipation    COPD (chronic obstructive pulmonary disease) (HCC)    Family history of adverse reaction to anesthesia    brother was "allergic" to anesthesia - heart stopped beating on the surgery table   Gallbladder sludge    GERD (gastroesophageal reflux disease)    Headache    used to have migraines   Hypertension    Palpitations    Staph infection     Patient Active Problem List   Diagnosis Date Noted   Cholecystitis 09/11/2017   Proximal phalanx fracture of finger 03/31/2015    Past Surgical History:  Procedure Laterality Date   CHOLECYSTECTOMY N/A 09/13/2017   Procedure: LAPAROSCOPIC CHOLECYSTECTOMY;  Surgeon: Lucretia RoersBridges, Lindsay C, MD;  Location: [facility];  Service: General;  Laterality: N/A;   GROIN DEBRIDEMENT     due to spider bite,    OPEN REDUCTION INTERNAL FIXATION (ORIF) PROXIMAL PHALANX Right 04/07/2015   Procedure: OPEN REDUCTION INTERNAL FIXATION (ORIF) PROXIMAL PHALANX;  Surgeon: Cammy CopaScott Gregory Dean, MD;  Location: [facility] OR;  Service: Orthopedics;  Laterality: Right;   TONSILLECTOMY     TUBAL LIGATION       OB History    Gravida      Para      Term       Preterm      AB      Living  3     SAB      TAB      Ectopic      Multiple      Live Births               Home Medications    Prior to Admission medications   Medication Sig Start Date End Date Taking? Authorizing Provider  albuterol (PROVENTIL) (2.5 MG/3ML) 0.083% nebulizer solution Take 3 mLs (2.5 mg total) by nebulization every 6 (six) hours as needed for wheezing or shortness of breath. 06/15/18   Elson AreasSofia, Leslie K, PA-C  amLODipine (NORVASC) 5 MG tablet Take 1 tablet (5 mg total) by mouth 2 (two) times daily. 12/22/17   Fayrene Helperran, Bowie, PA-C  aspirin EC 81 MG tablet Take 81 mg by mouth daily.    [provider]  docusate sodium (COLACE) 100 MG capsule Take 1 capsule (100 mg total) by mouth 2 (two) times daily. Patient not taking: Reported on 10/05/2017 09/13/17   Lucretia RoersBridges, Lindsay C, MD  doxycycline (VIBRAMYCIN) 100 MG capsule Take 1 capsule (100 mg total) by mouth 2 (two) times daily. 06/15/18   Elson AreasSofia, Leslie K,  PA-C  hydrochlorothiazide (HYDRODIURIL) 25 MG tablet Take 1 tablet (25 mg total) by mouth daily. 12/22/17   Fayrene Helperran, Bowie, PA-C  lidocaine (LIDODERM) 5 % Place 1 patch onto the skin daily. Remove & Discard patch within 12 hours or as directed by MD 12/22/17   Fayrene Helperran, Bowie, PA-C  lisinopril (PRINIVIL,ZESTRIL) 20 MG tablet Take 1 tablet (20 mg total) by mouth daily. 12/22/17   Fayrene Helperran, Bowie, PA-C  ranitidine (ZANTAC) 150 MG tablet Take 150 mg by mouth daily.    [provider]  traMADol (ULTRAM) 50 MG tablet Take 1 tablet (50 mg total) by mouth every 6 (six) hours as needed for moderate pain or severe pain. 12/22/17   Fayrene Helperran, Bowie, PA-C  vitamin B-12 (CYANOCOBALAMIN) 1000 MCG tablet Take 1,000 mcg by mouth daily.    [provider]    Family History Family History  Problem Relation Age of Onset   COPD Mother    Diabetes Father     Social History Social History   Tobacco Use   Smoking status: Current Some Day Smoker    Packs/day: 0.25     Types: Cigarettes   Smokeless tobacco: Never Used  Substance Use Topics   Alcohol use: Yes    Comment: wine occ   Drug use: No     Allergies   Penicillins   Review of Systems Review of Systems  HENT: Positive for trouble swallowing (painful).   Respiratory: Positive for shortness of breath.   All other systems reviewed and are negative.    Physical Exam Updated Vital Signs BP (!) 162/140    Pulse 100    Temp 97.6 F (36.4 C)    Resp (!) 25    Ht 5' (1.524 m)    Wt 68 kg    SpO2 100%    BMI 29.29 kg/m   Physical Exam Vitals signs and nursing note reviewed.  Constitutional:      General: She is in acute distress.     Appearance: Normal appearance. She is well-developed. She is not ill-appearing.  HENT:     Head: Normocephalic and atraumatic.     Right Ear: Hearing normal.     Left Ear: Hearing normal.     Nose: Nose normal.  Eyes:     Conjunctiva/sclera: Conjunctivae normal.     Pupils: Pupils are equal, round, and reactive to light.  Neck:     Musculoskeletal: Normal range of motion and neck supple.     Comments: Mental and bilateral submandibular swelling noted Cardiovascular:     Rate and Rhythm: Regular rhythm.     Heart sounds: S1 normal and S2 normal. No murmur. No friction rub. No gallop.   Pulmonary:     Effort: Pulmonary effort is normal. No respiratory distress.     Breath sounds: Normal breath sounds.  Chest:     Chest wall: No tenderness.  Abdominal:     General: Bowel sounds are normal.     Palpations: Abdomen is soft.     Tenderness: There is no abdominal tenderness. There is no guarding or rebound. Negative signs include Murphy's sign and McBurney's sign.     Hernia: No hernia is present.  Musculoskeletal: Normal range of motion.  Skin:    General: Skin is warm and dry.     Findings: No rash.  Neurological:     Mental Status: She is alert and oriented to person, place, and time.     GCS: GCS eye subscore is 4. GCS verbal  subscore is 5. GCS  motor subscore is 6.     Cranial Nerves: No cranial nerve deficit.     Sensory: No sensory deficit.     Coordination: Coordination normal.  Psychiatric:        Speech: Speech normal.        Behavior: Behavior normal.        Thought Content: Thought content normal.        ED Treatments / Results  Labs (all labs ordered are listed, but only abnormal results are displayed) Labs Reviewed  CBC - Abnormal; Notable for the following components:      Result Value   WBC 10.9 (*)    All other components within normal limits  BASIC METABOLIC PANEL - Abnormal; Notable for the following components:   Potassium 3.4 (*)    Glucose, Bld 150 (*)    Calcium 8.7 (*)    All other components within normal limits  SARS CORONAVIRUS 2 (HOSPITAL ORDER, PERFORMED IN [facility])  TSH  T3, FREE  T4, FREE  I-STAT CREATININE, ED    EKG None  Radiology Ct Soft Tissue Neck W Contrast  Result Date: 09/28/2018 CLINICAL DATA:  58 y/o F; shortness of breath, neck swelling, and redness with onset tonight. EXAM: CT NECK WITH CONTRAST TECHNIQUE: Multidetector CT imaging of the neck was performed using the standard protocol following the bolus administration of intravenous contrast. CONTRAST:  75mL OMNIPAQUE IOHEXOL 300 MG/ML  SOLN COMPARISON:  None. FINDINGS: Pharynx and larynx: Normal. No mass or swelling. Salivary glands: Enlargement and heterogeneous appearance of the bilateral submandibular glands. Inflammation within the submandibular compartments as well as the superficial soft tissues of the upper anterior neck and mild edema within lower masticator compartments and parapharyngeal space. No abscess or sialolith. Normal appearance of the parotid glands. Thyroid: Normal. Lymph nodes: None enlarged or abnormal density. Vascular: Calcific atherosclerosis of the aorta and carotid systems. Retropharyngeal course of right internal carotid artery. Limited intracranial: Negative. Visualized orbits:  Negative. Mastoids and visualized paranasal sinuses: Clear. Skeleton: No acute or aggressive process. Upper chest: Centrilobular emphysema. Other: None. IMPRESSION: Inflammation centered in the bilateral submandibular compartments surrounding enlarged submandibular glands, probably acute submandibular sialadenitis. No abscess or sialolith identified. Electronically Signed   By: Mitzi HansenLance  Furusawa-Stratton M.D.   On: 09/28/2018 05:42    Procedures Procedures (including critical care time)  Angiocath insertion Performed by: Gilda Creasehristopher J Nea Gittens  Consent: Verbal consent obtained. Risks and benefits: risks, benefits and alternatives were discussed Time out: Immediately prior to procedure a "time out" was called to verify the correct patient, procedure, equipment, support staff and site/side marked as required.  Preparation: Patient was prepped and draped in the usual sterile fashion.  Vein Location: left upper arm  Ultrasound Guided  Gauge: 20  Normal blood return and flush without difficulty Patient tolerance: Patient tolerated the procedure well with no immediate complications.     Medications Ordered in ED Medications  clindamycin (CLEOCIN) IVPB 600 mg (has no administration in time range)  EPINEPHrine (EPI-PEN) injection 0.3 mg (0.3 mg Intramuscular Given 09/28/18 0346)  diphenhydrAMINE (BENADRYL) injection 50 mg (50 mg Intravenous Given 09/28/18 0346)  famotidine (PEPCID) IVPB 20 mg premix (0 mg Intravenous Stopped 09/28/18 0446)  methylPREDNISolone sodium succinate (SOLU-MEDROL) 125 mg/2 mL injection 125 mg (125 mg Intravenous Given 09/28/18 0347)  sodium chloride 0.9 % bolus 500 mL (0 mLs Intravenous Stopped 09/28/18 0446)  iohexol (OMNIPAQUE) 300 MG/ML solution 75 mL (75 mLs Intravenous Contrast Given 09/28/18  0511)     Initial Impression / Assessment and Plan / ED Course  I have reviewed the triage vital signs and the nursing notes.  Pertinent labs & imaging results that were  available during my care of the patient were reviewed by me and considered in my medical decision making (see chart for details).        Patient presents to the emergency department for evaluation of neck and throat swelling.  Patient reports that she awakened from sleep with severe swelling of her throat.  She was fine when she went to bed.  She has never had similar symptoms occur, although she thinks something like this may have happened when she had an allergic reaction to penicillin many years ago.  That is the only allergy she is aware of.  She does take lisinopril.  Angioedema reaction to lisinopril was considered.  Acute allergic reaction of another unknown trigger was also considered.  She was given epi, Benadryl, Pepcid, Solu-Medrol at arrival.  She did not have any improvement in her symptoms.  There was no intraoral swelling.  She was breathing fine.  Did not have any concern for her airway.  She was able to undergo CT scan to further evaluate the swelling.  She has diffuse salivary gland swelling.  No stones are visualized.  Etiology is unclear, but based on the rapid onset and the amount of swelling, will require observation and will empirically initiate antibiotic coverage for possible bacterial sialadenitis.  Final Clinical Impressions(s) / ED Diagnoses   Final diagnoses:  Sialadenitis    ED Discharge Orders    None       Jatavis Malek, Gwenyth Allegra, MD 09/28/18 [phone]

## 2018-09-28 NOTE — ED Triage Notes (Signed)
Pt came in via pov stating that she woke up with her dog beside her neck and "her neck blowed out" stating it is hard to breathe. Occurred right before she drove herself here.

## 2018-09-29 LAB — SJOGRENS SYNDROME-A EXTRACTABLE NUCLEAR ANTIBODY: SSA (Ro) (ENA) Antibody, IgG: <0.2 AI (ref 0.0–0.9)

## 2018-09-29 LAB — COMPREHENSIVE METABOLIC PANEL
ALT: 34 U/L (ref 0–44)
AST: 28 U/L (ref 15–41)
Albumin: 3.2 g/dL — ABNORMAL LOW (ref 3.5–5.0)
Alkaline Phosphatase: 63 U/L (ref 38–126)
Anion gap: 9 (ref 5–15)
BUN: 10 mg/dL (ref 6–20)
CO2: 25 mmol/L (ref 22–32)
Calcium: 8.9 mg/dL (ref 8.9–10.3)
Chloride: 105 mmol/L (ref 98–111)
Creatinine, Ser: 0.62 mg/dL (ref 0.44–1.00)
GFR calc Af Amer: >60 mL/min (ref >60)
GFR calc non Af Amer: >60 mL/min (ref >60)
Glucose, Bld: 112 mg/dL — ABNORMAL HIGH (ref 70–99)
Potassium: 3.4 mmol/L — ABNORMAL LOW (ref 3.5–5.1)
Sodium: 139 mmol/L (ref 135–145)
Total Bilirubin: 0.6 mg/dL (ref 0.3–1.2)
Total Protein: 6.8 g/dL (ref 6.5–8.1)

## 2018-09-29 LAB — CBC
HCT: 41.4 % (ref 36.0–46.0)
Hemoglobin: 13.9 g/dL (ref 12.0–15.0)
MCH: 30.7 pg (ref 26.0–34.0)
MCHC: 33.6 g/dL (ref 30.0–36.0)
MCV: 91.4 fL (ref 80.0–100.0)
Platelets: 225 10*3/uL (ref 150–400)
RBC: 4.53 MIL/uL (ref 3.87–5.11)
RDW: 13.1 % (ref 11.5–15.5)
WBC: 13.5 10*3/uL — ABNORMAL HIGH (ref 4.0–10.5)
nRBC: 0 % (ref 0.0–0.2)

## 2018-09-29 LAB — T3, FREE: T3, Free: 3.3 pg/mL (ref 2.0–4.4)

## 2018-09-29 LAB — SJOGRENS SYNDROME-B EXTRACTABLE NUCLEAR ANTIBODY: SSB (La) (ENA) Antibody, IgG: <0.2 AI (ref 0.0–0.9)

## 2018-09-29 LAB — ANA: Anti Nuclear Antibody (ANA): NEGATIVE

## 2018-09-29 MED ORDER — CLINDAMYCIN HCL 300 MG PO CAPS: 300.0000 mg | ORAL_CAPSULE | Freq: Three times a day (TID) | ORAL | 0 refills | Status: AC

## 2018-09-29 MED ORDER — POTASSIUM CHLORIDE CRYS ER 20 MEQ PO TBCR
40.0000 meq | EXTENDED_RELEASE_TABLET | Freq: Once | ORAL | Status: AC
  Administered 2018-09-29: 40 meq via ORAL
  Filled 2018-09-29: qty 2

## 2018-09-29 MED ORDER — AMLODIPINE BESYLATE 10 MG PO TABS: 10.0000 mg | ORAL_TABLET | Freq: Every day | ORAL | 0 refills | Status: DC

## 2018-09-29 MED ORDER — HYDROCHLOROTHIAZIDE 25 MG PO TABS: 25.0000 mg | ORAL_TABLET | Freq: Every day | ORAL | 0 refills | Status: DC

## 2018-09-29 NOTE — Discharge Summary (Signed)
Physician Discharge Summary  Brianna MarshallKaren [name] ZOX:[id number] DOB: 1960-04-04 DOA: 09/28/2018  PCP: Patient, No Pcp Per  Admit date: 09/28/2018 Discharge date: 09/29/2018  Admitted From: Home Disposition: Home  Recommendations for Outpatient Follow-up:  1. Follow up with PCP in 1 week  2. Outpatient follow-up with ENT/Dr. Suszanne Connerseoh 3. Follow up in ED if symptoms worsen or new appear 4. Lisinopril will be stopped till reevaluation by PCP   Home Health: No Equipment/Devices: None  Discharge Condition: Stable CODE STATUS: Full Diet recommendation: Heart healthy  Brief/Interim Summary: 58 year old female with history of hypertension, GERD, COPD, tobacco abuse presented with sudden onset of neck swelling.  CT soft tissue of the neck showed inflammation centered in the bilateral submandibular compartment surrounding enlarged submandibular glands, probably acute submandibular sialadenitis with no abscess or sialolith.  [facility] was [facility] and initially treated with steroids and clindamycin.  Clindamycin was continued.  ENT evaluated the patient and recommended IV hydration and continuation of clindamycin and outpatient follow-up with ENT.  Condition has improved.  Swelling is improving.  She will be discharged on oral clindamycin with ENT follow-up.  Discharge Diagnoses:  Principal Problem:   Submandibular gland swelling Active Problems:   Hypokalemia   Tobacco abuse   Essential hypertension   Hyperglycemia  Acute bilateral submandibular sialadenitis - CT soft tissue of the neck showed inflammation centered in the bilateral submandibular compartment surrounding enlarged submandibular glands, probably acute submandibular sialadenitis with no abscess or sialolith.   -She was transferred to [facility] and initially treated with steroids and clindamycin.   -Clindamycin was continued.   -ENT evaluated the patient and recommended IV hydration and continuation of  clindamycin and outpatient follow-up with ENT.  Condition has improved.  Swelling is improving.  She will be switched to heart healthy diet today.  If he tolerates that, she will be discharged on oral clindamycin with ENT follow-up. -No temperature spikes.  Hypokalemia -Replace prior to discharge.  Hypertension -Continue hydrochlorothiazide on discharge.  Increase amlodipine to 10 mg daily.  Hold lisinopril till evaluation by PCP.  Doubt this is an allergic reaction to lisinopril though.  COPD Ongoing chronic tobacco use  -Counseled about tobacco cessation.  Continue albuterol as needed.  Outpatient follow-up   Discharge Instructions  Discharge Instructions    Diet - low sodium heart healthy   Complete by: As directed    Increase activity slowly   Complete by: As directed      Allergies as of 09/29/2018      Reactions   Penicillins Swelling   Throat swelling Has patient had a PCN reaction causing immediate rash, facial/tongue/throat swelling, SOB or lightheadedness with hypotension: Yes Has patient had a PCN reaction causing severe rash involving mucus membranes or skin necrosis: No Has patient had a PCN reaction that required hospitalization  ED visit but no hospitalization Has patient had a PCN reaction occurring within the last 10 years: No If all of the above answers are "NO", then may proceed with Cephalosporin use.      Medication List    STOP taking these medications   lisinopril 20 MG tablet Commonly known as: ZESTRIL     TAKE these medications   albuterol (2.5 MG/3ML) 0.083% nebulizer solution Commonly known as: PROVENTIL Take 3 mLs (2.5 mg total) by nebulization every 6 (six) hours as needed for wheezing or shortness of breath.   amLODipine 10 MG tablet Commonly known as: NORVASC Take 1 tablet (10 mg total) by mouth daily. What  changed:   medication strength  how much to take  when to take this   aspirin EC 81 MG tablet Take 81 mg by mouth daily.    clindamycin 300 MG capsule Commonly known as: Cleocin Take 1 capsule (300 mg total) by mouth 3 (three) times daily for 7 days.   hydrochlorothiazide 25 MG tablet Commonly known as: HYDRODIURIL Take 1 tablet (25 mg total) by mouth daily.   Melatonin 10 MG Tabs Take 10 mg by mouth at bedtime as needed (sleep).      Follow-up Information    PCP. Schedule an appointment as soon as possible for a visit in 1 week(s).        Leta Baptist, MD. Schedule an appointment as soon as possible for a visit in 1 week(s).   Specialty: Otolaryngology Contact information: [address] [place] [address] [phone]          Allergies  Allergen Reactions  . Penicillins Swelling    Throat swelling Has patient had a PCN reaction causing immediate rash, facial/tongue/throat swelling, SOB or lightheadedness with hypotension: Yes Has patient had a PCN reaction causing severe rash involving mucus membranes or skin necrosis: No Has patient had a PCN reaction that required hospitalization  ED visit but no hospitalization Has patient had a PCN reaction occurring within the last 10 years: No If all of the above answers are "NO", then may proceed with Cephalosporin use.    Consultations:  ENT   Procedures/Studies: Ct Soft Tissue Neck W Contrast  Result Date: 09/28/2018 CLINICAL DATA:  58 y/o F; shortness of breath, neck swelling, and redness with onset tonight. EXAM: CT NECK WITH CONTRAST TECHNIQUE: Multidetector CT imaging of the neck was performed using the standard protocol following the bolus administration of intravenous contrast. CONTRAST:  95mL OMNIPAQUE IOHEXOL 300 MG/ML  SOLN COMPARISON:  None. FINDINGS: Pharynx and larynx: Normal. No mass or swelling. Salivary glands: Enlargement and heterogeneous appearance of the bilateral submandibular glands. Inflammation within the submandibular compartments as well as the superficial soft tissues of the upper anterior neck and mild edema  within lower masticator compartments and parapharyngeal space. No abscess or sialolith. Normal appearance of the parotid glands. Thyroid: Normal. Lymph nodes: None enlarged or abnormal density. Vascular: Calcific atherosclerosis of the aorta and carotid systems. Retropharyngeal course of right internal carotid artery. Limited intracranial: Negative. Visualized orbits: Negative. Mastoids and visualized paranasal sinuses: Clear. Skeleton: No acute or aggressive process. Upper chest: Centrilobular emphysema. Other: None. IMPRESSION: Inflammation centered in the bilateral submandibular compartments surrounding enlarged submandibular glands, probably acute submandibular sialadenitis. No abscess or sialolith identified. Electronically Signed   By: Kristine Garbe M.D.   On: 09/28/2018 05:42       Subjective: Patient seen and examined at bedside.  She feels much better and feels that her swelling in her neck is improving.  No worsening shortness of breath or fever overnight fever.  States  that she might be able to go home today.  Discharge Exam: Vitals:   09/29/18 0020 09/29/18 0430  BP:    Pulse:    Resp:    Temp: 97.7 F (36.5 C) (!) 97.5 F (36.4 C)  SpO2:      General: Pt is alert, awake, not in acute distress Neck: Bilateral submandibular areas with swelling and slight erythema but no tenderness. Cardiovascular: rate controlled, S1/S2 + Respiratory: bilateral decreased breath sounds at bases with some scattered crackles Abdominal: Soft, NT, ND, bowel sounds + Extremities: no edema,  no cyanosis    The results of significant diagnostics from this hospitalization (including imaging, microbiology, ancillary and laboratory) are listed below for reference.     Microbiology: Recent Results (from the past 240 hour(s))  SARS Coronavirus 2 (CEPHEID - Performed in [facility] hospital lab), Hosp Order     Status: None   Collection Time: 09/28/18  5:54 AM   Specimen: Nasopharyngeal  Swab  Result Value Ref Range Status   SARS Coronavirus 2 NEGATIVE NEGATIVE Final    Comment: (NOTE) If result is NEGATIVE SARS-CoV-2 target nucleic acids are NOT DETECTED. The SARS-CoV-2 RNA is generally detectable in upper and lower  respiratory specimens during the acute phase of infection. The lowest  concentration of SARS-CoV-2 viral copies this assay can detect is 250  copies / mL. A negative result does not preclude SARS-CoV-2 infection  and should not be used as the sole basis for treatment or other  patient management decisions.  A negative result may occur with  improper specimen collection / handling, submission of specimen other  than nasopharyngeal swab, presence of viral mutation(s) within the  areas targeted by this assay, and inadequate number of viral copies  (<250 copies / mL). A negative result must be combined with clinical  observations, patient history, and epidemiological information. If result is POSITIVE SARS-CoV-2 target nucleic acids are DETECTED. The SARS-CoV-2 RNA is generally detectable in upper and lower  respiratory specimens dur ing the acute phase of infection.  Positive  results are indicative of active infection with SARS-CoV-2.  Clinical  correlation with patient history and other diagnostic information is  necessary to determine patient infection status.  Positive results do  not rule out bacterial infection or co-infection with other viruses. If result is PRESUMPTIVE POSTIVE SARS-CoV-2 nucleic acids MAY BE PRESENT.   A presumptive positive result was obtained on the submitted specimen  and confirmed on repeat testing.  While 2019 novel coronavirus  (SARS-CoV-2) nucleic acids may be present in the submitted sample  additional confirmatory testing may be necessary for epidemiological  and / or clinical management purposes  to differentiate between  SARS-CoV-2 and other Sarbecovirus currently known to infect humans.  If clinically indicated  additional testing with an alternate test  methodology [phone]) is advised. The SARS-CoV-2 RNA is generally  detectable in upper and lower respiratory sp ecimens during the acute  phase of infection. The expected result is Negative. Fact Sheet for Patients:  BoilerBrush.com.cy Fact Sheet for Healthcare Providers: https://pope.com/ This test is not yet approved or cleared by the [place] FDA and has been authorized for detection and/or diagnosis of SARS-CoV-2 by FDA under an Emergency Use Authorization (EUA).  This EUA will remain in effect (meaning this test can be used) for the duration of the COVID-19 declaration under Section 564(b)(1) of the Act, 21 U.S.C. section 360bbb-3(b)(1), unless the authorization is terminated or revoked sooner. Performed at [facility], [address] [place], [place] [address]   Respiratory Panel by PCR     Status: None   Collection Time: 09/28/18 10:42 AM   Specimen: Nasopharyngeal Swab; Respiratory  Result Value Ref Range Status   Adenovirus NOT DETECTED NOT DETECTED Final   Coronavirus 229E NOT DETECTED NOT DETECTED Final    Comment: (NOTE) The Coronavirus on the Respiratory Panel, DOES NOT test for the novel  Coronavirus (2019 nCoV)    Coronavirus HKU1 NOT DETECTED NOT DETECTED Final   Coronavirus NL63 NOT DETECTED NOT DETECTED Final   Coronavirus OC43 NOT DETECTED  NOT DETECTED Final   Metapneumovirus NOT DETECTED NOT DETECTED Final   Rhinovirus / Enterovirus NOT DETECTED NOT DETECTED Final   Influenza A NOT DETECTED NOT DETECTED Final   Influenza B NOT DETECTED NOT DETECTED Final   Parainfluenza Virus 1 NOT DETECTED NOT DETECTED Final   Parainfluenza Virus 2 NOT DETECTED NOT DETECTED Final   Parainfluenza Virus 3 NOT DETECTED NOT DETECTED Final   Parainfluenza Virus 4 NOT DETECTED NOT DETECTED Final   Respiratory Syncytial Virus NOT DETECTED NOT DETECTED Final   Bordetella pertussis NOT  DETECTED NOT DETECTED Final   Chlamydophila pneumoniae NOT DETECTED NOT DETECTED Final   Mycoplasma pneumoniae NOT DETECTED NOT DETECTED Final    Comment: Performed at [facility], [address] [place], [place] [address]  MRSA PCR Screening     Status: None   Collection Time: 09/28/18 10:42 AM   Specimen: Nasal Mucosa; Nasopharyngeal  Result Value Ref Range Status   MRSA by PCR NEGATIVE NEGATIVE Final    Comment:        The GeneXpert MRSA Assay (FDA approved for NASAL specimens only), is one component of a comprehensive MRSA colonization surveillance program. It is not intended to diagnose MRSA infection nor to guide or monitor treatment for MRSA infections. Performed at [facility], [address] [place], [place] [address]      Labs: BNP (last 3 results) No results for input(s): BNP in the last 8760 hours. Basic Metabolic Panel: Recent Labs  Lab 09/28/18 0441 09/29/18 0337  NA 141 139  K 3.4* 3.4*  CL 107 105  CO2 22 25  GLUCOSE 150* 112*  BUN 19 10  CREATININE 0.78  0.70 0.62  CALCIUM 8.7* 8.9   Liver Function Tests: Recent Labs  Lab 09/28/18 1107 09/29/18 0337  AST 40 28  ALT 43 34  ALKPHOS 75 63  BILITOT 0.5 0.6  PROT 7.4 6.8  ALBUMIN 3.5 3.2*   No results for input(s): LIPASE, AMYLASE in the last 168 hours. No results for input(s): AMMONIA in the last 168 hours. CBC: Recent Labs  Lab 09/28/18 0441 09/29/18 0337  WBC 10.9* 13.5*  HGB 14.4 13.9  HCT 44.4 41.4  MCV 92.9 91.4  PLT 227 225   Cardiac Enzymes: No results for input(s): CKTOTAL, CKMB, CKMBINDEX, TROPONINI in the last 168 hours. BNP: Invalid input(s): POCBNP CBG: No results for input(s): GLUCAP in the last 168 hours. D-Dimer No results for input(s): DDIMER in the last 72 hours. Hgb A1c Recent Labs    09/28/18 0652  HGBA1C 5.5   Lipid Profile No results for input(s): CHOL, HDL, LDLCALC, TRIG, CHOLHDL, LDLDIRECT in the last 72 hours. Thyroid function  studies Recent Labs    09/28/18 0441  TSH 3.372   Anemia work up No results for input(s): VITAMINB12, FOLATE, FERRITIN, TIBC, IRON, RETICCTPCT in the last 72 hours. Urinalysis    Component Value Date/Time   COLORURINE YELLOW 09/11/2017 0923   APPEARANCEUR HAZY (A) 09/11/2017 0923   LABSPEC 1.020 09/11/2017 0923   PHURINE 6.5 09/11/2017 0923   GLUCOSEU NEGATIVE 09/11/2017 0923   HGBUR NEGATIVE 09/11/2017 0923   BILIRUBINUR NEGATIVE 09/11/2017 0923   KETONESUR NEGATIVE 09/11/2017 0923   PROTEINUR NEGATIVE 09/11/2017 0923   UROBILINOGEN 0.2 11/29/2013 1916   NITRITE NEGATIVE 09/11/2017 0923   LEUKOCYTESUR TRACE (A) 09/11/2017 0923   Sepsis Labs Invalid input(s): PROCALCITONIN,  WBC,  LACTICIDVEN Microbiology Recent Results (from the past 240 hour(s))  SARS Coronavirus 2 (CEPHEID -  Performed in [facility] hospital lab), Hosp Order     Status: None   Collection Time: 09/28/18  5:54 AM   Specimen: Nasopharyngeal Swab  Result Value Ref Range Status   SARS Coronavirus 2 NEGATIVE NEGATIVE Final    Comment: (NOTE) If result is NEGATIVE SARS-CoV-2 target nucleic acids are NOT DETECTED. The SARS-CoV-2 RNA is generally detectable in upper and lower  respiratory specimens during the acute phase of infection. The lowest  concentration of SARS-CoV-2 viral copies this assay can detect is 250  copies / mL. A negative result does not preclude SARS-CoV-2 infection  and should not be used as the sole basis for treatment or other  patient management decisions.  A negative result may occur with  improper specimen collection / handling, submission of specimen other  than nasopharyngeal swab, presence of viral mutation(s) within the  areas targeted by this assay, and inadequate number of viral copies  (<250 copies / mL). A negative result must be combined with clinical  observations, patient history, and epidemiological information. If result is POSITIVE SARS-CoV-2 target nucleic acids are  DETECTED. The SARS-CoV-2 RNA is generally detectable in upper and lower  respiratory specimens dur ing the acute phase of infection.  Positive  results are indicative of active infection with SARS-CoV-2.  Clinical  correlation with patient history and other diagnostic information is  necessary to determine patient infection status.  Positive results do  not rule out bacterial infection or co-infection with other viruses. If result is PRESUMPTIVE POSTIVE SARS-CoV-2 nucleic acids MAY BE PRESENT.   A presumptive positive result was obtained on the submitted specimen  and confirmed on repeat testing.  While 2019 novel coronavirus  (SARS-CoV-2) nucleic acids may be present in the submitted sample  additional confirmatory testing may be necessary for epidemiological  and / or clinical management purposes  to differentiate between  SARS-CoV-2 and other Sarbecovirus currently known to infect humans.  If clinically indicated additional testing with an alternate test  methodology [phone]([id number]) is advised. The SARS-CoV-2 RNA is generally  detectable in upper and lower respiratory sp ecimens during the acute  phase of infection. The expected result is Negative. Fact Sheet for Patients:  BoilerBrush.com.cyhttps://www.fda.gov/media/136312/download Fact Sheet for Healthcare Providers: https://pope.com/https://www.fda.gov/media/136313/download This test is not yet approved or cleared by the [place] FDA and has been authorized for detection and/or diagnosis of SARS-CoV-2 by FDA under an Emergency Use Authorization (EUA).  This EUA will remain in effect (meaning this test can be used) for the duration of the COVID-19 declaration under Section 564(b)(1) of the Act, 21 U.S.C. section 360bbb-3(b)(1), unless the authorization is terminated or revoked sooner. Performed at [facility], [address] [place], [place] [address]   Respiratory Panel by PCR     Status: None   Collection Time: 09/28/18 10:42 AM   Specimen: Nasopharyngeal  Swab; Respiratory  Result Value Ref Range Status   Adenovirus NOT DETECTED NOT DETECTED Final   Coronavirus 229E NOT DETECTED NOT DETECTED Final    Comment: (NOTE) The Coronavirus on the Respiratory Panel, DOES NOT test for the novel  Coronavirus (2019 nCoV)    Coronavirus HKU1 NOT DETECTED NOT DETECTED Final   Coronavirus NL63 NOT DETECTED NOT DETECTED Final   Coronavirus OC43 NOT DETECTED NOT DETECTED Final   Metapneumovirus NOT DETECTED NOT DETECTED Final   Rhinovirus / Enterovirus NOT DETECTED NOT DETECTED Final   Influenza A NOT DETECTED NOT DETECTED Final   Influenza B NOT DETECTED NOT DETECTED Final   Parainfluenza  Virus 1 NOT DETECTED NOT DETECTED Final   Parainfluenza Virus 2 NOT DETECTED NOT DETECTED Final   Parainfluenza Virus 3 NOT DETECTED NOT DETECTED Final   Parainfluenza Virus 4 NOT DETECTED NOT DETECTED Final   Respiratory Syncytial Virus NOT DETECTED NOT DETECTED Final   Bordetella pertussis NOT DETECTED NOT DETECTED Final   Chlamydophila pneumoniae NOT DETECTED NOT DETECTED Final   Mycoplasma pneumoniae NOT DETECTED NOT DETECTED Final    Comment: Performed at [facility], [address] [place], [place] [address]  MRSA PCR Screening     Status: None   Collection Time: 09/28/18 10:42 AM   Specimen: Nasal Mucosa; Nasopharyngeal  Result Value Ref Range Status   MRSA by PCR NEGATIVE NEGATIVE Final    Comment:        The GeneXpert MRSA Assay (FDA approved for NASAL specimens only), is one component of a comprehensive MRSA colonization surveillance program. It is not intended to diagnose MRSA infection nor to guide or monitor treatment for MRSA infections. Performed at [facility], [address] [place], [place] [address]      Time coordinating discharge: 35 minutes  SIGNED:   Glade LloydKshitiz Wandalene Abrams, MD  [facility] 09/29/2018, 9:20 AM

## 2018-09-29 NOTE — Progress Notes (Signed)
Received call from RN stating that pt doesn't have a PCP. She lives in [place]. Met with pt. She reports that she was transferred from [facility] and she has a SW from [facility] that is helping her and she is going to get her an orange card. She denies any issues filling her new prescriptions.  

## 2018-09-29 NOTE — Progress Notes (Signed)
Nsg Discharge Note  Admit Date:  09/28/2018 Discharge date: 09/29/2018   [name] to be D/C'd Home per MD order.  AVS completed.  Copy for chart, and copy for patient signed, and dated. Patient/caregiver able to verbalize understanding.  Discharge Medication: Allergies as of 09/29/2018      Reactions   Penicillins Swelling   Throat swelling Has patient had a PCN reaction causing immediate rash, facial/tongue/throat swelling, SOB or lightheadedness with hypotension: Yes Has patient had a PCN reaction causing severe rash involving mucus membranes or skin necrosis: No Has patient had a PCN reaction that required hospitalization  ED visit but no hospitalization Has patient had a PCN reaction occurring within the last 10 years: No If all of the above answers are "NO", then may proceed with Cephalosporin use.      Medication List    STOP taking these medications   lisinopril 20 MG tablet Commonly known as: ZESTRIL     TAKE these medications   albuterol (2.5 MG/3ML) 0.083% nebulizer solution Commonly known as: PROVENTIL Take 3 mLs (2.5 mg total) by nebulization every 6 (six) hours as needed for wheezing or shortness of breath.   amLODipine 10 MG tablet Commonly known as: NORVASC Take 1 tablet (10 mg total) by mouth daily. What changed:   medication strength  how much to take  when to take this Notes to patient: 09/30/2018   aspirin EC 81 MG tablet Take 81 mg by mouth daily. Notes to patient: 09/30/2018   clindamycin 300 MG capsule Commonly known as: Cleocin Take 1 capsule (300 mg total) by mouth 3 (three) times daily for 7 days. Notes to patient: 09/29/2018   hydrochlorothiazide 25 MG tablet Commonly known as: HYDRODIURIL Take 1 tablet (25 mg total) by mouth daily. Notes to patient: 09/30/2018   Melatonin 10 MG Tabs Take 10 mg by mouth at bedtime as needed (sleep).       Discharge Assessment: Vitals:   09/29/18 0020 09/29/18 0430  BP:    Pulse:    Resp:    Temp:  97.7 F (36.5 C) (!) 97.5 F (36.4 C)  SpO2:     Skin clean, dry and intact without evidence of skin break down, no evidence of skin tears noted. IV catheter discontinued intact. Site without signs and symptoms of complications - no redness or edema noted at insertion site, patient denies c/o pain - only slight tenderness at site.  Dressing with slight pressure applied.  D/c Instructions-Education: Discharge instructions given to patient/family with verbalized understanding. D/c education completed with patient/family including follow up instructions, medication list, d/c activities limitations if indicated, with other d/c instructions as indicated by MD - patient able to verbalize understanding, all questions fully answered. Patient instructed to return to ED, call 911, or call MD for any changes in condition.  Patient escorted via [place], and D/C home via private auto.  Tresa Endo, RN 09/29/2018 10:18 AM

## 2018-09-30 LAB — EPSTEIN-BARR VIRUS (EBV) ANTIBODY PROFILE
EBV NA IgG: >600 U/mL — ABNORMAL HIGH (ref 0.0–17.9)
EBV VCA IgG: 130 U/mL — ABNORMAL HIGH (ref 0.0–17.9)
EBV VCA IgM: <36 U/mL (ref 0.0–35.9)

## 2018-10-01 LAB — MUMPS ANTIBODY, IGM: Mumps IgM: <0.8 AU (ref 0.00–0.79)

## 2018-10-01 LAB — MUMPS ANTIBODY, IGG: Mumps IgG: >300 AU/mL (ref >10.9)

## 2018-10-01 LAB — ANCA TITERS
Atypical P-ANCA titer: <1:20 {titer}
C-ANCA: <1:20 {titer}
P-ANCA: <1:20 {titer}

## 2018-10-01 LAB — IGG 4: IgG, Subclass 4: 27 mg/dL (ref 2–96)

## 2018-10-02 LAB — PARVOVIRUS B19 ANTIBODY, IGG AND IGM
Parovirus B19 IgG Abs: 7.6 index — ABNORMAL HIGH (ref 0.0–0.8)
Parovirus B19 IgM Abs: 0.3 index (ref 0.0–0.8)

## 2018-10-02 LAB — EPSTEIN BARR VRS(EBV DNA BY PCR)
EBV DNA QN by PCR: NEGATIVE copies/mL
log10 EBV DNA Qn PCR: UNDETERMINED log10 copy/mL

## 2018-10-03 LAB — ADENOVIRUS ANTIBODIES: Adenovirus Antibody: 1:256 {titer} — ABNORMAL HIGH

## 2018-10-18 ENCOUNTER — Ambulatory Visit (INDEPENDENT_AMBULATORY_CARE_PROVIDER_SITE_OTHER): Payer: Self-pay | Admitting: Otolaryngology

## 2018-10-22 ENCOUNTER — Ambulatory Visit (INDEPENDENT_AMBULATORY_CARE_PROVIDER_SITE_OTHER): Payer: Self-pay

## 2018-10-22 ENCOUNTER — Other Ambulatory Visit: Payer: Self-pay

## 2018-10-22 ENCOUNTER — Ambulatory Visit
Admission: EM | Admit: 2018-10-22 | Discharge: 2018-10-22 | Disposition: A | Discharge status: Home / Self Care | Payer: Self-pay

## 2018-10-22 DIAGNOSIS — M545 Low back pain: Secondary | ICD-10-CM

## 2018-10-22 DIAGNOSIS — W010XXA Fall on same level from slipping, tripping and stumbling without subsequent striking against object, initial encounter: Secondary | ICD-10-CM

## 2018-10-22 DIAGNOSIS — M533 Sacrococcygeal disorders, not elsewhere classified: Secondary | ICD-10-CM

## 2018-10-22 DIAGNOSIS — S322XXA Fracture of coccyx, initial encounter for closed fracture: Secondary | ICD-10-CM

## 2018-10-22 DIAGNOSIS — S3210XA Unspecified fracture of sacrum, initial encounter for closed fracture: Secondary | ICD-10-CM

## 2018-10-22 MED ORDER — TRAMADOL HCL 50 MG PO TABS: 50.0000 mg | ORAL_TABLET | Freq: Two times a day (BID) | ORAL | 0 refills | Status: DC | PRN

## 2018-10-22 MED ORDER — KETOROLAC TROMETHAMINE 60 MG/2ML IM SOLN
60.0000 mg | Freq: Once | INTRAMUSCULAR | Status: AC
  Administered 2018-10-22: 60 mg via INTRAMUSCULAR

## 2018-10-22 NOTE — ED Triage Notes (Signed)
Pt presents with complaints of falling yesterday at [facility]. States she slipped on standing water in an [place]. Complaints of lower back pain and right hip pain. Pt states she landed on her tail bone when she fell and right leg went underneath her. Hx of back issues prior to.

## 2018-10-22 NOTE — ED Provider Notes (Signed)
[facility]   [id number] 10/22/18 Arrival Time: 1042  CC: Low back pain  SUBJECTIVE: History from: patient. Chanetta MarshallKaren Island is a 58 y.o. female complains of low back pain and RT buttock pain that began 1 day ago.  States she fell [place] [employer].  Patient reports there was a puddle of standing water that she did not see, and slipped while walking through it.  She landed on her back.  Localizes the pain to the low back, RT buttock, and down right leg.  Describes the pain as constant and "searing" in character.  Has tried OTC medications without relief.  Symptoms are made worse with walking, sitting, and leaning backwards.  Reports hx of DDD in the past, but never had pain shoot down her leg.  Denies fever, chills, nausea, vomiting, erythema, ecchymosis, effusion, weakness, numbness and tingling, saddle paresthesias, loss of bowel or bladder function.      ROS: As per HPI.  All other pertinent ROS negative.     Past Medical History:  Diagnosis Date   Anxiety    Arthritis    Constipation    COPD (chronic obstructive pulmonary disease) (HCC)    Family history of adverse reaction to anesthesia    brother was "allergic" to anesthesia - heart stopped beating on the surgery table   Gallbladder sludge    GERD (gastroesophageal reflux disease)    Headache    used to have migraines   Hypertension    Palpitations    Staph infection    Past Surgical History:  Procedure Laterality Date   CHOLECYSTECTOMY N/A 09/13/2017   Procedure: LAPAROSCOPIC CHOLECYSTECTOMY;  Surgeon: Lucretia RoersBridges, Lindsay C, MD;  Location: [facility];  Service: General;  Laterality: N/A;   GROIN DEBRIDEMENT     due to spider bite,    OPEN REDUCTION INTERNAL FIXATION (ORIF) PROXIMAL PHALANX Right 04/07/2015   Procedure: OPEN REDUCTION INTERNAL FIXATION (ORIF) PROXIMAL PHALANX;  Surgeon: Cammy CopaScott Gregory Dean, MD;  Location: [facility];  Service: Orthopedics;  Laterality: Right;   TONSILLECTOMY     TUBAL LIGATION      Allergies  Allergen Reactions   Penicillins Swelling    Throat swelling Has patient had a PCN reaction causing immediate rash, facial/tongue/throat swelling, SOB or lightheadedness with hypotension: Yes Has patient had a PCN reaction causing severe rash involving mucus membranes or skin necrosis: No Has patient had a PCN reaction that required hospitalization  ED visit but no hospitalization Has patient had a PCN reaction occurring within the last 10 years: No If all of the above answers are "NO", then may proceed with Cephalosporin use.   No current facility-administered medications on file prior to encounter.    Current Outpatient Medications on File Prior to Encounter  Medication Sig Dispense Refill   thiamine (VITAMIN B-1) 100 MG tablet Take 100 mg by mouth daily.     albuterol (PROVENTIL) (2.5 MG/3ML) 0.083% nebulizer solution Take 3 mLs (2.5 mg total) by nebulization every 6 (six) hours as needed for wheezing or shortness of breath. 75 mL 12   amLODipine (NORVASC) 10 MG tablet Take 1 tablet (10 mg total) by mouth daily. 30 tablet 0   aspirin EC 81 MG tablet Take 81 mg by mouth daily.     hydrochlorothiazide (HYDRODIURIL) 25 MG tablet Take 1 tablet (25 mg total) by mouth daily. 30 tablet 0   Melatonin 10 MG TABS Take 10 mg by mouth at bedtime as needed (sleep).      Social History  Socioeconomic History   Marital status: Married    Spouse name: Not on file   Number of children: Not on file   Years of education: Not on file   Highest education level: Not on file  Occupational History   Not on file  Social Needs   Financial resource strain: Not on file   Food insecurity    Worry: Not on file    Inability: Not on file   Transportation needs    Medical: Not on file    Non-medical: Not on file  Tobacco Use   Smoking status: Current Some Day Smoker    Packs/day: 0.25    Types: Cigarettes   Smokeless tobacco: Never Used  Substance and Sexual Activity    Alcohol use: Yes    Comment: wine occ   Drug use: No   Sexual activity: Not on file  Lifestyle   Physical activity    Days per week: Not on file    Minutes per session: Not on file   Stress: Not on file  Relationships   Social connections    Talks on phone: Not on file    Gets together: Not on file    Attends religious service: Not on file    Active member of club or organization: Not on file    Attends meetings of clubs or organizations: Not on file    Relationship status: Not on file   Intimate partner violence    Fear of current or ex partner: Not on file    Emotionally abused: Not on file    Physically abused: Not on file    Forced sexual activity: Not on file  Other Topics Concern   Not on file  Social History Narrative   Not on file   Family History  Problem Relation Age of Onset   COPD Mother    Diabetes Father     OBJECTIVE:  Vitals:   10/22/18 1054  BP: (!) 149/80  Pulse: 74  Resp: 19  Temp: 98.2 F (36.8 C)  SpO2: 94%    General appearance: ALERT; appears uncomfortable Head: NCAT Lungs: Normal respiratory effort; CTAB; persistent cough CV: RRR Musculoskeletal: Back Inspection: Skin warm, dry, clear and intact without obvious erythema, effusion, or ecchymosis.  Palpation: TTP over lumbar side, and RT buttocks ROM: FROM active and passive Strength: 5/5 shld abduction, 5/5 shld adduction, 5/5 elbow flexion, 5/5 elbow extension, 5/5 grip strength, 5/5 hip flexion, 5/5 knee abduction, 5/5 knee adduction, 5/5 knee flexion, 5/5 knee extension, 5/5 dorsiflexion, 5/5 plantar flexion PTR: Intact and equal bilaterally  Skin: warm and dry Neurologic: Ambulates without difficulty; Sensation intact about the upper/ lower extremities Psychological: alert and cooperative; normal mood and affect  DIAGNOSTIC STUDIES:  Dg Lumbar Spine Complete  Result Date: 10/22/2018 CLINICAL DATA:  Low back and buttock pain secondary to a fall yesterday. EXAM:  LUMBAR SPINE - COMPLETE 4+ VIEW COMPARISON:  CT scan abdomen dated 11/29/2013 FINDINGS: There is no evidence of a lumbar or sacral fracture. The coccyx is not completely visualized. There is chronic degenerative disc disease at L3-4 and L5-S1, unchanged since the prior CT scan. Slight right facet arthritis at L4-5. Aortic atherosclerosis. IMPRESSION: No acute abnormality of the lumbar spine or sacrum. Electronically Signed   By: Francene BoyersJames  Maxwell M.D.   On: 10/22/2018 11:51    X-rays concerning for possible coccyx fracture.  Radiologist states coccyx not completely visualized.  Stable chronic DDD.  ASSESSMENT & PLAN:  1. Coccydynia  2. Coccyx pain   3. Fall due to wet surface, initial encounter     Meds ordered this encounter  Medications   ketorolac (TORADOL) injection 60 mg   traMADol (ULTRAM) 50 MG tablet    Sig: Take 1 tablet (50 mg total) by mouth every 12 (twelve) hours as needed.    Dispense:  8 tablet    Refill:  0    Order Specific Question:   Supervising Provider    Answer:   Raylene Everts [[id number]]   Toradol shot given in office X-rays did not show obvious fracture Use wedge or donut pillow when sitting Continue conservative management of rest, and ice Continue with tylenol during the day Take tramadol for severe break-through pain.  DO NOT TAKE prior to driving or operating heavy machinery Follow up with orthopedist for further evaluation and management Return or go to the ER if you have any new or worsening symptoms (fever, chills, chest pain, abdominal pain, changes in bowel or bladder habits, loss of bowel or bladder habits, numbness/ tingling in pelvis, etc...)   Reviewed expectations re: course of current medical issues. Questions answered. Outlined signs and symptoms indicating need for more acute intervention. Patient verbalized understanding. After Visit Summary given.    Lestine Box, PA-C 10/22/18 1241

## 2018-10-22 NOTE — Discharge Instructions (Signed)
Toradol shot given in office X-rays did not show fracture Use wedge or donut pillow when sitting Continue conservative management of rest, and ice Continue with tylenol during the day Take tramadol for severe break-through pain.  DO NOT TAKE prior to driving or operating heavy machinery Follow up with orthopedist for further evaluation and management Return or go to the ER if you have any new or worsening symptoms (fever, chills, chest pain, abdominal pain, changes in bowel or bladder habits, loss of bowel or bladder habits, numbness/ tingling in pelvis, etc...)

## 2018-11-08 ENCOUNTER — Other Ambulatory Visit: Payer: Self-pay | Admitting: Internal Medicine

## 2018-11-08 DIAGNOSIS — Z20822 Contact with and (suspected) exposure to covid-19: Secondary | ICD-10-CM

## 2018-11-10 LAB — NOVEL CORONAVIRUS, NAA: SARS-CoV-2, NAA: NOT DETECTED

## 2018-11-12 ENCOUNTER — Telehealth: Payer: Self-pay | Admitting: General Practice

## 2018-11-12 NOTE — Telephone Encounter (Signed)
Patient called in for the results of her Covid-19 test.  She was told that Covid was Not Detected. °

## 2019-01-01 ENCOUNTER — Emergency Department (HOSPITAL_COMMUNITY)
Admission: EM | Admit: 2019-01-01 | Discharge: 2019-01-01 | Disposition: A | Discharge status: Home / Self Care | Payer: Self-pay | Attending: Emergency Medicine | Admitting: Emergency Medicine

## 2019-01-01 ENCOUNTER — Emergency Department (HOSPITAL_COMMUNITY): Payer: Self-pay

## 2019-01-01 ENCOUNTER — Other Ambulatory Visit: Payer: Self-pay

## 2019-01-01 ENCOUNTER — Encounter (HOSPITAL_COMMUNITY): Payer: Self-pay | Admitting: Emergency Medicine

## 2019-01-01 DIAGNOSIS — Z79899 Other long term (current) drug therapy: Secondary | ICD-10-CM | POA: Insufficient documentation

## 2019-01-01 DIAGNOSIS — R070 Pain in throat: Secondary | ICD-10-CM | POA: Insufficient documentation

## 2019-01-01 DIAGNOSIS — Z7982 Long term (current) use of aspirin: Secondary | ICD-10-CM | POA: Insufficient documentation

## 2019-01-01 DIAGNOSIS — J449 Chronic obstructive pulmonary disease, unspecified: Secondary | ICD-10-CM | POA: Insufficient documentation

## 2019-01-01 DIAGNOSIS — I10 Essential (primary) hypertension: Secondary | ICD-10-CM | POA: Insufficient documentation

## 2019-01-01 DIAGNOSIS — F17210 Nicotine dependence, cigarettes, uncomplicated: Secondary | ICD-10-CM | POA: Insufficient documentation

## 2019-01-01 LAB — CBC WITH DIFFERENTIAL/PLATELET
Abs Immature Granulocytes: 0.04 10*3/uL (ref 0.00–0.07)
Basophils Absolute: 0.1 10*3/uL (ref 0.0–0.1)
Basophils Relative: 1 %
Eosinophils Absolute: 0.2 10*3/uL (ref 0.0–0.5)
Eosinophils Relative: 2 %
HCT: 47.8 % — ABNORMAL HIGH (ref 36.0–46.0)
Hemoglobin: 15.2 g/dL — ABNORMAL HIGH (ref 12.0–15.0)
Immature Granulocytes: 0 %
Lymphocytes Relative: 18 %
Lymphs Abs: 1.9 10*3/uL (ref 0.7–4.0)
MCH: 29.7 pg (ref 26.0–34.0)
MCHC: 31.8 g/dL (ref 30.0–36.0)
MCV: 93.4 fL (ref 80.0–100.0)
Monocytes Absolute: 0.8 10*3/uL (ref 0.1–1.0)
Monocytes Relative: 8 %
Neutro Abs: 7.3 10*3/uL (ref 1.7–7.7)
Neutrophils Relative %: 71 %
Platelets: 261 10*3/uL (ref 150–400)
RBC: 5.12 MIL/uL — ABNORMAL HIGH (ref 3.87–5.11)
RDW: 13.1 % (ref 11.5–15.5)
WBC: 10.2 10*3/uL (ref 4.0–10.5)
nRBC: 0 % (ref 0.0–0.2)

## 2019-01-01 LAB — BASIC METABOLIC PANEL
Anion gap: 9 (ref 5–15)
BUN: 15 mg/dL (ref 6–20)
CO2: 25 mmol/L (ref 22–32)
Calcium: 8.8 mg/dL — ABNORMAL LOW (ref 8.9–10.3)
Chloride: 104 mmol/L (ref 98–111)
Creatinine, Ser: 0.63 mg/dL (ref 0.44–1.00)
GFR calc Af Amer: >60 mL/min (ref >60)
GFR calc non Af Amer: >60 mL/min (ref >60)
Glucose, Bld: 112 mg/dL — ABNORMAL HIGH (ref 70–99)
Potassium: 4.2 mmol/L (ref 3.5–5.1)
Sodium: 138 mmol/L (ref 135–145)

## 2019-01-01 LAB — GROUP A STREP BY PCR: Group A Strep by PCR: NOT DETECTED

## 2019-01-01 MED ORDER — PROCHLORPERAZINE EDISYLATE 10 MG/2ML IJ SOLN
5.0000 mg | Freq: Once | INTRAMUSCULAR | Status: AC
  Administered 2019-01-01: 5 mg via INTRAVENOUS
  Filled 2019-01-01: qty 2

## 2019-01-01 MED ORDER — FENTANYL CITRATE (PF) 100 MCG/2ML IJ SOLN
50.0000 ug | Freq: Once | INTRAMUSCULAR | Status: AC
  Administered 2019-01-01: 50 ug via INTRAVENOUS
  Filled 2019-01-01: qty 2

## 2019-01-01 MED ORDER — IOHEXOL 300 MG/ML  SOLN
75.0000 mL | Freq: Once | INTRAMUSCULAR | Status: AC | PRN
  Administered 2019-01-01: 75 mL via INTRAVENOUS

## 2019-01-01 NOTE — Discharge Instructions (Addendum)
Your strep test is negative.  Your blood work is nonacute at this time.  A CT scan of your soft tissues of the neck is a negative for abscess or other acute problems.  Please see Dr.Teoh in the office as soon as possible for follow-up and recheck of your pain.

## 2019-01-01 NOTE — ED Provider Notes (Signed)
[facility] EMERGENCY DEPARTMENT Provider Note   CSN: [id number] Arrival date & time: 01/01/19  1125     History   Chief Complaint Chief Complaint  Patient presents with  . Sore Throat    HPI [name] is a 58 y.o. female.     Patient is a 58 year old female who presents to the emergency department with a sore throat and neck pain.  The patient was diagnosed on July 3 with submandibular gland swelling and hypokalemia.  She was treated with antibiotics, and was told to see ear nose and throat.  The ear nose and throat specialist that she had an appointment with had to cancel because of a COVID outbreak in their office, and the patient has not had follow-up since the event of July 3.  Approximately 5 days ago the patient began to have problems with sore throat and neck pain.  The neck pain is now becoming nearly unbearable.  The patient states that she can drink liquids if she sips, but cannot swallow food because of the pain.  No fever reported, patient states she generally does not feel well, but no other issues reported.  The history is provided by the patient.  Sore Throat This is a new problem. The current episode started more than 2 days ago. The problem occurs daily. The problem has been gradually worsening. Associated symptoms include headaches. Pertinent negatives include no chest pain, no abdominal pain and no shortness of breath. The symptoms are aggravated by swallowing. Nothing relieves the symptoms. She has tried acetaminophen for the symptoms. The treatment provided mild relief.    Past Medical History:  Diagnosis Date  . Anxiety   . Arthritis   . Constipation   . COPD (chronic obstructive pulmonary disease) ([place])   . Family history of adverse reaction to anesthesia    brother was "allergic" to anesthesia - heart stopped beating on the surgery table  . Gallbladder sludge   . GERD (gastroesophageal reflux disease)   . Headache    used to have migraines  .  Hypertension   . Palpitations   . Staph infection     Patient Active Problem List   Diagnosis Date Noted  . Submandibular gland swelling 09/28/2018  . Hypokalemia 09/28/2018  . Tobacco abuse 09/28/2018  . Essential hypertension 09/28/2018  . Hyperglycemia 09/28/2018  . Sialadenitis   . Cholecystitis 09/11/2017  . Proximal phalanx fracture of finger 03/31/2015    Past Surgical History:  Procedure Laterality Date  . CHOLECYSTECTOMY N/A 09/13/2017   Procedure: LAPAROSCOPIC CHOLECYSTECTOMY;  Surgeon: Virl Cagey, MD;  Location: [facility];  Service: General;  Laterality: N/A;  . GROIN DEBRIDEMENT     due to spider bite,   . OPEN REDUCTION INTERNAL FIXATION (ORIF) PROXIMAL PHALANX Right 04/07/2015   Procedure: OPEN REDUCTION INTERNAL FIXATION (ORIF) PROXIMAL PHALANX;  Surgeon: Meredith Pel, MD;  Location: [facility];  Service: Orthopedics;  Laterality: Right;  . TONSILLECTOMY    . TUBAL LIGATION       OB History    Gravida      Para      Term      Preterm      AB      Living  3     SAB      TAB      Ectopic      Multiple      Live Births               Home  Medications    Prior to Admission medications   Medication Sig Start Date End Date Taking? Authorizing Provider  albuterol (PROVENTIL) (2.5 MG/3ML) 0.083% nebulizer solution Take 3 mLs (2.5 mg total) by nebulization every 6 (six) hours as needed for wheezing or shortness of breath. 06/15/18   Elson Areas, PA-C  amLODipine (NORVASC) 10 MG tablet Take 1 tablet (10 mg total) by mouth daily. 09/29/18 10/29/18  Glade Lloyd, MD  aspirin EC 81 MG tablet Take 81 mg by mouth daily.    [provider]  hydrochlorothiazide (HYDRODIURIL) 25 MG tablet Take 1 tablet (25 mg total) by mouth daily. 09/29/18   Glade Lloyd, MD  Melatonin 10 MG TABS Take 10 mg by mouth at bedtime as needed (sleep).     [provider]  thiamine (VITAMIN B-1) 100 MG tablet Take 100 mg by mouth daily.    [provider]  traMADol (ULTRAM) 50 MG tablet Take 1 tablet (50 mg total) by mouth every 12 (twelve) hours as needed. 10/22/18   Rennis Harding, PA-C    Family History Family History  Problem Relation Age of Onset  . COPD Mother   . Diabetes Father     Social History Social History   Tobacco Use  . Smoking status: Current Some Day Smoker    Packs/day: 0.25    Types: Cigarettes  . Smokeless tobacco: Never Used  Substance Use Topics  . Alcohol use: Yes    Comment: wine occ  . Drug use: No     Allergies   Penicillins   Review of Systems Review of Systems  Constitutional: Positive for activity change. Negative for appetite change.  HENT: Positive for sore throat. Negative for congestion, ear discharge, ear pain, facial swelling, nosebleeds, rhinorrhea, sneezing and tinnitus.   Eyes: Negative for photophobia, pain and discharge.  Respiratory: Negative for cough, choking, shortness of breath and wheezing.   Cardiovascular: Negative for chest pain, palpitations and leg swelling.  Gastrointestinal: Negative for abdominal pain, blood in stool, constipation, diarrhea, nausea and vomiting.  Genitourinary: Negative for difficulty urinating, dysuria, flank pain, frequency and hematuria.  Musculoskeletal: Positive for neck pain. Negative for back pain, gait problem and myalgias.  Skin: Negative for color change, rash and wound.  Neurological: Positive for headaches. Negative for dizziness, seizures, syncope, facial asymmetry, speech difficulty, weakness and numbness.  Hematological: Negative for adenopathy. Does not bruise/bleed easily.  Psychiatric/Behavioral: Negative for agitation, confusion, hallucinations, self-injury and suicidal ideas. The patient is not nervous/anxious.      Physical Exam Updated Vital Signs BP (!) 151/107 (BP Location: Right Arm)   Pulse 77   Temp 98.1 F (36.7 C) (Oral)   Resp 18   Ht 5' (1.524 m)   Wt 65.8 kg   SpO2 96%   BMI 28.32 kg/m    Physical Exam Vitals signs and nursing note reviewed.  Constitutional:      Appearance: She is well-developed. She is not toxic-appearing.  HENT:     Head: Normocephalic.     Comments: The airway is patent.  There is no significant erythema or exudate of the posterior pharynx.  The uvula is in the midline.  The speech is understandable.    Right Ear: Tympanic membrane and external ear normal.     Left Ear: Tympanic membrane and external ear normal.  Eyes:     General: Lids are normal.     Pupils: Pupils are equal, round, and reactive to light.  Neck:  Musculoskeletal: Neck supple. Decreased range of motion. Pain with movement present. No edema, erythema or neck rigidity.     Vascular: No carotid bruit.     Trachea: Trachea normal.   Cardiovascular:     Rate and Rhythm: Normal rate and regular rhythm.     Pulses: Normal pulses.     Heart sounds: Normal heart sounds.  Pulmonary:     Effort: No respiratory distress.     Breath sounds: Normal breath sounds.  Abdominal:     General: Bowel sounds are normal.     Palpations: Abdomen is soft.     Tenderness: There is no abdominal tenderness. There is no guarding.  Lymphadenopathy:     Head:     Right side of head: No submandibular adenopathy.     Left side of head: No submandibular adenopathy.     Cervical: No cervical adenopathy.  Skin:    General: Skin is warm and dry.  Neurological:     Mental Status: She is alert and oriented to person, place, and time.     Cranial Nerves: No cranial nerve deficit.     Sensory: No sensory deficit.  Psychiatric:        Speech: Speech normal.      ED Treatments / Results  Labs (all labs ordered are listed, but only abnormal results are displayed) Labs Reviewed  GROUP A STREP BY PCR    EKG None  Radiology No results found.  Procedures Procedures (including critical care time)  Medications Ordered in ED Medications - No data to display   Initial Impression / Assessment and  Plan / ED Course  I have reviewed the triage vital signs and the nursing notes.  Pertinent labs & imaging results that were available during my care of the patient were reviewed by me and considered in my medical decision making (see chart for details).          Final Clinical Impressions(s) / ED Diagnoses MdM  Blood pressure is elevated, otherwise vital signs within normal limits.  Pulse oximetry is 96% on room air.  Within normal limits by my interpretation.  The patient speaks in complete sentences and the speech is clear.  Patient has pain with movement, pain to palpation of the neck.  The airway is patent.  There is no exudate noted on the posterior pharynx.  Complete blood count is nonacute.  Temperature is within normal limits.  The basic metabolic panel is within normal limits.  The anion gap is normal at 9.  Strep screen is negative.  Strep screen negative, complete blood count within normal limits, no temperature elevation, and no pulse elevation, no major changes in blood pressure.  Doubt major infective process at this time.  Will obtain a CT scan of the neck and compare it to the previous study in July to gain additional information concerning the severe pain that this patient is having.  CT soft tissue neck shows no acute abnormality.  His negative for pharyngeal edema or peritonsillar abscess.  Normal salivary glands at this time.  Patient states she is feeling some better after pain medication here in the department.  I have discussed the laboratory findings as well as the CT findings and the exam findings with the patient in terms of which he understands.  I have asked her to discussed the symptoms and the trail of events with Dr.Teoh -ENT.  I have asked the patient to return to the emergency department if there are any further  difficulties with breathing, difficulties with swallowing or speaking, changes in condition, problems or concerns.   Final diagnoses:  Pain in  throat    ED Discharge Orders    None       Ivery Quale, PA-C 01/03/19 0754    Samuel Jester, DO 01/04/19 [phone]

## 2019-01-01 NOTE — ED Triage Notes (Signed)
Pt c/o of sore throat x 5 days. Unable to swallow food but can sip liquids

## 2019-01-03 ENCOUNTER — Ambulatory Visit (INDEPENDENT_AMBULATORY_CARE_PROVIDER_SITE_OTHER): Payer: Self-pay | Admitting: Otolaryngology

## 2019-01-31 ENCOUNTER — Ambulatory Visit (INDEPENDENT_AMBULATORY_CARE_PROVIDER_SITE_OTHER): Payer: Self-pay | Admitting: Otolaryngology

## 2019-06-20 ENCOUNTER — Ambulatory Visit: Payer: Self-pay

## 2019-07-22 ENCOUNTER — Ambulatory Visit: Payer: HRSA Program | Attending: Internal Medicine

## 2019-07-22 ENCOUNTER — Telehealth: Payer: Self-pay

## 2019-07-22 ENCOUNTER — Other Ambulatory Visit: Payer: Self-pay

## 2019-07-22 DIAGNOSIS — Z20822 Contact with and (suspected) exposure to covid-19: Secondary | ICD-10-CM | POA: Diagnosis present

## 2019-07-22 NOTE — Telephone Encounter (Signed)
Patient called stating that she and her husband had been exposed to COVID-19. She states that it has been 5 days ago.  She is now having a cough. Information on scheduling for COVID-19 testing was given to patient.  She was told to treat any symptom with any OTC medication she would normally use. She verbalized understanding and will schedule appointment.

## 2019-07-23 ENCOUNTER — Ambulatory Visit: Payer: Self-pay | Attending: Internal Medicine

## 2019-07-23 DIAGNOSIS — Z23 Encounter for immunization: Secondary | ICD-10-CM

## 2019-07-23 LAB — SARS-COV-2, NAA 2 DAY TAT

## 2019-07-23 LAB — NOVEL CORONAVIRUS, NAA: SARS-CoV-2, NAA: NOT DETECTED

## 2019-07-23 NOTE — Progress Notes (Signed)
   Covid-19 Vaccination Clinic  Name:  [name]    MRN: [id number] DOB: 11-30-1960  07/23/2019  [name] was observed post Covid-19 immunization for 15 minutes without incident. She was provided with Vaccine Information Sheet and instruction to access the V-Safe system.   [name] was instructed to call 911 with any severe reactions post vaccine: Marland Kitchen Difficulty breathing  . Swelling of face and throat  . A fast heartbeat  . A bad rash all over body  . Dizziness and weakness   Immunizations Administered    Name Date Dose VIS Date Route   Moderna COVID-19 Vaccine 07/23/2019  9:48 AM 0.5 mL 02/2019 Intramuscular   Manufacturer: Moderna   Lot: [id number]   NDC: 24497-530-05

## 2019-08-22 ENCOUNTER — Ambulatory Visit: Payer: Self-pay | Attending: Internal Medicine

## 2019-08-22 ENCOUNTER — Other Ambulatory Visit: Payer: Self-pay

## 2019-08-22 DIAGNOSIS — Z23 Encounter for immunization: Secondary | ICD-10-CM

## 2019-08-22 NOTE — Progress Notes (Signed)
   Covid-19 Vaccination Clinic  Name:  [name]    MRN: [id number] DOB: Sep 18, 1960  08/22/2019  [name] was observed post Covid-19 immunization for 15 minutes without incident. She was provided with Vaccine Information Sheet and instruction to access the V-Safe system.   [name] was instructed to call 911 with any severe reactions post vaccine: Marland Kitchen Difficulty breathing  . Swelling of face and throat  . A fast heartbeat  . A bad rash all over body  . Dizziness and weakness   Immunizations Administered    Name Date Dose VIS Date Route   Moderna COVID-19 Vaccine 08/22/2019  9:17 AM 0.5 mL 02/2019 Intramuscular   Manufacturer: Moderna   Lot: [id number]   NDC: 16967-893-81

## 2019-10-23 ENCOUNTER — Ambulatory Visit: Admission: EM | Admit: 2019-10-23 | Discharge: 2019-10-23 | Discharge status: Absent without leave | Payer: Self-pay

## 2020-09-12 ENCOUNTER — Other Ambulatory Visit: Payer: Self-pay

## 2020-09-12 ENCOUNTER — Emergency Department (HOSPITAL_COMMUNITY): Admission: EM | Admit: 2020-09-12 | Discharge: 2020-09-12 | Discharge status: Against Medical Advice | Payer: 59

## 2020-09-14 ENCOUNTER — Ambulatory Visit
Admission: EM | Admit: 2020-09-14 | Discharge: 2020-09-14 | Disposition: A | Discharge status: Home / Self Care | Payer: 59 | Attending: Family Medicine | Admitting: Family Medicine

## 2020-09-14 ENCOUNTER — Ambulatory Visit (INDEPENDENT_AMBULATORY_CARE_PROVIDER_SITE_OTHER): Payer: 59

## 2020-09-14 ENCOUNTER — Other Ambulatory Visit: Payer: Self-pay

## 2020-09-14 ENCOUNTER — Encounter (HOSPITAL_COMMUNITY): Payer: Self-pay

## 2020-09-14 DIAGNOSIS — W19XXXA Unspecified fall, initial encounter: Secondary | ICD-10-CM

## 2020-09-14 DIAGNOSIS — R52 Pain, unspecified: Secondary | ICD-10-CM | POA: Diagnosis not present

## 2020-09-14 DIAGNOSIS — M533 Sacrococcygeal disorders, not elsewhere classified: Secondary | ICD-10-CM

## 2020-09-14 DIAGNOSIS — I10 Essential (primary) hypertension: Secondary | ICD-10-CM

## 2020-09-14 DIAGNOSIS — M545 Low back pain, unspecified: Secondary | ICD-10-CM

## 2020-09-14 MED ORDER — KETOROLAC TROMETHAMINE 30 MG/ML IJ SOLN
30.0000 mg | Freq: Once | INTRAMUSCULAR | Status: AC
  Administered 2020-09-14: 30 mg via INTRAMUSCULAR

## 2020-09-14 MED ORDER — AMLODIPINE BESYLATE 10 MG PO TABS: 10.0000 mg | ORAL_TABLET | Freq: Every day | ORAL | 1 refills | Status: DC

## 2020-09-14 MED ORDER — HYDROCODONE-ACETAMINOPHEN 5-325 MG PO TABS: 1.0000 | ORAL_TABLET | Freq: Four times a day (QID) | ORAL | 0 refills | Status: DC | PRN

## 2020-09-14 MED ORDER — HYDROCHLOROTHIAZIDE 25 MG PO TABS: 25.0000 mg | ORAL_TABLET | Freq: Every day | ORAL | 1 refills | Status: DC

## 2020-09-14 MED ORDER — MELOXICAM 15 MG PO TABS: 15.0000 mg | ORAL_TABLET | Freq: Every day | ORAL | 0 refills | Status: DC

## 2020-09-14 NOTE — Discharge Instructions (Addendum)
Be aware, you have been prescribed pain medications that may cause drowsiness. While taking this medication, do not take any other medications containing acetaminophen (Tylenol). Do not combine with alcohol or other illicit drugs. Please do not drive, operate heavy machinery, or take part in activities that require making important decisions while on this medication as your judgement may be clouded.  Your blood pressure was noted to be elevated during your visit today. If you are currently taking medication for high blood pressure, please ensure you are taking this as directed. If you do not have a history of high blood pressure and your blood pressure remains persistently elevated, you may need to begin taking a medication at some point. You may return here within the next few days to recheck if unable to see your primary care provider or if you do not have a one.  BP (!) 172/107   Pulse 77   Temp 98.6 F (37 C)   Resp (!) 22   SpO2 94%   BP Readings from Last 3 Encounters:  09/14/20 (!) 172/107  01/01/19 (!) 148/80  10/22/18 (!) 149/80

## 2020-09-14 NOTE — ED Triage Notes (Signed)
Pt fell over furniture and hit back and tail bone, large bruise to right flank

## 2020-09-14 NOTE — ED Provider Notes (Signed)
[facility]   [id number] 09/14/20 Arrival Time: 7591  ASSESSMENT & PLAN:  1. Coccyx pain   2. Acute bilateral low back pain without sciatica   3. Elevated blood pressure reading with diagnosis of hypertension    I have personally viewed the imaging studies ordered this visit. Films of lumbar spine and sacrum/coccyx with appreciated fractures.  Able to ambulate here and hemodynamically stable. No indication for imaging of back at this time given no trauma and normal neurological exam. Discussed.  Meds ordered this encounter  Medications   ketorolac (TORADOL) 30 MG/ML injection 30 mg   amLODipine (NORVASC) 10 MG tablet    Sig: Take 1 tablet (10 mg total) by mouth daily.    Dispense:  30 tablet    Refill:  1   hydrochlorothiazide (HYDRODIURIL) 25 MG tablet    Sig: Take 1 tablet (25 mg total) by mouth daily.    Dispense:  30 tablet    Refill:  1   HYDROcodone-acetaminophen (NORCO/VICODIN) 5-325 MG tablet    Sig: Take 1 tablet by mouth every 6 (six) hours as needed for moderate pain or severe pain.    Dispense:  12 tablet    Refill:  0   meloxicam (MOBIC) 15 MG tablet    Sig: Take 1 tablet (15 mg total) by mouth daily.    Dispense:  20 tablet    Refill:  0    Medication sedation precautions given. Encourage ROM/movement as tolerated.  [place] Controlled Substances Registry consulted for this patient. I feel the risk/benefit ratio today is favorable for proceeding with this prescription for a controlled substance. Medication sedation precautions given.  Patient placed in PCP assistance queue. No s/s of HTN urgency.    Discharge Instructions      Be aware, you have been prescribed pain medications that may cause drowsiness. While taking this medication, do not take any other medications containing acetaminophen (Tylenol). Do not combine with alcohol or other illicit drugs. Please do not drive, operate heavy machinery, or take part in activities that require making  important decisions while on this medication as your judgement may be clouded.  Your blood pressure was noted to be elevated during your visit today. If you are currently taking medication for high blood pressure, please ensure you are taking this as directed. If you do not have a history of high blood pressure and your blood pressure remains persistently elevated, you may need to begin taking a medication at some point. You may return here within the next few days to recheck if unable to see your primary care provider or if you do not have a one.  BP (!) 172/107   Pulse 77   Temp 98.6 F (37 C)   Resp (!) 22   SpO2 94%   BP Readings from Last 3 Encounters:  09/14/20 (!) 172/107  01/01/19 (!) 148/80  10/22/18 (!) 149/80         Reviewed expectations re: course of current medical issues. Questions answered. Outlined signs and symptoms indicating need for more acute intervention. Patient verbalized understanding. After Visit Summary given.   SUBJECTIVE: History from: patient.  [name] is a 60 y.o. female who presents with complaint of persistent bilateral lower back discomfort. Onset abrupt. First noted  3 d ago . Injury/trama: reports fall on buttock. History of back problems requiring medical care: no. Pain described as aching and without radiation. Aggravating factors: certain movements and prolonged walking/standing. Alleviating factors: have not been identified. Progressive  LE weakness or saddle anesthesia: none. Extremity sensation changes or weakness: none. Ambulatory without difficulty. Normal bowel/bladder habits: yes; without urinary retention. Normal PO intake without n/v. No associated abdominal pain/n/v. Self treatment: has  OTC analgesics without much relief; pain affects sleep .  Reports no chronic steroid use, fevers, IV drug use, or recent back surgeries or procedures.  Increased blood pressure noted today. Reports that she has not been treated for  hypertension in the past. She reports not taking medications regularly as instructed, no chest pain on exertion, no dyspnea on exertion, no swelling of ankles, no orthostatic dizziness or lightheadedness, no orthopnea or paroxysmal nocturnal dyspnea, and no palpitations.   OBJECTIVE:  Vitals:   09/14/20 1010 09/14/20 1059  BP: (!) 231/141 (!) 172/107  Pulse: 77   Resp: (!) 22   Temp: 98.6 F (37 C)   SpO2: 94%     Recheck RR 16 General appearance: alert; no distress HEENT: [place]; AT Neck: supple with FROM; without midline tenderness CV: regular Lungs: unlabored respirations; speaks full sentences without difficulty Abdomen: soft, non-tender; non-distended Back: moderate and poorly localized tenderness to palpation over lumbar spine extending toward coccyx ; FROM at waist; bruising: none; with midline tenderness Extremities: without edema; symmetrical without gross deformities; normal ROM of bilateral LE Skin: warm and dry Neurologic: normal gait; normal sensation and strength of bilateral LE Psychological: alert and cooperative; normal mood and affect  Imaging: DG Lumbar Spine Complete  Result Date: 09/14/2020 CLINICAL DATA:  Low back pain following fall today. Initial encounter. EXAM: LUMBAR SPINE - COMPLETE 4+ VIEW COMPARISON:  10/22/2018 radiographs FINDINGS: There is no evidence of acute fracture. 3 mm retrolisthesis of L3 on L4 is unchanged. Multilevel degenerative disc disease/spondylosis noted, moderate to severe at L3-4 and L5-S1. Mild facet arthropathy within the LOWER lumbar spine identified. No focal bony lesions are present. Aortic atherosclerotic calcifications are again identified. IMPRESSION: 1. No evidence of acute abnormality. 2. Multilevel degenerative changes, moderate to severe at L3-4 and L5-S1. 3.  Aortic Atherosclerosis (ICD10-I70.0). Electronically Signed   By: Harmon Pier M.D.   On: 09/14/2020 10:57   DG Sacrum/Coccyx  Result Date: 09/14/2020 CLINICAL DATA:   Larey Seat.  Sacral/coccyx pain. EXAM: SACRUM AND COCCYX - 2+ VIEW COMPARISON:  Lumbar spine radiographs 10/22/2018. FINDINGS: Both hips are normally located. No significant degenerative changes or acute bony abnormality. No evidence of AVN. The pubic symphysis and SI joints are intact. No pelvic fractures are identified. No definite sacral or coccyx fractures. Age advanced degenerative changes involving the lower lumbar spine but no acute fracture. IMPRESSION: 1. No acute bony findings. 2. Age advanced degenerative changes involving the lower lumbar spine. Electronically Signed   By: Rudie Meyer M.D.   On: 09/14/2020 10:53    Allergies  Allergen Reactions   Penicillins Swelling    Throat swelling Has patient had a PCN reaction causing immediate rash, facial/tongue/throat swelling, SOB or lightheadedness with hypotension: Yes Has patient had a PCN reaction causing severe rash involving mucus membranes or skin necrosis: No Has patient had a PCN reaction that required hospitalization  ED visit but no hospitalization Has patient had a PCN reaction occurring within the last 10 years: No If all of the above answers are "NO", then may proceed with Cephalosporin use.    Past Medical History:  Diagnosis Date   Anxiety    Arthritis    Constipation    COPD (chronic obstructive pulmonary disease) (HCC)    Family history of adverse reaction to  anesthesia    brother was "allergic" to anesthesia - heart stopped beating on the surgery table   Gallbladder sludge    GERD (gastroesophageal reflux disease)    Headache    used to have migraines   Hypertension    Palpitations    Staph infection    Social History   Socioeconomic History   Marital status: Married    Spouse name: Not on file   Number of children: Not on file   Years of education: Not on file   Highest education level: Not on file  Occupational History   Not on file  Tobacco Use   Smoking status: Some Days    Packs/day: 0.25    Pack  years: 0.00    Types: Cigarettes   Smokeless tobacco: Never  Substance and Sexual Activity   Alcohol use: Yes    Comment: wine occ   Drug use: No   Sexual activity: Not on file  Other Topics Concern   Not on file  Social History Narrative   Not on file   Social Determinants of Health   Financial Resource Strain: Not on file  Food Insecurity: Not on file  Transportation Needs: Not on file  Physical Activity: Not on file  Stress: Not on file  Social Connections: Not on file  Intimate Partner Violence: Not on file   Family History  Problem Relation Age of Onset   COPD Mother    Diabetes Father    Past Surgical History:  Procedure Laterality Date   CHOLECYSTECTOMY N/A 09/13/2017   Procedure: LAPAROSCOPIC CHOLECYSTECTOMY;  Surgeon: Lucretia Roers, MD;  Location: [facility];  Service: General;  Laterality: N/A;   GROIN DEBRIDEMENT     due to spider bite,    OPEN REDUCTION INTERNAL FIXATION (ORIF) PROXIMAL PHALANX Right 04/07/2015   Procedure: OPEN REDUCTION INTERNAL FIXATION (ORIF) PROXIMAL PHALANX;  Surgeon: Cammy Copa, MD;  Location: [facility];  Service: Orthopedics;  Laterality: Right;   TONSILLECTOMY     TUBAL LIGATION        Mardella Layman, MD 09/14/20 1120

## 2021-01-25 ENCOUNTER — Emergency Department (HOSPITAL_COMMUNITY): Admission: EM | Admit: 2021-01-25 | Discharge: 2021-01-25 | Discharge status: Against Medical Advice | Payer: 59

## 2021-01-25 ENCOUNTER — Other Ambulatory Visit: Payer: Self-pay

## 2021-01-25 ENCOUNTER — Ambulatory Visit
Admission: EM | Admit: 2021-01-25 | Discharge: 2021-01-25 | Disposition: A | Discharge status: Home / Self Care | Payer: 59

## 2021-01-25 ENCOUNTER — Emergency Department (HOSPITAL_COMMUNITY)
Admission: EM | Admit: 2021-01-25 | Discharge: 2021-01-25 | Disposition: A | Discharge status: Home / Self Care | Payer: 59

## 2021-07-13 ENCOUNTER — Emergency Department (HOSPITAL_COMMUNITY): Payer: 59

## 2021-07-13 ENCOUNTER — Emergency Department (HOSPITAL_COMMUNITY)
Admission: EM | Admit: 2021-07-13 | Discharge: 2021-07-13 | Disposition: A | Discharge status: Home / Self Care | Payer: 59 | Attending: Emergency Medicine | Admitting: Emergency Medicine

## 2021-07-13 ENCOUNTER — Encounter (HOSPITAL_COMMUNITY): Payer: Self-pay | Admitting: Emergency Medicine

## 2021-07-13 ENCOUNTER — Other Ambulatory Visit: Payer: Self-pay

## 2021-07-13 DIAGNOSIS — Z79899 Other long term (current) drug therapy: Secondary | ICD-10-CM | POA: Diagnosis not present

## 2021-07-13 DIAGNOSIS — F10288 Alcohol dependence with other alcohol-induced disorder: Secondary | ICD-10-CM | POA: Diagnosis not present

## 2021-07-13 DIAGNOSIS — J439 Emphysema, unspecified: Secondary | ICD-10-CM | POA: Diagnosis not present

## 2021-07-13 DIAGNOSIS — F1010 Alcohol abuse, uncomplicated: Secondary | ICD-10-CM

## 2021-07-13 DIAGNOSIS — R599 Enlarged lymph nodes, unspecified: Secondary | ICD-10-CM | POA: Insufficient documentation

## 2021-07-13 DIAGNOSIS — F419 Anxiety disorder, unspecified: Secondary | ICD-10-CM | POA: Diagnosis present

## 2021-07-13 DIAGNOSIS — Y906 Blood alcohol level of 120-199 mg/100 ml: Secondary | ICD-10-CM | POA: Diagnosis not present

## 2021-07-13 LAB — CBC WITH DIFFERENTIAL/PLATELET
Abs Immature Granulocytes: 0.04 10*3/uL (ref 0.00–0.07)
Basophils Absolute: 0 10*3/uL (ref 0.0–0.1)
Basophils Relative: 0 %
Eosinophils Absolute: 0.1 10*3/uL (ref 0.0–0.5)
Eosinophils Relative: 1 %
HCT: 48.7 % — ABNORMAL HIGH (ref 36.0–46.0)
Hemoglobin: 15.9 g/dL — ABNORMAL HIGH (ref 12.0–15.0)
Immature Granulocytes: 0 %
Lymphocytes Relative: 42 %
Lymphs Abs: 3.8 10*3/uL (ref 0.7–4.0)
MCH: 29.7 pg (ref 26.0–34.0)
MCHC: 32.6 g/dL (ref 30.0–36.0)
MCV: 90.9 fL (ref 80.0–100.0)
Monocytes Absolute: 0.6 10*3/uL (ref 0.1–1.0)
Monocytes Relative: 7 %
Neutro Abs: 4.4 10*3/uL (ref 1.7–7.7)
Neutrophils Relative %: 50 %
Platelets: 214 10*3/uL (ref 150–400)
RBC: 5.36 MIL/uL — ABNORMAL HIGH (ref 3.87–5.11)
RDW: 13.1 % (ref 11.5–15.5)
WBC: 8.9 10*3/uL (ref 4.0–10.5)
nRBC: 0 % (ref 0.0–0.2)

## 2021-07-13 LAB — COMPREHENSIVE METABOLIC PANEL
ALT: 30 U/L (ref 0–44)
AST: 32 U/L (ref 15–41)
Albumin: 3.8 g/dL (ref 3.5–5.0)
Alkaline Phosphatase: 77 U/L (ref 38–126)
Anion gap: 10 (ref 5–15)
BUN: 15 mg/dL (ref 6–20)
CO2: 25 mmol/L (ref 22–32)
Calcium: 9.2 mg/dL (ref 8.9–10.3)
Chloride: 106 mmol/L (ref 98–111)
Creatinine, Ser: 0.7 mg/dL (ref 0.44–1.00)
GFR, Estimated: >60 mL/min (ref >60)
Glucose, Bld: 124 mg/dL — ABNORMAL HIGH (ref 70–99)
Potassium: 3.2 mmol/L — ABNORMAL LOW (ref 3.5–5.1)
Sodium: 141 mmol/L (ref 135–145)
Total Bilirubin: 0.4 mg/dL (ref 0.3–1.2)
Total Protein: 7.8 g/dL (ref 6.5–8.1)

## 2021-07-13 LAB — ETHANOL: Alcohol, Ethyl (B): 172 mg/dL — ABNORMAL HIGH (ref <10)

## 2021-07-13 MED ORDER — HYDROCHLOROTHIAZIDE 25 MG PO TABS: 25.0000 mg | ORAL_TABLET | Freq: Every day | ORAL | 1 refills | Status: DC

## 2021-07-13 MED ORDER — SODIUM CHLORIDE 0.9 % IV BOLUS
1000.0000 mL | Freq: Once | INTRAVENOUS | Status: AC
  Administered 2021-07-13: 1000 mL via INTRAVENOUS

## 2021-07-13 NOTE — ED Notes (Signed)
Pt  O2 dropped to 78% ( Pt sleeping, had to shake to arouse) ?Pt. placed on 4 L O2.  ?O2 up to 90% ?

## 2021-07-13 NOTE — Discharge Instructions (Signed)
Make an appointment to follow-up with Dr. Ellin Saba for your lymph nodes ?

## 2021-07-13 NOTE — ED Triage Notes (Signed)
Per husband pt is altered. She has been drinking alcohol and states all her lymph nodes are swollen. Pt is crying in triage stating she wants to leave.  ?

## 2021-07-13 NOTE — ED Provider Notes (Signed)
?[facility]PART[facility] ?Provider Note ? ? ?CSN: [id number] ?Arrival date & time: 07/13/21  1952 ? ?  ? ?History ? ?Chief Complaint  ?Patient presents with  ? Anxiety  ? ? ?[name] is a 61 y.o. female. ? ?Patient complains of swollen lymph nodes.  And weakness.  Patient has a history of EtOH abuse ? ?The history is provided by the patient and medical records. No language interpreter was used.  ?Anxiety ?This is a recurrent problem. The current episode started 12 to 24 hours ago. The problem occurs constantly. The problem has not changed since onset.Pertinent negatives include no chest pain, no abdominal pain and no headaches. Nothing aggravates the symptoms. Nothing relieves the symptoms. She has tried nothing for the symptoms.  ? ?  ? ?Home Medications ?Prior to Admission medications   ?Medication Sig Start Date End Date Taking? Authorizing Provider  ?albuterol (PROVENTIL) (2.5 MG/3ML) 0.083% nebulizer solution Take 3 mLs (2.5 mg total) by nebulization every 6 (six) hours as needed for wheezing or shortness of breath. ?Patient not taking: Reported on 07/13/2021 06/15/18   Elson Areas, PA-C  ?amLODipine (NORVASC) 10 MG tablet Take 1 tablet (10 mg total) by mouth daily. ?Patient not taking: Reported on 07/13/2021 09/14/20   Mardella Layman, MD  ?hydrochlorothiazide (HYDRODIURIL) 25 MG tablet Take 1 tablet (25 mg total) by mouth daily. ?Patient not taking: Reported on 07/13/2021 09/14/20   Mardella Layman, MD  ?HYDROcodone-acetaminophen (NORCO/VICODIN) 5-325 MG tablet Take 1 tablet by mouth every 6 (six) hours as needed for moderate pain or severe pain. ?Patient not taking: Reported on 07/13/2021 09/14/20   Mardella Layman, MD  ?meloxicam (MOBIC) 15 MG tablet Take 1 tablet (15 mg total) by mouth daily. ?Patient not taking: Reported on 07/13/2021 09/14/20   Mardella Layman, MD  ?   ? ?Allergies    ?Penicillins   ? ?Review of Systems   ?Review of Systems  ?Constitutional:  Negative for appetite change and fatigue.   ?HENT:  Negative for congestion, ear discharge and sinus pressure.   ?Eyes:  Negative for discharge.  ?Respiratory:  Negative for cough.   ?Cardiovascular:  Negative for chest pain.  ?Gastrointestinal:  Negative for abdominal pain and diarrhea.  ?Genitourinary:  Negative for frequency and hematuria.  ?Musculoskeletal:  Negative for back pain.  ?Skin:  Negative for rash.  ?     Patient concern to look closer swollen  ?Neurological:  Negative for seizures and headaches.  ?Psychiatric/Behavioral:  Positive for agitation. Negative for hallucinations.   ? ?Physical Exam ?Updated Vital Signs ?BP 119/71   Pulse 61   Temp 98.1 ?F (36.7 ?C)   Resp 20   Ht 5' (1.524 m)   Wt 68 kg   SpO2 94%   BMI 29.28 kg/m?  ?Physical Exam ?Vitals and nursing note reviewed.  ?Constitutional:   ?   Appearance: She is well-developed.  ?HENT:  ?   Head: Normocephalic.  ?   Nose: Nose normal.  ?Eyes:  ?   General: No scleral icterus. ?   Conjunctiva/sclera: Conjunctivae normal.  ?Neck:  ?   Thyroid: No thyromegaly.  ?Cardiovascular:  ?   Rate and Rhythm: Normal rate and regular rhythm.  ?   Heart sounds: No murmur heard. ?  No friction rub. No gallop.  ?Pulmonary:  ?   Breath sounds: No stridor. No wheezing or rales.  ?Chest:  ?   Chest wall: No tenderness.  ?Abdominal:  ?   General: There is no distension.  ?  Tenderness: There is no abdominal tenderness. There is no rebound.  ?Musculoskeletal:     ?   General: Normal range of motion.  ?   Cervical back: Neck supple.  ?Lymphadenopathy:  ?   Cervical: No cervical adenopathy.  ?Skin: ?   Findings: No erythema or rash.  ?Neurological:  ?   Mental Status: She is alert and oriented to person, place, and time.  ?   Motor: No abnormal muscle tone.  ?   Coordination: Coordination normal.  ?Psychiatric:  ?   Comments: Patient very anxious.  Not suicidal or homicidal  ? ? ?ED Results / Procedures / Treatments   ?Labs ?(all labs ordered are listed, but only abnormal results are displayed) ?Labs  Reviewed  ?ETHANOL - Abnormal; Notable for the following components:  ?    Result Value  ? Alcohol, Ethyl (B) 172 (*)   ? All other components within normal limits  ?CBC WITH DIFFERENTIAL/PLATELET - Abnormal; Notable for the following components:  ? RBC 5.36 (*)   ? Hemoglobin 15.9 (*)   ? HCT 48.7 (*)   ? All other components within normal limits  ?COMPREHENSIVE METABOLIC PANEL - Abnormal; Notable for the following components:  ? Potassium 3.2 (*)   ? Glucose, Bld 124 (*)   ? All other components within normal limits  ? ? ?EKG ?None ? ?Radiology ?DG Chest Port 1 View ? ?Result Date: 07/13/2021 ?CLINICAL DATA:  Short of breath, altered level of consciousness, hypoxia EXAM: PORTABLE CHEST 1 VIEW COMPARISON:  06/15/2018 FINDINGS: Single frontal view of the chest demonstrates mild enlargement the cardiac silhouette. Chronic interstitial scarring without airspace disease, effusion, or pneumothorax. No acute bony abnormalities. IMPRESSION: 1. Chronic scarring consistent with emphysema. 2. No acute airspace disease. Electronically Signed   By: Sharlet Salina M.D.   On: 07/13/2021 21:09   ? ?Procedures ?Procedures  ? ? ?Medications Ordered in ED ?Medications  ?sodium chloride 0.9 % bolus 1,000 mL (1,000 mLs Intravenous New Bag/Given 07/13/21 2144)  ? ? ?ED Course/ Medical Decision Making/ A&P ?Patient with EtOH abuse and complaint of swollen lymph nodes.  She will be discharged home referred to Dr. Ellin Saba ?                        ?Medical Decision Making ?Amount and/or Complexity of Data Reviewed ?Labs: ordered. ?Radiology: ordered. ? ?Risk ?Prescription drug management. ? ?This patient presents to the ED for concern of anxiety, this involves an extensive number of treatment options, and is a complaint that carries with it a high risk of complications and morbidity.  The differential diagnosis includes drug abuse, EtOH abuse ? ? ?Co morbidities that complicate the patient evaluation ? ?Alcohol abuse ? ? ?Additional  history obtained: ? ?Additional history obtained from friend ?External records from outside source obtained and reviewed including hospital record ? ? ?Lab Tests: ? ?I Ordered, and personally interpreted labs.  The pertinent results include: CBC chemistries and EtOH.  CBC normal, chemistries show potassium 3.2 and alcohol level 172 ? ? ?Imaging Studies ordered: ? ?I ordered imaging studies including chest x-ray ?I independently visualized and interpreted imaging which showed emphysema ?I agree with the radiologist interpretation ? ? ?Cardiac Monitoring: / EKG: ? ?The patient was maintained on a cardiac monitor.  I personally viewed and interpreted the cardiac monitored which showed an underlying rhythm of: Normal sinus rhythm ? ? ?Consultations Obtained: ? ?No consultant ? ?Problem List / ED Course / Critical interventions /  Medication management ? ?EtOH abuse anxiety ?I ordered medication including saline for dehydration ?Reevaluation of the patient after these medicines showed that the patient improved ?I have reviewed the patients home medicines and have made adjustments as needed ? ? ?Social Determinants of Health: ? ?Alcohol abuse ? ? ?Test / Admission - Considered: ? ?CT head ? ?EtOH abuse and complaint of swollen lymph nodes.  Patient is referred to a family doctor and an oncologist to look at her lymph nodes ? ? ? ? ? ? ? ?Final Clinical Impression(s) / ED Diagnoses ?Final diagnoses:  ?ETOH abuse  ? ? ?Rx / DC Orders ?ED Discharge Orders   ? ? None  ? ?  ? ? ?  ?Bethann BerkshireZammit, Melodee Lupe, MD ?07/14/21 1104 ? ?

## 2021-08-11 ENCOUNTER — Inpatient Hospital Stay (HOSPITAL_COMMUNITY): Payer: 59

## 2021-08-11 ENCOUNTER — Inpatient Hospital Stay (HOSPITAL_COMMUNITY): Payer: 59 | Attending: Hematology | Admitting: Hematology

## 2021-08-11 VITALS — BP 173/81 | HR 86 | Temp 97.6°F | Resp 20 | Ht 59.5 in | Wt 143.2 lb

## 2021-08-11 DIAGNOSIS — R591 Generalized enlarged lymph nodes: Secondary | ICD-10-CM | POA: Insufficient documentation

## 2021-08-11 DIAGNOSIS — Z9049 Acquired absence of other specified parts of digestive tract: Secondary | ICD-10-CM | POA: Insufficient documentation

## 2021-08-11 DIAGNOSIS — Z836 Family history of other diseases of the respiratory system: Secondary | ICD-10-CM | POA: Diagnosis not present

## 2021-08-11 DIAGNOSIS — Z833 Family history of diabetes mellitus: Secondary | ICD-10-CM | POA: Diagnosis not present

## 2021-08-11 DIAGNOSIS — Z79899 Other long term (current) drug therapy: Secondary | ICD-10-CM | POA: Diagnosis not present

## 2021-08-11 DIAGNOSIS — R0789 Other chest pain: Secondary | ICD-10-CM

## 2021-08-11 DIAGNOSIS — R110 Nausea: Secondary | ICD-10-CM | POA: Insufficient documentation

## 2021-08-11 DIAGNOSIS — M79606 Pain in leg, unspecified: Secondary | ICD-10-CM | POA: Insufficient documentation

## 2021-08-11 DIAGNOSIS — F17210 Nicotine dependence, cigarettes, uncomplicated: Secondary | ICD-10-CM | POA: Diagnosis not present

## 2021-08-11 DIAGNOSIS — Z880 Allergy status to penicillin: Secondary | ICD-10-CM | POA: Diagnosis not present

## 2021-08-11 DIAGNOSIS — Z122 Encounter for screening for malignant neoplasm of respiratory organs: Secondary | ICD-10-CM

## 2021-08-11 DIAGNOSIS — K921 Melena: Secondary | ICD-10-CM | POA: Diagnosis not present

## 2021-08-11 DIAGNOSIS — I10 Essential (primary) hypertension: Secondary | ICD-10-CM | POA: Diagnosis not present

## 2021-08-11 DIAGNOSIS — K5909 Other constipation: Secondary | ICD-10-CM | POA: Diagnosis not present

## 2021-08-11 DIAGNOSIS — R197 Diarrhea, unspecified: Secondary | ICD-10-CM | POA: Insufficient documentation

## 2021-08-11 LAB — D-DIMER, QUANTITATIVE: D-Dimer, Quant: 0.48 ug/mL-FEU (ref 0.00–0.50)

## 2021-08-11 LAB — LACTATE DEHYDROGENASE: LDH: 201 U/L — ABNORMAL HIGH (ref 98–192)

## 2021-08-11 NOTE — Progress Notes (Signed)
? ?[name] ?[address] ?[place], [place] [address] ? ? ?CLINIC:  ?Medical Oncology/Hematology ? ?CONSULT NOTE ? ?Patient Care Team: ?Patient, No Pcp Per (Inactive) as PCP - General (General Practice) ?Doreatha Massed, MD as Medical Oncologist (Medical Oncology) ?Therese Sarah, RN as Oncology Nurse Navigator (Medical Oncology) ? ?CHIEF COMPLAINTS/PURPOSE OF CONSULTATION:  ?Evaluation of lymphadenopathy ? ?HISTORY OF PRESENTING ILLNESS:  ?[name] 61 y.o. female is here because of evaluation of lymphadenopathy, at the request of the ED. ? ?Today she reports feeling good, and she is accompanied by her husband. She reports she presented to the ED on 4/18 due to SOB and a fall. She reports she has had multiple lumps appear on her chest over the past few months. She has not had a mammogram since 2015. She denies severe night sweats and evening fevers. She reports chronic constipation and intermittent diarrhea. She has never had a colonoscopy. She reports pain under her right breast which radiated to her back and started 2-3 weeks ago; she reports has had multiple falls, but she denies injury to that site. She reports pain upon deep inspiration. She reports a newly developed cough. She has developed nausea over the past 3-4 months. She denies history of DVT and PE. She reports leg pains. She reports occasional hematochezia with constipation. She has a history of HTN.  ? ?She does not currently work, but previously she worked as a Investment banker, operational on a food truck. She has a dog and a cat. She smokes 1/2 ppd and has smoked for 50 years; for 8-10 of those years she smoked 1 ppd. She denies chemical exposure. Her brother has head and neck cancer, and her mother passed from lung cancer.  ? ?MEDICAL HISTORY:  ?Past Medical History:  ?Diagnosis Date  ? Anxiety   ? Arthritis   ? Constipation   ? COPD (chronic obstructive pulmonary disease) (HCC)   ? Family history of adverse reaction to anesthesia   ?  brother was "allergic" to anesthesia - heart stopped beating on the surgery table  ? Gallbladder sludge   ? GERD (gastroesophageal reflux disease)   ? Headache   ? used to have migraines  ? Hypertension   ? Palpitations   ? Staph infection   ? ? ?SURGICAL HISTORY: ?Past Surgical History:  ?Procedure Laterality Date  ? CHOLECYSTECTOMY N/A 09/13/2017  ? Procedure: LAPAROSCOPIC CHOLECYSTECTOMY;  Surgeon: Lucretia Roers, MD;  Location: [facility];  Service: General;  Laterality: N/A;  ? GROIN DEBRIDEMENT    ? due to spider bite,   ? OPEN REDUCTION INTERNAL FIXATION (ORIF) PROXIMAL PHALANX Right 04/07/2015  ? Procedure: OPEN REDUCTION INTERNAL FIXATION (ORIF) PROXIMAL PHALANX;  Surgeon: Cammy Copa, MD;  Location: [facility];  Service: Orthopedics;  Laterality: Right;  ? TONSILLECTOMY    ? TUBAL LIGATION    ? ? ?SOCIAL HISTORY: ?Social History  ? ?Socioeconomic History  ? Marital status: Married  ?  Spouse name: Not on file  ? Number of children: Not on file  ? Years of education: Not on file  ? Highest education level: Not on file  ?Occupational History  ? Not on file  ?Tobacco Use  ? Smoking status: Some Days  ?  Packs/day: 0.25  ?  Types: Cigarettes  ? Smokeless tobacco: Never  ?Substance and Sexual Activity  ? Alcohol use: Yes  ?  Comment: wine occ  ? Drug use: No  ? Sexual activity: Not on file  ?Other Topics  Concern  ? Not on file  ?Social History Narrative  ? Not on file  ? ?Social Determinants of Health  ? ?Financial Resource Strain: Not on file  ?Food Insecurity: Not on file  ?Transportation Needs: Not on file  ?Physical Activity: Not on file  ?Stress: Not on file  ?Social Connections: Not on file  ?Intimate Partner Violence: Not on file  ? ? ?FAMILY HISTORY: ?Family History  ?Problem Relation Age of Onset  ? COPD Mother   ? Diabetes Father   ? ? ?ALLERGIES:  ?is allergic to penicillins. ? ?MEDICATIONS:  ?Current Outpatient Medications  ?Medication Sig Dispense Refill  ? albuterol (PROVENTIL) (2.5 MG/3ML) 0.083%  nebulizer solution Take 3 mLs (2.5 mg total) by nebulization every 6 (six) hours as needed for wheezing or shortness of breath. 75 mL 12  ? amLODipine (NORVASC) 10 MG tablet Take 1 tablet (10 mg total) by mouth daily. 30 tablet 1  ? hydrochlorothiazide (HYDRODIURIL) 25 MG tablet Take 1 tablet (25 mg total) by mouth daily. 30 tablet 1  ? HYDROcodone-acetaminophen (NORCO/VICODIN) 5-325 MG tablet Take 1 tablet by mouth every 6 (six) hours as needed for moderate pain or severe pain. 12 tablet 0  ? meloxicam (MOBIC) 15 MG tablet Take 1 tablet (15 mg total) by mouth daily. 20 tablet 0  ? ?No current facility-administered medications for this visit.  ? ? ?REVIEW OF SYSTEMS:   ?Review of Systems  ?Constitutional:  Positive for fatigue. Negative for appetite change and fever.  ?Respiratory:  Positive for cough and shortness of breath.   ?Cardiovascular:  Positive for chest pain (R under breast).  ?Gastrointestinal:  Positive for blood in stool (occasional), constipation, nausea and vomiting.  ?Endocrine: Negative for hot flashes.  ?Musculoskeletal:  Positive for arthralgias (6/10 L hands and hips).  ?Neurological:  Positive for numbness.  ?All other systems reviewed and are negative. ? ? ?PHYSICAL EXAMINATION: ?ECOG PERFORMANCE STATUS: 1 - Symptomatic but completely ambulatory ? ?Vitals:  ? 08/11/21 0836  ?BP: (!) 173/81  ?Pulse: 86  ?Resp: 20  ?Temp: 97.6 ?F (36.4 ?C)  ?SpO2: 93%  ? ?Filed Weights  ? 08/11/21 0836  ?Weight: 143 lb 3.2 oz (65 kg)  ? ?Physical Exam ?Vitals reviewed.  ?Constitutional:   ?   Appearance: Normal appearance.  ?HENT:  ?   Mouth/Throat:  ?   Mouth: No oral lesions.  ?   Tongue: No lesions.  ?Cardiovascular:  ?   Rate and Rhythm: Normal rate and regular rhythm.  ?   Pulses: Normal pulses.  ?   Heart sounds: Normal heart sounds.  ?Pulmonary:  ?   Effort: Pulmonary effort is normal.  ?   Breath sounds: Normal breath sounds.  ?Abdominal:  ?   Palpations: Abdomen is soft. There is no hepatomegaly,  splenomegaly or mass.  ?   Tenderness: There is no abdominal tenderness.  ?Musculoskeletal:  ?   Right lower leg: No edema.  ?   Left lower leg: No edema.  ?Lymphadenopathy:  ?   Cervical: No cervical adenopathy.  ?   Right cervical: No superficial, deep or posterior cervical adenopathy. ?   Left cervical: No superficial, deep or posterior cervical adenopathy.  ?   Upper Body:  ?   Right upper body: No supraclavicular, axillary, pectoral or epitrochlear adenopathy.  ?   Left upper body: No supraclavicular, axillary, pectoral or epitrochlear adenopathy.  ?   Lower Body: No right inguinal adenopathy. No left inguinal adenopathy.  ?Neurological:  ?  General: No focal deficit present.  ?   Mental Status: She is alert and oriented to person, place, and time.  ?Psychiatric:     ?   Mood and Affect: Mood normal.     ?   Behavior: Behavior normal.  ? ? ? ?LABORATORY DATA:  ?I have reviewed the data as listed ? ?  Latest Ref Rng & Units 07/13/2021  ?  9:25 PM 01/01/2019  ?  1:02 PM 09/29/2018  ?  3:37 AM  ?CBC  ?WBC 4.0 - 10.5 K/uL 8.9   10.2   13.5    ?Hemoglobin 12.0 - 15.0 g/dL 33.8   25.0   53.9    ?Hematocrit 36.0 - 46.0 % 48.7   47.8   41.4    ?Platelets 150 - 400 K/uL 214   261   225    ? ? ?  Latest Ref Rng & Units 07/13/2021  ?  9:25 PM 01/01/2019  ?  1:02 PM 09/29/2018  ?  3:37 AM  ?CMP  ?Glucose 70 - 99 mg/dL 767   341   937    ?BUN 6 - 20 mg/dL 15   15   10     ?Creatinine 0.44 - 1.00 mg/dL   9.02   4.09    ?Sodium 135 - 145 mmol/L 141   138   139    ?Potassium 3.5 - 5.1 mmol/L 3.2   4.2   3.4    ?Chloride 98 - 111 mmol/L 106   104   105    ?CO2 22 - 32 mmol/L 25   25   25     ?Calcium 8.9 - 10.3 mg/dL 9.2   8.8   8.9    ?Total Protein 6.5 - 8.1 g/dL 7.8    6.8    ?Total Bilirubin 0.3 - 1.2 mg/dL 0.4    0.6    ?Alkaline Phos 38 - 126 U/L 77    63    ?AST 15 - 41 U/L 32    28    ?ALT 0 - 44 U/L 30    34    ? ? ?RADIOGRAPHIC STUDIES: ?I have personally reviewed the radiological images as listed and agreed with the  findings in the report. ?DG Chest Port 1 View ? ?Result Date: 07/13/2021 ?CLINICAL DATA:  Short of breath, altered level of consciousness, hypoxia EXAM: PORTABLE CHEST 1 VIEW COMPARISON:  06/15/2018 FINDINGS: Single

## 2021-08-11 NOTE — Patient Instructions (Addendum)
[facility] at [facility] ?Discharge Instructions ? ? ?You were seen and examined today by Dr. Ellin Saba. Dr. Ellin Saba is a medical oncologist and blood specialist, meaning that he specializes in the treatment of cancer diagnoses and non-cancer blood disorders. Dr. Ellin Saba discussed your past medical history, family history of cancers, and the events that led to you being here today. ? ?Dr. Kirtland Bouchard performed your examination and did not feel any lymph nodes. ? ?We will check some additional lab work today. ? ?We will refer you to a GI doctor for screening colonoscopy. ? ?We will get a CT to screen for lung cancer. ? ?We will arrange for you to have a mammogram.  ? ?We will see you back in 1 week.  ? ? ? ? ? ? ?Thank you for choosing [facility] at [facility] to provide your oncology and hematology care.  To afford each patient quality time with our provider, please arrive at least 15 minutes before your scheduled appointment time.  ? ?If you have a lab appointment with the Cancer Center please come in thru the Main Entrance and check in at the main information desk. ? ?You need to re-schedule your appointment should you arrive 10 or more minutes late.  We strive to give you quality time with our providers, and arriving late affects you and other patients whose appointments are after yours.  Also, if you no show three or more times for appointments you may be dismissed from the clinic at the providers discretion.     ?Again, thank you for choosing [facility].  Our hope is that these requests will decrease the amount of time that you wait before being seen by our physicians.       ?_____________________________________________________________ ? ?Should you have questions after your visit to [facility], please contact our office at [phone] and follow the prompts.  Our office hours are 8:00 a.m. and 4:30 p.m. Monday - Friday.  Please note  that voicemails left after 4:00 p.m. may not be returned until the following business day.  We are closed weekends and major holidays.  You do have access to a nurse 24-7, just call the main number to the clinic [phone] and do not press any options, hold on the line and a nurse will answer the phone.   ? ?For prescription refill requests, have your pharmacy contact our office and allow 72 hours.   ? ?Due to Covid, you will need to wear a mask upon entering the hospital. If you do not have a mask, a mask will be given to you at the Main Entrance upon arrival. For doctor visits, patients may have 1 support person age 24 or older with them. For treatment visits, patients can not have anyone with them due to social distancing guidelines and our immunocompromised population.  ? ? ? ?

## 2021-08-12 ENCOUNTER — Encounter: Payer: Self-pay | Admitting: Internal Medicine

## 2021-08-13 ENCOUNTER — Other Ambulatory Visit (HOSPITAL_COMMUNITY): Payer: Self-pay | Admitting: Hematology

## 2021-08-13 ENCOUNTER — Ambulatory Visit (HOSPITAL_COMMUNITY)
Admission: RE | Admit: 2021-08-13 | Discharge: 2021-08-13 | Disposition: A | Discharge status: Home / Self Care | Payer: 59 | Source: Ambulatory Visit | Attending: Hematology | Admitting: Hematology

## 2021-08-13 ENCOUNTER — Encounter (HOSPITAL_COMMUNITY): Payer: Self-pay

## 2021-08-13 DIAGNOSIS — R0789 Other chest pain: Secondary | ICD-10-CM

## 2021-08-13 DIAGNOSIS — R591 Generalized enlarged lymph nodes: Secondary | ICD-10-CM | POA: Insufficient documentation

## 2021-08-13 DIAGNOSIS — N6310 Unspecified lump in the right breast, unspecified quadrant: Secondary | ICD-10-CM

## 2021-08-19 ENCOUNTER — Encounter: Payer: Self-pay | Admitting: Internal Medicine

## 2021-08-19 ENCOUNTER — Ambulatory Visit (INDEPENDENT_AMBULATORY_CARE_PROVIDER_SITE_OTHER): Payer: 59 | Admitting: Internal Medicine

## 2021-08-19 VITALS — BP 140/88 | HR 79 | Resp 18 | Ht 60.0 in | Wt 142.6 lb

## 2021-08-19 DIAGNOSIS — Z720 Tobacco use: Secondary | ICD-10-CM | POA: Diagnosis not present

## 2021-08-19 DIAGNOSIS — R591 Generalized enlarged lymph nodes: Secondary | ICD-10-CM | POA: Diagnosis not present

## 2021-08-19 DIAGNOSIS — I10 Essential (primary) hypertension: Secondary | ICD-10-CM

## 2021-08-19 DIAGNOSIS — M19041 Primary osteoarthritis, right hand: Secondary | ICD-10-CM | POA: Diagnosis not present

## 2021-08-19 DIAGNOSIS — K5904 Chronic idiopathic constipation: Secondary | ICD-10-CM

## 2021-08-19 DIAGNOSIS — R7303 Prediabetes: Secondary | ICD-10-CM

## 2021-08-19 DIAGNOSIS — J449 Chronic obstructive pulmonary disease, unspecified: Secondary | ICD-10-CM

## 2021-08-19 DIAGNOSIS — M65342 Trigger finger, left ring finger: Secondary | ICD-10-CM | POA: Insufficient documentation

## 2021-08-19 DIAGNOSIS — E782 Mixed hyperlipidemia: Secondary | ICD-10-CM

## 2021-08-19 DIAGNOSIS — Z1159 Encounter for screening for other viral diseases: Secondary | ICD-10-CM

## 2021-08-19 DIAGNOSIS — M19042 Primary osteoarthritis, left hand: Secondary | ICD-10-CM

## 2021-08-19 MED ORDER — IBUPROFEN 600 MG PO TABS: 600.0000 mg | ORAL_TABLET | Freq: Three times a day (TID) | ORAL | 0 refills | Status: DC | PRN

## 2021-08-19 MED ORDER — TELMISARTAN-HCTZ 40-12.5 MG PO TABS: 1.0000 | ORAL_TABLET | Freq: Every day | ORAL | 2 refills | Status: DC

## 2021-08-19 MED ORDER — ALBUTEROL SULFATE HFA 108 (90 BASE) MCG/ACT IN AERS: 2.0000 | INHALATION_SPRAY | Freq: Four times a day (QID) | RESPIRATORY_TRACT | 5 refills | Status: DC | PRN

## 2021-08-19 NOTE — Patient Instructions (Signed)
Please start taking Telmisartan-HCTZ instead of HCTZ.  Please use Albuterol inhaler as needed for shortness of breath or wheezing.  Please try to cut down -> quit smoking.  Please take Ibuprofen as needed for hand pain. You are being referred to Orthopedic surgery.

## 2021-08-19 NOTE — Assessment & Plan Note (Signed)
BP Readings from Last 1 Encounters:  08/19/21 140/88   Uncontrolled with its HCTZ 25 mg daily Switched to telmisartan-HCTZ 40-12.5 mg QD Counseled for compliance with the medications Advised DASH diet and moderate exercise/walking, at least 150 mins/week

## 2021-08-19 NOTE — Assessment & Plan Note (Signed)
Followed by heme-onc. Had elevated LDH

## 2021-08-19 NOTE — Assessment & Plan Note (Signed)
Tylenol as needed for now Referred to hand surgery for trigger finger

## 2021-08-19 NOTE — Progress Notes (Addendum)
New Patient Office Visit  Subjective:  Patient ID: [name], female    DOB: 02/27/1961  Age: 61 y.o. MRN: [id number]  CC:  Chief Complaint  Patient presents with   New Patient (Initial Visit)    New patient hasnt had pcp in years pt has left hand pain has been going on for 4 months also for the last 6 months pt has been sob     HPI [name] is a 61 y.o. female with past medical history of HTN and tobacco abuse who presents for establishing care.  HTN: Her BP was elevated in the office today.  She is on HCTZ 25 mg daily currently.  Of note, she used to take amlodipine and lisinopril in the past as well, but was discontinued by ER likely due to concern for hypotension.  She currently denies any headache, dizziness, chest pain or palpitations.  She recently saw Dr. Delton Coombes for LAD in her neck, which was found to be benign. She currently currently denies any lump in the neck.  She is scheduled to get mammography with [place] of breast.  She is scheduled scheduled to see GI for screening colonoscopy.  She has history of chronic constipation, but denies any melena or abdominal pain currently.  She complains of intermittent dyspnea, but denies any wheezing currently.  She has smoking history, currently smokes about 0.25 pack per day, but states that she used to smoke 1 pack/day.  She is to get low-dose CT chest for lung cancer screening.  She complains of left hand ring finger pain, which locks up at times.  She denies any recent injury.  She has OA of bilateral hands as well.  Past Medical History:  Diagnosis Date   Anxiety    Arthritis    Cholecystitis 09/11/2017   Constipation    COPD (chronic obstructive pulmonary disease) (HCC)    Family history of adverse reaction to anesthesia    brother was "allergic" to anesthesia - heart stopped beating on the surgery table   Gallbladder sludge    GERD (gastroesophageal reflux disease)    Headache    used to have migraines    Hypertension    Palpitations    Proximal phalanx fracture of finger 03/31/2015   Staph infection     Past Surgical History:  Procedure Laterality Date   CHOLECYSTECTOMY N/A 09/13/2017   Procedure: LAPAROSCOPIC CHOLECYSTECTOMY;  Surgeon: Virl Cagey, MD;  Location: [facility];  Service: General;  Laterality: N/A;   GROIN DEBRIDEMENT     due to spider bite,    OPEN REDUCTION INTERNAL FIXATION (ORIF) PROXIMAL PHALANX Right 04/07/2015   Procedure: OPEN REDUCTION INTERNAL FIXATION (ORIF) PROXIMAL PHALANX;  Surgeon: Meredith Pel, MD;  Location: [place];  Service: Orthopedics;  Laterality: Right;   TONSILLECTOMY     TUBAL LIGATION      Family History  Problem Relation Age of Onset   COPD Mother    Diabetes Father     Social History   Socioeconomic History   Marital status: Married    Spouse name: Not on file   Number of children: Not on file   Years of education: Not on file   Highest education level: Not on file  Occupational History   Not on file  Tobacco Use   Smoking status: Some Days    Packs/day: 0.25    Types: Cigarettes   Smokeless tobacco: Never  Substance and Sexual Activity   Alcohol use: Yes    Comment:  wine occ   Drug use: No   Sexual activity: Not on file  Other Topics Concern   Not on file  Social History Narrative   Not on file   Social Determinants of Health   Financial Resource Strain: Not on file  Food Insecurity: Not on file  Transportation Needs: Not on file  Physical Activity: Not on file  Stress: Not on file  Social Connections: Not on file  Intimate Partner Violence: Not on file    ROS Review of Systems  Constitutional:  Negative for chills and fever.  HENT:  Negative for congestion, sinus pressure, sinus pain and sore throat.   Eyes:  Negative for pain and discharge.  Respiratory:  Positive for shortness of breath. Negative for cough.   Cardiovascular:  Negative for chest pain and palpitations.  Gastrointestinal:  Positive for  constipation. Negative for abdominal pain, diarrhea, nausea and vomiting.  Endocrine: Negative for polydipsia and polyuria.  Genitourinary:  Negative for dysuria and hematuria.  Musculoskeletal:  Positive for arthralgias. Negative for neck pain and neck stiffness.  Skin:  Negative for rash.  Neurological:  Negative for dizziness and weakness.  Psychiatric/Behavioral:  Negative for agitation and behavioral problems.    Objective:   Today's Vitals: BP 140/88 (BP Location: Right Arm, Patient Position: Sitting, Cuff Size: Normal)   Pulse 79   Resp 18   Ht 5' (1.524 m)   Wt 142 lb 9.6 oz (64.7 kg)   SpO2 96%   BMI 27.85 kg/m   Physical Exam Vitals reviewed.  Constitutional:      General: She is not in acute distress.    Appearance: She is not diaphoretic.  HENT:     Head: Normocephalic and atraumatic.     Nose: Nose normal.     Mouth/Throat:     Mouth: Mucous membranes are moist.  Eyes:     General: No scleral icterus.    Extraocular Movements: Extraocular movements intact.  Cardiovascular:     Rate and Rhythm: Normal rate and regular rhythm.     Pulses: Normal pulses.     Heart sounds: Normal heart sounds. No murmur heard. Pulmonary:     Breath sounds: Normal breath sounds. No wheezing or rales.  Abdominal:     Palpations: Abdomen is soft.     Tenderness: There is no abdominal tenderness.  Musculoskeletal:     Right hand: Tenderness present.     Left hand: Tenderness present.     Cervical back: Neck supple. No tenderness.     Right lower leg: No edema.     Left lower leg: No edema.     Comments: [place] and Bouchard nodes bilaterally Trigger finger of left ring finger  Skin:    General: Skin is warm.     Findings: No rash.  Neurological:     General: No focal deficit present.     Mental Status: She is alert and oriented to person, place, and time.  Psychiatric:        Mood and Affect: Mood normal.        Behavior: Behavior normal.    Assessment & Plan:    Problem List Items Addressed This Visit       Cardiovascular and Mediastinum   Essential hypertension - Primary    BP Readings from Last 1 Encounters:  08/19/21 140/88  Uncontrolled with its HCTZ 25 mg daily Switched to telmisartan-HCTZ 40-12.5 mg QD Counseled for compliance with the medications Advised DASH diet and moderate exercise/walking, at  least 150 mins/week       Relevant Medications   telmisartan-hydrochlorothiazide (MICARDIS HCT) 40-12.5 MG tablet   Other Relevant Orders   CMP14+EGFR     Respiratory   Chronic obstructive pulmonary disease (HCC)    Has some smoking history Needs to quit smoking Albuterol as needed for now       Relevant Medications   albuterol (VENTOLIN HFA) 108 (90 Base) MCG/ACT inhaler     Digestive   Chronic idiopathic constipation    Advised to take MiraLAX twice daily as needed Improve hydration Follow-up with GI         Musculoskeletal and Integument   Primary osteoarthritis of both hands    Tylenol as needed for now Referred to hand surgery for trigger finger       Relevant Medications   ibuprofen (ADVIL) 600 MG tablet   Trigger ring finger of left hand   Relevant Medications   ibuprofen (ADVIL) 600 MG tablet   Other Relevant Orders   Ambulatory referral to Orthopedic Surgery     Immune and Lymphatic   Lymphadenopathy    Followed by heme-onc. Had elevated LDH         Other   Tobacco abuse    Smokes about 0.25 pack/day  Asked about quitting: confirms that he/she currently smokes cigarettes Advise to quit smoking: Educated about QUITTING to reduce the risk of cancer, cardio and cerebrovascular disease. Assess willingness: Unwilling to quit at this time, but is working on cutting back. Assist with counseling and pharmacotherapy: Counseled for 5 minutes and literature provided. Arrange for follow up: follow up in 3 months and continue to offer help.      Other Visit Diagnoses     Prediabetes       Relevant  Orders   Hemoglobin A1c   CMP14+EGFR   Mixed hyperlipidemia       Relevant Medications   telmisartan-hydrochlorothiazide (MICARDIS HCT) 40-12.5 MG tablet   Other Relevant Orders   Lipid Profile   Need for hepatitis C screening test       Relevant Orders   Hepatitis C Antibody       Outpatient Encounter Medications as of 08/19/2021  Medication Sig   albuterol (VENTOLIN HFA) 108 (90 Base) MCG/ACT inhaler Inhale 2 puffs into the lungs every 6 (six) hours as needed for wheezing or shortness of breath.   ibuprofen (ADVIL) 600 MG tablet Take 1 tablet (600 mg total) by mouth every 8 (eight) hours as needed.   telmisartan-hydrochlorothiazide (MICARDIS HCT) 40-12.5 MG tablet Take 1 tablet by mouth daily.   [DISCONTINUED] hydrochlorothiazide (HYDRODIURIL) 25 MG tablet Take 1 tablet (25 mg total) by mouth daily.   [DISCONTINUED] albuterol (PROVENTIL) (2.5 MG/3ML) 0.083% nebulizer solution Take 3 mLs (2.5 mg total) by nebulization every 6 (six) hours as needed for wheezing or shortness of breath. (Patient not taking: Reported on 08/19/2021)   [DISCONTINUED] amLODipine (NORVASC) 10 MG tablet Take 1 tablet (10 mg total) by mouth daily. (Patient not taking: Reported on 08/19/2021)   [DISCONTINUED] HYDROcodone-acetaminophen (NORCO/VICODIN) 5-325 MG tablet Take 1 tablet by mouth every 6 (six) hours as needed for moderate pain or severe pain. (Patient not taking: Reported on 08/19/2021)   [DISCONTINUED] meloxicam (MOBIC) 15 MG tablet Take 1 tablet (15 mg total) by mouth daily. (Patient not taking: Reported on 08/19/2021)   No facility-administered encounter medications on file as of 08/19/2021.    Follow-up: Return in about 6 weeks (around 09/30/2021) for HTN and COPD.   [name]  Keith Rake, MD

## 2021-08-19 NOTE — Assessment & Plan Note (Signed)
Smokes about 0.25 pack/day  Asked about quitting: confirms that he/she currently smokes cigarettes Advise to quit smoking: Educated about QUITTING to reduce the risk of cancer, cardio and cerebrovascular disease. Assess willingness: Unwilling to quit at this time, but is working on cutting back. Assist with counseling and pharmacotherapy: Counseled for 5 minutes and literature provided. Arrange for follow up: follow up in 3 months and continue to offer help. 

## 2021-08-19 NOTE — Assessment & Plan Note (Signed)
Has some smoking history Needs to quit smoking Albuterol as needed for now

## 2021-08-19 NOTE — Assessment & Plan Note (Addendum)
Advised to take MiraLAX twice daily as needed Improve hydration Follow-up with GI

## 2021-08-24 NOTE — Progress Notes (Deleted)
[facility] [address] [place], [place] [address]   CLINIC:  Medical Oncology/Hematology  PCP:  Anabel Halon, MD [address] [place] [address] [phone]   REASON FOR VISIT:  Follow-up for lymphadenopathy  PRIOR THERAPY: None  CURRENT THERAPY: Observation  INTERVAL HISTORY:  [name] 61 y.o. female returns for routine follow-up of lymphadenopathy.  She was seen for initial visit by Dr. Ellin Saba on 08/11/2021.  At today's visit, she reports feeling ***.  No changes in her baseline health since she was seen by Dr. Ellin Saba for initial visit 2 weeks ago.  ***Intermittent lymphadenopathy of neck and chest *** Progressive or waxing/waning? *** Association with alcohol - does this cause any pain? *** Smoking 0.5 PPD *** B symptoms  She has ***% energy and ***% appetite. She endorses that she is maintaining a stable weight.    REVIEW OF SYSTEMS:  Review of Systems - Oncology    PAST MEDICAL/SURGICAL HISTORY:  Past Medical History:  Diagnosis Date   Anxiety    Arthritis    Cholecystitis 09/11/2017   Constipation    COPD (chronic obstructive pulmonary disease) (HCC)    Family history of adverse reaction to anesthesia    brother was "allergic" to anesthesia - heart stopped beating on the surgery table   Gallbladder sludge    GERD (gastroesophageal reflux disease)    Headache    used to have migraines   Hypertension    Palpitations    Proximal phalanx fracture of finger 03/31/2015   Staph infection    Past Surgical History:  Procedure Laterality Date   CHOLECYSTECTOMY N/A 09/13/2017   Procedure: LAPAROSCOPIC CHOLECYSTECTOMY;  Surgeon: Lucretia Roers, MD;  Location: [facility];  Service: General;  Laterality: N/A;   GROIN DEBRIDEMENT     due to spider bite,    OPEN REDUCTION INTERNAL FIXATION (ORIF) PROXIMAL PHALANX Right 04/07/2015   Procedure: OPEN REDUCTION INTERNAL FIXATION (ORIF) PROXIMAL PHALANX;  Surgeon: Cammy Copa,  MD;  Location: [facility];  Service: Orthopedics;  Laterality: Right;   TONSILLECTOMY     TUBAL LIGATION       SOCIAL HISTORY:  Social History   Socioeconomic History   Marital status: Married    Spouse name: Not on file   Number of children: Not on file   Years of education: Not on file   Highest education level: Not on file  Occupational History   Not on file  Tobacco Use   Smoking status: Some Days    Packs/day: 0.25    Types: Cigarettes   Smokeless tobacco: Never  Substance and Sexual Activity   Alcohol use: Yes    Comment: wine occ   Drug use: No   Sexual activity: Not on file  Other Topics Concern   Not on file  Social History Narrative   Not on file   Social Determinants of Health   Financial Resource Strain: Not on file  Food Insecurity: Not on file  Transportation Needs: Not on file  Physical Activity: Not on file  Stress: Not on file  Social Connections: Not on file  Intimate Partner Violence: Not on file    FAMILY HISTORY:  Family History  Problem Relation Age of Onset   COPD Mother    Diabetes Father     CURRENT MEDICATIONS:  Outpatient Encounter Medications as of 08/25/2021  Medication Sig   albuterol (VENTOLIN HFA) 108 (90 Base) MCG/ACT inhaler Inhale 2 puffs into the lungs every 6 (  six) hours as needed for wheezing or shortness of breath.   ibuprofen (ADVIL) 600 MG tablet Take 1 tablet (600 mg total) by mouth every 8 (eight) hours as needed.   telmisartan-hydrochlorothiazide (MICARDIS HCT) 40-12.5 MG tablet Take 1 tablet by mouth daily.   No facility-administered encounter medications on file as of 08/25/2021.    ALLERGIES:  Allergies  Allergen Reactions   Penicillins Swelling    Throat swelling Has patient had a PCN reaction causing immediate rash, facial/tongue/throat swelling, SOB or lightheadedness with hypotension: Yes Has patient had a PCN reaction causing severe rash involving mucus membranes or skin necrosis: No Has patient had a PCN  reaction that required hospitalization  ED visit but no hospitalization Has patient had a PCN reaction occurring within the last 10 years: No If all of the above answers are "NO", then may proceed with Cephalosporin use.     PHYSICAL EXAM:  ECOG PERFORMANCE STATUS: {CHL ONC ECOG PS:[id number]}  There were no vitals filed for this visit. There were no vitals filed for this visit. Physical Exam   LABORATORY DATA:  I have reviewed the labs as listed.  CBC    Component Value Date/Time   WBC 8.9 07/13/2021 2125   RBC 5.36 (H) 07/13/2021 2125   HGB 15.9 (H) 07/13/2021 2125   HCT 48.7 (H) 07/13/2021 2125   PLT 214 07/13/2021 2125   MCV 90.9 07/13/2021 2125   MCH 29.7 07/13/2021 2125   MCHC 32.6 07/13/2021 2125   RDW 13.1 07/13/2021 2125   LYMPHSABS 3.8 07/13/2021 2125   MONOABS 0.6 07/13/2021 2125   EOSABS 0.1 07/13/2021 2125   BASOSABS 0.0 07/13/2021 2125      Latest Ref Rng & Units 07/13/2021    9:25 PM 01/01/2019    1:02 PM 09/29/2018    3:37 AM  CMP  Glucose 70 - 99 mg/dL 604124   540112   981112    BUN 6 - 20 mg/dL 15   15   10     Creatinine 0.44 - 1.00 mg/dL 1.910.70   4.780.63   2.950.62    Sodium 135 - 145 mmol/L 141   138   139    Potassium 3.5 - 5.1 mmol/L 3.2   4.2   3.4    Chloride 98 - 111 mmol/L 106   104   105    CO2 22 - 32 mmol/L 25   25   25     Calcium 8.9 - 10.3 mg/dL 9.2   8.8   8.9    Total Protein 6.5 - 8.1 g/dL 7.8    6.8    Total Bilirubin 0.3 - 1.2 mg/dL 0.4    0.6    Alkaline Phos 38 - 126 U/L 77    63    AST 15 - 41 U/L 32    28    ALT 0 - 44 U/L 30    34      DIAGNOSTIC IMAGING:  I have independently reviewed the relevant imaging and discussed with the patient.  ASSESSMENT & PLAN: 1.  Lymphadenopathy: - She reported on and off for swollen lymph nodes in her neck for the past 1 year. - Physical exam reveals subcentimeter lymph nodes in right neck but no significant lymphadenopathy.  No palpable adenopathy in axillary or inguinal region.  *** - Denies any B  symptoms.   ***  - Association with alcohol *** - Smoking *** - LDH orders minimally elevated at 201 (08/11/2021) - PLAN: No further  work-up at this time.  We will repeat LDH with RTC for physical exam in 6 months.  ***  2.  Health maintenance: - Recommend bilateral screening mammogram. - Recommend GI consultation for screening colonoscopy because of her bowel changes and occasional bleeding. - Referred to establish primary care with Dr. Allena Katz - PLAN: We have referred for age-appropriate screenings including mammogram (scheduled to be performed today) and referral to GI for colonoscopy    3.  Smoking history: - She smoked half pack per day for 40 years and 1 pack/day for 10 additional years. - We talked about the risks and benefits of low-dose screening lung cancer CT scan.  She is agreeable.  - PLAN: LDCT chest scheduled for 09/17/2021  4.  Other history - She worked as a Investment banker, operational on a food truck. - Current active smoker half pack per day for 40 years.  She smoked 1 pack/day for 10 years.  No exposure to chemicals. - Brother had head and neck cancer.  Mother died of lung cancer.    PLAN SUMMARY & DISPOSITION: ***  All questions were answered. The patient knows to call the clinic with any problems, questions or concerns.  Medical decision making: ***  Time spent on visit: I spent {CHL ONC TIME VISIT - JQBHA:[id number]} counseling the patient face to face. The total time spent in the appointment was {CHL ONC TIME VISIT - BDZHG:[id number]} and more than 50% was on counseling.   Carnella Guadalajara, PA-C  ***

## 2021-08-25 ENCOUNTER — Ambulatory Visit (HOSPITAL_COMMUNITY)
Admission: RE | Admit: 2021-08-25 | Discharge: 2021-08-25 | Disposition: A | Discharge status: Home / Self Care | Payer: 59 | Source: Ambulatory Visit | Attending: Hematology | Admitting: Hematology

## 2021-08-25 ENCOUNTER — Inpatient Hospital Stay (HOSPITAL_COMMUNITY): Payer: 59 | Admitting: Physician Assistant

## 2021-08-25 ENCOUNTER — Telehealth: Payer: Self-pay | Admitting: Internal Medicine

## 2021-08-25 DIAGNOSIS — N6310 Unspecified lump in the right breast, unspecified quadrant: Secondary | ICD-10-CM | POA: Insufficient documentation

## 2021-08-25 NOTE — Telephone Encounter (Signed)
Patient needs an appointment

## 2021-08-25 NOTE — Telephone Encounter (Signed)
Pt states she has trigger finger & she has been referred to ortho. States she is in bad pain & isn't able to sleep. States finger is locking up. Pt wants to know what she needs to do?

## 2021-09-01 ENCOUNTER — Ambulatory Visit: Payer: 59 | Admitting: Internal Medicine

## 2021-09-02 ENCOUNTER — Ambulatory Visit: Payer: 59 | Admitting: Orthopedic Surgery

## 2021-09-04 NOTE — Progress Notes (Deleted)
NO SHOW

## 2021-09-06 ENCOUNTER — Inpatient Hospital Stay (HOSPITAL_COMMUNITY): Payer: 59 | Attending: Hematology | Admitting: Physician Assistant

## 2021-09-08 ENCOUNTER — Telehealth: Payer: Self-pay | Admitting: *Deleted

## 2021-09-08 ENCOUNTER — Telehealth (INDEPENDENT_AMBULATORY_CARE_PROVIDER_SITE_OTHER): Payer: Self-pay

## 2021-09-08 ENCOUNTER — Encounter: Payer: Self-pay | Admitting: Internal Medicine

## 2021-09-08 ENCOUNTER — Ambulatory Visit (INDEPENDENT_AMBULATORY_CARE_PROVIDER_SITE_OTHER): Payer: 59 | Admitting: Internal Medicine

## 2021-09-08 VITALS — BP 192/101 | HR 79 | Temp 97.8°F | Ht 60.0 in | Wt 146.7 lb

## 2021-09-08 DIAGNOSIS — Z1211 Encounter for screening for malignant neoplasm of colon: Secondary | ICD-10-CM

## 2021-09-08 DIAGNOSIS — K581 Irritable bowel syndrome with constipation: Secondary | ICD-10-CM | POA: Diagnosis not present

## 2021-09-08 DIAGNOSIS — R1319 Other dysphagia: Secondary | ICD-10-CM | POA: Diagnosis not present

## 2021-09-08 DIAGNOSIS — R1012 Left upper quadrant pain: Secondary | ICD-10-CM

## 2021-09-08 DIAGNOSIS — Z791 Long term (current) use of non-steroidal anti-inflammatories (NSAID): Secondary | ICD-10-CM

## 2021-09-08 DIAGNOSIS — K219 Gastro-esophageal reflux disease without esophagitis: Secondary | ICD-10-CM

## 2021-09-08 NOTE — Patient Instructions (Signed)
We will schedule you for colonoscopy for colon cancer screening purposes.  At the same time we will perform upper endoscopy to evaluate your difficulty swallowing, chronic reflux, chronic abdominal pain.  For your chronic constipation, I am going to give you samples of a medication called Linzess 290 mcg daily.  You may have an initial washout period with this medication though this should improve over a few days.  Let us know next week if your symptoms are improved and I will send in a formal prescription.  It was very nice meeting you today.  Dr. Marletta Lor  At [facility] we value your feedback. You may receive a survey about your visit today. Please share your experience as we strive to create trusting relationships with our patients to provide genuine, compassionate, quality care.  We appreciate your understanding and patience as we review any laboratory studies, imaging, and other diagnostic tests that are ordered as we care for you. Our office policy is 5 business days for review of these results, and any emergent or urgent results are addressed in a timely manner for your best interest. If you do not hear from our office in 1 week, please contact us.   We also encourage the use of MyChart, which contains your medical information for your review as well. If you are not enrolled in this feature, an access code is on this after visit summary for your convenience. Thank you for allowing [place] to be involved in your care.  It was great to see you today!  I hope you have a great rest of your Spring!    Hennie Duos. Marletta Lor, D.O. Gastroenterology and Hepatology [facility]

## 2021-09-08 NOTE — Telephone Encounter (Signed)
Patient called back after her visit this am she reports she went home took her bp medication and laid down. She says the lowest she was able to get her bp was 167/89 she will let pcp Dr. Allena Katz know.

## 2021-09-08 NOTE — Progress Notes (Signed)
Primary Care Physician:  Lindell Spar, MD Primary Gastroenterologist:  Dr. Abbey Chatters  Chief Complaint  Patient presents with   Constipation    Patient here today due to issues with constipation. She says she has had this issue all of her life. She takes miralax daily for the issues, which helps some. She has 4 bm/ solid stools per week. She also is in need of a tcs. Bp elevated today,states she has not taken any bp meds this am. She does have a headache and blurred vision. Her pcp is Dr. Posey Pronto.     HPI:   [name] is a 61 y.o. female who presents to the clinic today by referral from her PCP Dr. Posey Pronto for evaluation.  Patient states she needs a colonoscopy.  No previous colonoscopy.  No family history of colorectal malignancy.  No previous colon cancer screening modality.  Has chronic GERD which is relatively well controlled on omeprazole 20 mg daily.  Does note progressively worsening dysphagia however.  Intermittent.  Feels as though food will get stuck in her substernal region.  No issue with pills.  Chronic tobacco use.  No previous EGD.  Does note chronic NSAID use with ibuprofen 600 mg daily.  Also with intermittent left upper quadrant pain.  Happens a few times a week.  Mild to moderate in severity.  Does not radiate.  Status post cholecystectomy 2019 for gallstones.   Past Medical History:  Diagnosis Date   Anxiety    Arthritis    Cholecystitis 09/11/2017   Constipation    COPD (chronic obstructive pulmonary disease) (HCC)    Family history of adverse reaction to anesthesia    brother was "allergic" to anesthesia - heart stopped beating on the surgery table   Gallbladder sludge    GERD (gastroesophageal reflux disease)    Headache    used to have migraines   Hypertension    Palpitations    Proximal phalanx fracture of finger 03/31/2015   Staph infection     Past Surgical History:  Procedure Laterality Date   CHOLECYSTECTOMY N/A 09/13/2017   Procedure:  LAPAROSCOPIC CHOLECYSTECTOMY;  Surgeon: Virl Cagey, MD;  Location: [facility];  Service: General;  Laterality: N/A;   GROIN DEBRIDEMENT     due to spider bite,    OPEN REDUCTION INTERNAL FIXATION (ORIF) PROXIMAL PHALANX Right 04/07/2015   Procedure: OPEN REDUCTION INTERNAL FIXATION (ORIF) PROXIMAL PHALANX;  Surgeon: Meredith Pel, MD;  Location: [place];  Service: Orthopedics;  Laterality: Right;   TONSILLECTOMY     TUBAL LIGATION      Current Outpatient Medications  Medication Sig Dispense Refill   albuterol (VENTOLIN HFA) 108 (90 Base) MCG/ACT inhaler Inhale 2 puffs into the lungs every 6 (six) hours as needed for wheezing or shortness of breath. 18 g 5   ibuprofen (ADVIL) 600 MG tablet Take 1 tablet (600 mg total) by mouth every 8 (eight) hours as needed. 30 tablet 0   omeprazole (PRILOSEC) 20 MG capsule Take 20 mg by mouth daily.     telmisartan-hydrochlorothiazide (MICARDIS HCT) 40-12.5 MG tablet Take 1 tablet by mouth daily. 30 tablet 2   No current facility-administered medications for this visit.    Allergies as of 09/08/2021 - Review Complete 09/08/2021  Allergen Reaction Noted   Penicillins Swelling 02/01/2011    Family History  Problem Relation Age of Onset   COPD Mother    Diabetes Father     Social History   Socioeconomic History  Marital status: Married    Spouse name: Not on file   Number of children: Not on file   Years of education: Not on file   Highest education level: Not on file  Occupational History   Not on file  Tobacco Use   Smoking status: Some Days    Packs/day: 0.50    Types: Cigarettes   Smokeless tobacco: Never  Vaping Use   Vaping Use: Never used  Substance and Sexual Activity   Alcohol use: Yes    Comment: wine occ   Drug use: No   Sexual activity: Not on file  Other Topics Concern   Not on file  Social History Narrative   Not on file   Social Determinants of Health   Financial Resource Strain: Not on file  Food  Insecurity: Not on file  Transportation Needs: Not on file  Physical Activity: Not on file  Stress: Not on file  Social Connections: Not on file  Intimate Partner Violence: Not on file    Subjective: Review of Systems  Constitutional:  Negative for chills and fever.  HENT:  Negative for congestion and hearing loss.   Eyes:  Negative for blurred vision and double vision.  Respiratory:  Negative for cough and shortness of breath.   Cardiovascular:  Negative for chest pain and palpitations.  Gastrointestinal:  Positive for abdominal pain, constipation and heartburn. Negative for blood in stool, diarrhea, melena and vomiting.  Genitourinary:  Negative for dysuria and urgency.  Musculoskeletal:  Negative for joint pain and myalgias.  Skin:  Negative for itching and rash.  Neurological:  Negative for dizziness and headaches.  Psychiatric/Behavioral:  Negative for depression. The patient is not nervous/anxious.        Objective: BP (!) 192/101 (BP Location: Left Arm, Patient Position: Sitting, Cuff Size: Large)   Pulse 79   Temp 97.8 F (36.6 C) (Oral)   Ht 5' (1.524 m)   Wt 146 lb 11.2 oz (66.5 kg)   BMI 28.65 kg/m  Physical Exam Constitutional:      Appearance: Normal appearance.  HENT:     Head: Normocephalic and atraumatic.  Eyes:     Extraocular Movements: Extraocular movements intact.     Conjunctiva/sclera: Conjunctivae normal.  Cardiovascular:     Rate and Rhythm: Normal rate and regular rhythm.  Pulmonary:     Effort: Pulmonary effort is normal.     Breath sounds: Normal breath sounds.  Abdominal:     General: Bowel sounds are normal.     Palpations: Abdomen is soft.  Musculoskeletal:        General: No swelling. Normal range of motion.     Cervical back: Normal range of motion and neck supple.  Skin:    General: Skin is warm and dry.     Coloration: Skin is not jaundiced.  Neurological:     General: No focal deficit present.     Mental Status: She is alert  and oriented to person, place, and time.  Psychiatric:        Mood and Affect: Mood normal.        Behavior: Behavior normal.      Assessment: *Colon cancer screening *LUQ abdominal pain-intermittent *Chronic idiopathic constipation  *Chronic GERD-intermittent, well controlled on Omeprazole  *Esophageal dysphagia  *Chronic NSAID use  Plan: Will schedule for EGD with possible dilation to evaluate for peptic ulcer disease, esophagitis, gastritis, H. Pylori, duodenitis, or other. Will also evaluate for esophageal stricture, Schatzki's ring, esophageal web or  other.   At the same time we will perform colonoscopy for colon cancer screening purposes.  ASA 3 given history of COPD.  The risks including infection, bleed, or perforation as well as benefits, limitations, alternatives and imponderables have been reviewed with the patient. Potential for esophageal dilation, biopsy, etc. have also been reviewed.  Questions have been answered. All parties agreeable.  Continue on omeprazole daily.  May need to make adjustments depending on EGD findings.  Likely cause of her abdominal pain IBS, constipation predominant.  We will give samples of Linzess 290 mcg daily.  Patient to call next week if symptoms improved and I will send in formal prescription.  Blood pressure elevated on clinic visit today.  Initially 190/100.  On manual recheck 170/90.  Patient states she has not taken her blood pressure medication today.  No chest pain or headache.  Does note blurry vision though this is chronic and she is seeing ophthalmology tomorrow.  Recommend that she go home and take her blood pressure medication immediately.  Monitor today.  She does have blood pressure cuff at home.  Recommend she call Dr. Posey Pronto with update.  Counseled to go to the ER immediately if she has headache, chest pain, worsening shortness of breath, worsening vision changes.  Thank you Dr. Posey Pronto for the kind referral.  09/08/2021 8:51  AM   Disclaimer: This note was dictated with voice recognition software. Similar sounding words can inadvertently be transcribed and may not be corrected upon review.

## 2021-09-08 NOTE — Telephone Encounter (Signed)
Noted, thank 

## 2021-09-08 NOTE — Telephone Encounter (Signed)
LMOVM to call back to schedule TCS/EGD with Dr. Carver, ASA 3 

## 2021-09-13 NOTE — Telephone Encounter (Signed)
LMOVM to call back 

## 2021-09-13 NOTE — Telephone Encounter (Signed)
Letter mailed

## 2021-09-17 ENCOUNTER — Ambulatory Visit (HOSPITAL_COMMUNITY): Admission: RE | Admit: 2021-09-17 | Payer: 59 | Source: Ambulatory Visit

## 2021-09-30 ENCOUNTER — Ambulatory Visit (INDEPENDENT_AMBULATORY_CARE_PROVIDER_SITE_OTHER): Payer: 59 | Admitting: Internal Medicine

## 2021-09-30 ENCOUNTER — Encounter: Payer: Self-pay | Admitting: Internal Medicine

## 2021-09-30 VITALS — BP 138/82 | HR 78 | Resp 18 | Ht 60.0 in | Wt 145.2 lb

## 2021-09-30 DIAGNOSIS — Z720 Tobacco use: Secondary | ICD-10-CM

## 2021-09-30 DIAGNOSIS — I10 Essential (primary) hypertension: Secondary | ICD-10-CM | POA: Diagnosis not present

## 2021-09-30 DIAGNOSIS — J439 Emphysema, unspecified: Secondary | ICD-10-CM | POA: Diagnosis not present

## 2021-09-30 DIAGNOSIS — Z01818 Encounter for other preprocedural examination: Secondary | ICD-10-CM | POA: Diagnosis not present

## 2021-09-30 MED ORDER — TRELEGY ELLIPTA 100-62.5-25 MCG/ACT IN AEPB: 1.0000 | INHALATION_SPRAY | Freq: Every day | RESPIRATORY_TRACT | 11 refills | Status: DC

## 2021-09-30 NOTE — Assessment & Plan Note (Signed)
Planned to get cataract surgery Medically optimized with low risk for low risk procedure

## 2021-09-30 NOTE — Progress Notes (Signed)
Established Patient Office Visit  Subjective:  Patient ID: [name], female    DOB: [birth year]  Age: 61 y.o. MRN: [id number]  CC:  Chief Complaint  Patient presents with   Follow-up    Follow up HTN and COPD bp has been running good at home does get sob sometimes thinks this is due to smoke in the air     HPI [name] is a 61 y.o. female with past medical history of HTN, COPD and tobacco abuse who presents for f/u of her chronic medical conditions and preop exam.  She is going to get cataract surgery.  HTN: BP is well-controlled. Takes medications regularly. Patient denies headache, dizziness, chest pain or palpitations.  COPD: She complains of dyspnea and has been using albuterol about 3-4 times per day for dyspnea and wheezing.  She attributes it to recent pollution/smoke in the air.  Denies any recent fever or chills.    Past Medical History:  Diagnosis Date   Anxiety    Arthritis    Cholecystitis 09/11/2017   Constipation    COPD (chronic obstructive pulmonary disease) (HCC)    Family history of adverse reaction to anesthesia    brother was "allergic" to anesthesia - heart stopped beating on the surgery table   Gallbladder sludge    GERD (gastroesophageal reflux disease)    Headache    used to have migraines   Hypertension    Palpitations    Proximal phalanx fracture of finger 03/31/2015   Staph infection     Past Surgical History:  Procedure Laterality Date   CHOLECYSTECTOMY N/A 09/13/2017   Procedure: LAPAROSCOPIC CHOLECYSTECTOMY;  Surgeon: Lucretia Roers, MD;  Location: [facility];  Service: General;  Laterality: N/A;   GROIN DEBRIDEMENT     due to spider bite,    OPEN REDUCTION INTERNAL FIXATION (ORIF) PROXIMAL PHALANX Right 04/07/2015   Procedure: OPEN REDUCTION INTERNAL FIXATION (ORIF) PROXIMAL PHALANX;  Surgeon: Cammy Copa, MD;  Location: [facility];  Service: Orthopedics;  Laterality: Right;   TONSILLECTOMY     TUBAL LIGATION      Family  History  Problem Relation Age of Onset   COPD Mother    Diabetes Father     Social History   Socioeconomic History   Marital status: Married    Spouse name: Not on file   Number of children: Not on file   Years of education: Not on file   Highest education level: Not on file  Occupational History   Not on file  Tobacco Use   Smoking status: Some Days    Packs/day: 0.50    Types: Cigarettes   Smokeless tobacco: Never  Vaping Use   Vaping Use: Never used  Substance and Sexual Activity   Alcohol use: Yes    Comment: wine occ   Drug use: No   Sexual activity: Not on file  Other Topics Concern   Not on file  Social History Narrative   Not on file   Social Determinants of Health   Financial Resource Strain: Not on file  Food Insecurity: Not on file  Transportation Needs: Not on file  Physical Activity: Not on file  Stress: Not on file  Social Connections: Not on file  Intimate Partner Violence: Not on file    Outpatient Medications Prior to Visit  Medication Sig Dispense Refill   albuterol (VENTOLIN HFA) 108 (90 Base) MCG/ACT inhaler Inhale 2 puffs into the lungs every 6 (six) hours as needed for wheezing  or shortness of breath. 18 g 5   ibuprofen (ADVIL) 600 MG tablet Take 1 tablet (600 mg total) by mouth every 8 (eight) hours as needed. 30 tablet 0   omeprazole (PRILOSEC) 20 MG capsule Take 20 mg by mouth daily.     telmisartan-hydrochlorothiazide (MICARDIS HCT) 40-12.5 MG tablet Take 1 tablet by mouth daily. 30 tablet 2   No facility-administered medications prior to visit.    Allergies  Allergen Reactions   Penicillins Swelling    Throat swelling Has patient had a PCN reaction causing immediate rash, facial/tongue/throat swelling, SOB or lightheadedness with hypotension: Yes Has patient had a PCN reaction causing severe rash involving mucus membranes or skin necrosis: No Has patient had a PCN reaction that required hospitalization  ED visit but no  hospitalization Has patient had a PCN reaction occurring within the last 10 years: No If all of the above answers are "NO", then may proceed with Cephalosporin use.    ROS Review of Systems  Constitutional:  Negative for chills and fever.  HENT:  Negative for congestion, sinus pressure, sinus pain and sore throat.   Eyes:  Negative for pain and discharge.  Respiratory:  Positive for shortness of breath. Negative for cough.   Cardiovascular:  Negative for chest pain and palpitations.  Gastrointestinal:  Positive for constipation. Negative for abdominal pain, diarrhea, nausea and vomiting.  Endocrine: Negative for polydipsia and polyuria.  Genitourinary:  Negative for dysuria and hematuria.  Musculoskeletal:  Positive for arthralgias. Negative for neck pain and neck stiffness.  Skin:  Negative for rash.  Neurological:  Negative for dizziness and weakness.  Psychiatric/Behavioral:  Negative for agitation and behavioral problems.       Objective:    Physical Exam Vitals reviewed.  Constitutional:      General: She is not in acute distress.    Appearance: She is not diaphoretic.  HENT:     Head: Normocephalic and atraumatic.     Nose: Nose normal.     Mouth/Throat:     Mouth: Mucous membranes are moist.  Eyes:     General: No scleral icterus.    Extraocular Movements: Extraocular movements intact.  Cardiovascular:     Rate and Rhythm: Normal rate and regular rhythm.     Pulses: Normal pulses.     Heart sounds: Normal heart sounds. No murmur heard. Pulmonary:     Breath sounds: Normal breath sounds. No wheezing or rales.  Musculoskeletal:     Right hand: Tenderness present.     Left hand: Tenderness present.     Cervical back: Neck supple. No tenderness.     Right lower leg: No edema.     Left lower leg: No edema.     Comments: [place] and Bouchard nodes bilaterally Trigger finger of left ring finger  Skin:    General: Skin is warm.     Findings: No rash.   Neurological:     General: No focal deficit present.     Mental Status: She is alert and oriented to person, place, and time.  Psychiatric:        Mood and Affect: Mood normal.        Behavior: Behavior normal.     BP 138/82 (BP Location: Right Arm, Patient Position: Sitting, Cuff Size: Normal)   Pulse 78   Resp 18   Ht 5' (1.524 m)   Wt 145 lb 3.2 oz (65.9 kg)   SpO2 93%   BMI 28.36 kg/m  Wt Readings from  Last 3 Encounters:  09/30/21 145 lb 3.2 oz (65.9 kg)  09/08/21 146 lb 11.2 oz (66.5 kg)  08/19/21 142 lb 9.6 oz (64.7 kg)    Lab Results  Component Value Date   TSH 3.372 09/28/2018   Lab Results  Component Value Date   WBC 8.9 07/13/2021   HGB 15.9 (H) 07/13/2021   HCT 48.7 (H) 07/13/2021   MCV 90.9 07/13/2021   PLT 214 07/13/2021   Lab Results  Component Value Date   NA 141 07/13/2021   K 3.2 (L) 07/13/2021   CO2 25 07/13/2021   GLUCOSE 124 (H) 07/13/2021   BUN 15 07/13/2021   CREATININE 0.70 07/13/2021   BILITOT 0.4 07/13/2021   ALKPHOS 77 07/13/2021   AST 32 07/13/2021   ALT 30 07/13/2021   PROT 7.8 07/13/2021   ALBUMIN 3.8 07/13/2021   CALCIUM 9.2 07/13/2021   ANIONGAP 10 07/13/2021   No results found for: "CHOL" No results found for: "HDL" No results found for: "LDLCALC" No results found for: "TRIG" No results found for: "CHOLHDL" Lab Results  Component Value Date   HGBA1C 5.5 09/28/2018      Assessment & Plan:   Problem List Items Addressed This Visit       Cardiovascular and Mediastinum   Essential hypertension    BP Readings from Last 1 Encounters:  09/30/21 138/82  Well-controlled with telmisartan-HCTZ 40-12.5 mg QD Counseled for compliance with the medications Advised DASH diet and moderate exercise/walking, at least 150 mins/week         Respiratory   Chronic obstructive pulmonary disease (HCC)    Chronic, intermittent dyspnea, worse with exertion and recent pollution Needs to quit smoking Albuterol as needed for  now Added Trelegy as maintenance inhaler, sample provided      Relevant Medications   Fluticasone-Umeclidin-Vilant (TRELEGY ELLIPTA) 100-62.5-25 MCG/ACT AEPB     Other   Tobacco abuse    Smokes about 0.25 pack/day  Asked about quitting: confirms that he/she currently smokes cigarettes Advise to quit smoking: Educated about QUITTING to reduce the risk of cancer, cardio and cerebrovascular disease. Assess willingness: Unwilling to quit at this time, but is working on cutting back. Assist with counseling and pharmacotherapy: Counseled for 5 minutes and literature provided. Arrange for follow up: follow up in 3 months and continue to offer help.      Preop examination - Primary    Planned to get cataract surgery Medically optimized with low risk for low risk procedure       Meds ordered this encounter  Medications   Fluticasone-Umeclidin-Vilant (TRELEGY ELLIPTA) 100-62.5-25 MCG/ACT AEPB    Sig: Inhale 1 puff into the lungs daily.    Dispense:  1 each    Refill:  11    Follow-up: Return in about 4 months (around 01/31/2022) for Annual physical.    Anabel Halon, MD

## 2021-09-30 NOTE — Patient Instructions (Signed)
Please continue to take medications as prescribed.  Please use Trelegy regularly and Albuterol as needed for shortness of breath or wheezing.

## 2021-09-30 NOTE — Assessment & Plan Note (Addendum)
BP Readings from Last 1 Encounters:  09/30/21 138/82   Well-controlled with telmisartan-HCTZ 40-12.5 mg QD Counseled for compliance with the medications Advised DASH diet and moderate exercise/walking, at least 150 mins/week

## 2021-09-30 NOTE — Assessment & Plan Note (Signed)
Smokes about 0.25 pack/day  Asked about quitting: confirms that he/she currently smokes cigarettes Advise to quit smoking: Educated about QUITTING to reduce the risk of cancer, cardio and cerebrovascular disease. Assess willingness: Unwilling to quit at this time, but is working on cutting back. Assist with counseling and pharmacotherapy: Counseled for 5 minutes and literature provided. Arrange for follow up: follow up in 3 months and continue to offer help. 

## 2021-09-30 NOTE — Assessment & Plan Note (Signed)
Chronic, intermittent dyspnea, worse with exertion and recent pollution Needs to quit smoking Albuterol as needed for now Added Trelegy as maintenance inhaler, sample provided

## 2021-12-14 ENCOUNTER — Telehealth: Payer: Self-pay | Admitting: Internal Medicine

## 2021-12-14 ENCOUNTER — Other Ambulatory Visit: Payer: Self-pay | Admitting: *Deleted

## 2021-12-14 DIAGNOSIS — I10 Essential (primary) hypertension: Secondary | ICD-10-CM

## 2021-12-14 MED ORDER — TELMISARTAN-HCTZ 40-12.5 MG PO TABS: 1.0000 | ORAL_TABLET | Freq: Every day | ORAL | 2 refills | Status: DC

## 2021-12-14 NOTE — Telephone Encounter (Signed)
Patient needs refill on   telmisartan-hydrochlorothiazide (MICARDIS HCT) 40-12.5 MG tablet

## 2021-12-14 NOTE — Telephone Encounter (Signed)
Medication sent to pharmacy  

## 2021-12-18 ENCOUNTER — Encounter: Payer: Self-pay | Admitting: Emergency Medicine

## 2021-12-18 ENCOUNTER — Ambulatory Visit
Admission: EM | Admit: 2021-12-18 | Discharge: 2021-12-18 | Disposition: A | Discharge status: Home / Self Care | Payer: 59 | Attending: Family Medicine | Admitting: Family Medicine

## 2021-12-18 DIAGNOSIS — T148XXA Other injury of unspecified body region, initial encounter: Secondary | ICD-10-CM

## 2021-12-18 DIAGNOSIS — S8991XA Unspecified injury of right lower leg, initial encounter: Secondary | ICD-10-CM | POA: Diagnosis not present

## 2021-12-18 DIAGNOSIS — Z23 Encounter for immunization: Secondary | ICD-10-CM

## 2021-12-18 DIAGNOSIS — W540XXA Bitten by dog, initial encounter: Secondary | ICD-10-CM

## 2021-12-18 DIAGNOSIS — S71131A Puncture wound without foreign body, right thigh, initial encounter: Secondary | ICD-10-CM

## 2021-12-18 MED ORDER — DOXYCYCLINE HYCLATE 100 MG PO CAPS: 100.0000 mg | ORAL_CAPSULE | Freq: Two times a day (BID) | ORAL | 0 refills | Status: DC

## 2021-12-18 MED ORDER — TETANUS-DIPHTH-ACELL PERTUSSIS 5-2.5-18.5 LF-MCG/0.5 IM SUSY
0.5000 mL | PREFILLED_SYRINGE | Freq: Once | INTRAMUSCULAR | Status: AC
  Administered 2021-12-18: 0.5 mL via INTRAMUSCULAR

## 2021-12-18 MED ORDER — CHLORHEXIDINE GLUCONATE 4 % EX LIQD: Freq: Every day | CUTANEOUS | 0 refills | Status: DC | PRN

## 2021-12-18 MED ORDER — MUPIROCIN 2 % EX OINT: 1.0000 | TOPICAL_OINTMENT | Freq: Two times a day (BID) | CUTANEOUS | 0 refills | Status: DC

## 2021-12-18 MED ORDER — IBUPROFEN 800 MG PO TABS
800.0000 mg | ORAL_TABLET | Freq: Once | ORAL | Status: AC
  Administered 2021-12-18: 800 mg via ORAL

## 2021-12-18 MED ORDER — METRONIDAZOLE 500 MG PO TABS: 500.0000 mg | ORAL_TABLET | Freq: Two times a day (BID) | ORAL | 0 refills | Status: DC

## 2021-12-18 MED ORDER — IBUPROFEN 800 MG PO TABS: 800.0000 mg | ORAL_TABLET | Freq: Three times a day (TID) | ORAL | 0 refills | Status: DC | PRN

## 2021-12-18 NOTE — Discharge Instructions (Signed)
Take the full course of both antibiotics even if you begin to feel better.  I have sent over ibuprofen and you can alternate Tylenol as well for pain control.  Ice, elevate, keep the wound covered at all times.  I have sent over Hibiclens solution to clean the wound with, mupirocin ointment to dress it and keep a patch bandage or other nonstick dressing covering the puncture wound.  Follow-up if your symptoms are worsening and not improving.

## 2021-12-18 NOTE — ED Provider Notes (Signed)
[facility]    CSN: [id number] Arrival date & time: 12/18/21  1121      History   Chief Complaint Chief Complaint  Patient presents with   Animal Bite    HPI [name] is a 61 y.o. female.   Patient presenting today with complaint of dog bite to bilateral legs, worst on the right thigh that occurred yesterday evening.  States the dog was her neighbors dog and neighbor states he is up-to-date on his rabies vaccine.  She states the area of the deep puncture on the right thigh is bruised, significantly painful, swollen.  No thick drainage, bleeding, numbness, tingling.  So far has not taken anything over-the-counter for symptoms.  Last tetanus was at least 5 years ago.    Past Medical History:  Diagnosis Date   Anxiety    Arthritis    Cholecystitis 09/11/2017   Constipation    COPD (chronic obstructive pulmonary disease) (HCC)    Family history of adverse reaction to anesthesia    brother was "allergic" to anesthesia - heart stopped beating on the surgery table   Gallbladder sludge    GERD (gastroesophageal reflux disease)    Headache    used to have migraines   Hypertension    Palpitations    Proximal phalanx fracture of finger 03/31/2015   Staph infection     Patient Active Problem List   Diagnosis Date Noted   Preop examination 09/30/2021   Primary osteoarthritis of both hands 08/19/2021   Chronic obstructive pulmonary disease (HCC) 08/19/2021   Trigger ring finger of left hand 08/19/2021   Chronic idiopathic constipation 08/19/2021   Lymphadenopathy 08/11/2021   Tobacco abuse 09/28/2018   Essential hypertension 09/28/2018    Past Surgical History:  Procedure Laterality Date   CHOLECYSTECTOMY N/A 09/13/2017   Procedure: LAPAROSCOPIC CHOLECYSTECTOMY;  Surgeon: Lucretia Roers, MD;  Location: [facility];  Service: General;  Laterality: N/A;   GROIN DEBRIDEMENT     due to spider bite,    OPEN REDUCTION INTERNAL FIXATION (ORIF) PROXIMAL PHALANX  Right 04/07/2015   Procedure: OPEN REDUCTION INTERNAL FIXATION (ORIF) PROXIMAL PHALANX;  Surgeon: Cammy Copa, MD;  Location: [facility];  Service: Orthopedics;  Laterality: Right;   TONSILLECTOMY     TUBAL LIGATION      OB History     Gravida      Para      Term      Preterm      AB      Living  3      SAB      IAB      Ectopic      Multiple      Live Births               Home Medications    Prior to Admission medications   Medication Sig Start Date End Date Taking? Authorizing Provider  albuterol (VENTOLIN HFA) 108 (90 Base) MCG/ACT inhaler Inhale 2 puffs into the lungs every 6 (six) hours as needed for wheezing or shortness of breath. 08/19/21  Yes Anabel Halon, MD  chlorhexidine (HIBICLENS) 4 % external liquid Apply topically daily as needed. 12/18/21  Yes Particia Nearing, PA-C  doxycycline (VIBRAMYCIN) 100 MG capsule Take 1 capsule (100 mg total) by mouth 2 (two) times daily. 12/18/21  Yes Particia Nearing, PA-C  Fluticasone-Umeclidin-Vilant (TRELEGY ELLIPTA) 100-62.5-25 MCG/ACT AEPB Inhale 1 puff into the lungs daily. 09/30/21  Yes Anabel Halon, MD  ibuprofen (  ADVIL) 600 MG tablet Take 1 tablet (600 mg total) by mouth every 8 (eight) hours as needed. 08/19/21  Yes Lindell Spar, MD  ibuprofen (ADVIL) 800 MG tablet Take 1 tablet (800 mg total) by mouth every 8 (eight) hours as needed. 12/18/21  Yes Volney American, PA-C  metroNIDAZOLE (FLAGYL) 500 MG tablet Take 1 tablet (500 mg total) by mouth 2 (two) times daily. 12/18/21  Yes Volney American, PA-C  mupirocin ointment (BACTROBAN) 2 % Apply 1 Application topically 2 (two) times daily. 12/18/21  Yes Volney American, PA-C  omeprazole (PRILOSEC) 20 MG capsule Take 20 mg by mouth daily.   Yes [provider]  telmisartan-hydrochlorothiazide (MICARDIS HCT) 40-12.5 MG tablet Take 1 tablet by mouth daily. 12/14/21  Yes Lindell Spar, MD    Family History Family History   Problem Relation Age of Onset   COPD Mother    Diabetes Father     Social History Social History   Tobacco Use   Smoking status: Some Days    Packs/day: 0.50    Types: Cigarettes   Smokeless tobacco: Never  Vaping Use   Vaping Use: Never used  Substance Use Topics   Alcohol use: Yes    Comment: wine occ   Drug use: No     Allergies   Penicillins   Review of Systems Review of Systems PER HPI  Physical Exam Triage Vital Signs ED Triage Vitals  Enc Vitals Group     BP 12/18/21 1201 (!) 193/99     Pulse Rate 12/18/21 1201 86     Resp 12/18/21 1201 18     Temp 12/18/21 1201 98.4 F (36.9 C)     Temp Source 12/18/21 1201 Oral     SpO2 12/18/21 1201 91 %     Weight --      Height --      Head Circumference --      Peak Flow --      Pain Score 12/18/21 1202 10     Pain Loc --      Pain Edu? --      Excl. in [place]? --    No data found.  Updated Vital Signs BP (!) 193/99 (BP Location: Right Arm)   Pulse 86   Temp 98.4 F (36.9 C) (Oral)   Resp 18   SpO2 91%   Visual Acuity Right Eye Distance:   Left Eye Distance:   Bilateral Distance:    Right Eye Near:   Left Eye Near:    Bilateral Near:     Physical Exam Vitals and nursing note reviewed.  Constitutional:      Appearance: Normal appearance. She is not ill-appearing.  HENT:     Head: Atraumatic.  Eyes:     Extraocular Movements: Extraocular movements intact.     Conjunctiva/sclera: Conjunctivae normal.  Cardiovascular:     Rate and Rhythm: Normal rate and regular rhythm.     Heart sounds: Normal heart sounds.  Pulmonary:     Effort: Pulmonary effort is normal.     Breath sounds: Normal breath sounds.  Musculoskeletal:        General: Normal range of motion.     Cervical back: Normal range of motion and neck supple.  Skin:    General: Skin is warm and dry.     Findings: Bruising and erythema present.     Comments: Puncture wound present to right medial thigh with significant bruising  surrounding.  Tender to  palpation and mildly edematous.  Other small superficial abrasions scattered across bilateral lower extremities from more minor injuries from the dog bites.  No drainage, fluctuance, induration from the puncture wound site  Neurological:     Mental Status: She is alert and oriented to person, place, and time.     Comments: bilateral lower extremities neurovascularly intact  Psychiatric:        Mood and Affect: Mood normal.        Thought Content: Thought content normal.        Judgment: Judgment normal.      UC Treatments / Results  Labs (all labs ordered are listed, but only abnormal results are displayed) Labs Reviewed - No data to display  EKG   Radiology No results found.  Procedures Procedures (including critical care time)  Medications Ordered in UC Medications  Tdap (BOOSTRIX) injection 0.5 mL (0.5 mLs Intramuscular Given 12/18/21 1226)  ibuprofen (ADVIL) tablet 800 mg (800 mg Oral Given 12/18/21 1232)    Initial Impression / Assessment and Plan / UC Course  I have reviewed the triage vital signs and the nursing notes.  Pertinent labs & imaging results that were available during my care of the patient were reviewed by me and considered in my medical decision making (see chart for details).     We will update tetanus shot, give ibuprofen in clinic for pain and send some for as needed use at home to alternate with Tylenol.  Patient has a penicillin allergy so we will treat with doxycycline and metronidazole combo to empirically protect against faction.  Hibiclens, mupirocin and good home wound care also reviewed.  Dog was up-to-date on rabies vaccines.  Final Clinical Impressions(s) / UC Diagnoses   Final diagnoses:  Dog bite, initial encounter  Puncture wound  Injury of right lower extremity, initial encounter     Discharge Instructions      Take the full course of both antibiotics even if you begin to feel better.  I have sent over  ibuprofen and you can alternate Tylenol as well for pain control.  Ice, elevate, keep the wound covered at all times.  I have sent over Hibiclens solution to clean the wound with, mupirocin ointment to dress it and keep a patch bandage or other nonstick dressing covering the puncture wound.  Follow-up if your symptoms are worsening and not improving.    ED Prescriptions     Medication Sig Dispense Auth. Provider   doxycycline (VIBRAMYCIN) 100 MG capsule Take 1 capsule (100 mg total) by mouth 2 (two) times daily. 14 capsule Particia Nearing, [place]   metroNIDAZOLE (FLAGYL) 500 MG tablet Take 1 tablet (500 mg total) by mouth 2 (two) times daily. 14 tablet Particia Nearing, [place]   chlorhexidine (HIBICLENS) 4 % external liquid Apply topically daily as needed. 120 mL Particia Nearing, PA-C   mupirocin ointment (BACTROBAN) 2 % Apply 1 Application topically 2 (two) times daily. 22 g Particia Nearing, [place]   ibuprofen (ADVIL) 800 MG tablet Take 1 tablet (800 mg total) by mouth every 8 (eight) hours as needed. 30 tablet Particia Nearing, [place]      PDMP not reviewed this encounter.   Particia Nearing, PA-C 12/18/21 1239    Particia Nearing, [place] 12/23/21 [phone]

## 2021-12-18 NOTE — ED Triage Notes (Signed)
Patient states that she was bitten by her neighbors dog on her right thigh yesterday.  The dog is current on rabies injection.  The area is bruised with a puncture bite.  Last Tdap over 5 years.

## 2021-12-21 ENCOUNTER — Ambulatory Visit: Payer: 59 | Admitting: Family Medicine

## 2022-01-11 ENCOUNTER — Encounter: Payer: Self-pay | Admitting: Family Medicine

## 2022-01-11 ENCOUNTER — Other Ambulatory Visit (HOSPITAL_COMMUNITY)
Admission: RE | Admit: 2022-01-11 | Discharge: 2022-01-11 | Disposition: A | Discharge status: Home / Self Care | Payer: 59 | Source: Ambulatory Visit | Attending: Family Medicine | Admitting: Family Medicine

## 2022-01-11 ENCOUNTER — Ambulatory Visit (INDEPENDENT_AMBULATORY_CARE_PROVIDER_SITE_OTHER): Payer: 59 | Admitting: Family Medicine

## 2022-01-11 VITALS — BP 132/86 | HR 86 | Ht 60.0 in | Wt 144.0 lb

## 2022-01-11 DIAGNOSIS — Z124 Encounter for screening for malignant neoplasm of cervix: Secondary | ICD-10-CM | POA: Diagnosis present

## 2022-01-11 NOTE — Progress Notes (Signed)
Established Patient Office Visit  Subjective:  Patient ID: [name], female    DOB: 04-19-1960  Age: 61 y.o. MRN: [id number]  CC:  Chief Complaint  Patient presents with   Gynecologic Exam    Pap smear today.     HPI [name] is a 61 y.o. female with past medical history of essential hypertension, chronic obstructive pulmonary disease, and tobacco use presents for a Pap smear.  Past Medical History:  Diagnosis Date   Anxiety    Arthritis    Cholecystitis 09/11/2017   Constipation    COPD (chronic obstructive pulmonary disease) (HCC)    Family history of adverse reaction to anesthesia    brother was "allergic" to anesthesia - heart stopped beating on the surgery table   Gallbladder sludge    GERD (gastroesophageal reflux disease)    Headache    used to have migraines   Hypertension    Palpitations    Proximal phalanx fracture of finger 03/31/2015   Staph infection     Past Surgical History:  Procedure Laterality Date   CHOLECYSTECTOMY N/A 09/13/2017   Procedure: LAPAROSCOPIC CHOLECYSTECTOMY;  Surgeon: Virl Cagey, MD;  Location: [facility];  Service: General;  Laterality: N/A;   GROIN DEBRIDEMENT     due to spider bite,    OPEN REDUCTION INTERNAL FIXATION (ORIF) PROXIMAL PHALANX Right 04/07/2015   Procedure: OPEN REDUCTION INTERNAL FIXATION (ORIF) PROXIMAL PHALANX;  Surgeon: Meredith Pel, MD;  Location: [facility];  Service: Orthopedics;  Laterality: Right;   TONSILLECTOMY     TUBAL LIGATION      Family History  Problem Relation Age of Onset   COPD Mother    Diabetes Father     Social History   Socioeconomic History   Marital status: Married    Spouse name: Not on file   Number of children: Not on file   Years of education: Not on file   Highest education level: Not on file  Occupational History   Not on file  Tobacco Use   Smoking status: Some Days    Packs/day: 0.50    Types: Cigarettes   Smokeless tobacco: Never  Vaping Use   Vaping  Use: Never used  Substance and Sexual Activity   Alcohol use: Yes    Comment: wine occ   Drug use: No   Sexual activity: Not on file  Other Topics Concern   Not on file  Social History Narrative   Not on file   Social Determinants of Health   Financial Resource Strain: Not on file  Food Insecurity: Not on file  Transportation Needs: Not on file  Physical Activity: Not on file  Stress: Not on file  Social Connections: Not on file  Intimate Partner Violence: Not on file    Outpatient Medications Prior to Visit  Medication Sig Dispense Refill   albuterol (VENTOLIN HFA) 108 (90 Base) MCG/ACT inhaler Inhale 2 puffs into the lungs every 6 (six) hours as needed for wheezing or shortness of breath. 18 g 5   chlorhexidine (HIBICLENS) 4 % external liquid Apply topically daily as needed. 120 mL 0   doxycycline (VIBRAMYCIN) 100 MG capsule Take 1 capsule (100 mg total) by mouth 2 (two) times daily. 14 capsule 0   Fluticasone-Umeclidin-Vilant (TRELEGY ELLIPTA) 100-62.5-25 MCG/ACT AEPB Inhale 1 puff into the lungs daily. 1 each 11   ibuprofen (ADVIL) 600 MG tablet Take 1 tablet (600 mg total) by mouth every 8 (eight) hours as needed. 30 tablet 0  ibuprofen (ADVIL) 800 MG tablet Take 1 tablet (800 mg total) by mouth every 8 (eight) hours as needed. 30 tablet 0   metroNIDAZOLE (FLAGYL) 500 MG tablet Take 1 tablet (500 mg total) by mouth 2 (two) times daily. 14 tablet 0   mupirocin ointment (BACTROBAN) 2 % Apply 1 Application topically 2 (two) times daily. 22 g 0   omeprazole (PRILOSEC) 20 MG capsule Take 20 mg by mouth daily.     telmisartan-hydrochlorothiazide (MICARDIS HCT) 40-12.5 MG tablet Take 1 tablet by mouth daily. 30 tablet 2   No facility-administered medications prior to visit.    Allergies  Allergen Reactions   Penicillins Swelling    Throat swelling Has patient had a PCN reaction causing immediate rash, facial/tongue/throat swelling, SOB or lightheadedness with hypotension:  Yes Has patient had a PCN reaction causing severe rash involving mucus membranes or skin necrosis: No Has patient had a PCN reaction that required hospitalization  ED visit but no hospitalization Has patient had a PCN reaction occurring within the last 10 years: No If all of the above answers are "NO", then may proceed with Cephalosporin use.    ROS Review of Systems  Constitutional:  Negative for fever.  Eyes:  Negative for visual disturbance.  Respiratory:  Negative for chest tightness and shortness of breath.   Cardiovascular:  Negative for chest pain and palpitations.  Genitourinary:  Negative for decreased urine volume, flank pain, frequency, vaginal bleeding, vaginal discharge and vaginal pain.      Objective:    Physical Exam HENT:     Head: Normocephalic.     Right Ear: External ear normal.     Left Ear: External ear normal.  Cardiovascular:     Rate and Rhythm: Normal rate and regular rhythm.     Pulses: Normal pulses.     Heart sounds: Normal heart sounds.  Pulmonary:     Effort: Pulmonary effort is normal.     Breath sounds: Normal breath sounds.  Genitourinary:    Exam position: Lithotomy position.     Pubic Area: No rash or pubic lice.      Tanner stage (genital): 5.     Comments: Vaginal wall: pink and rugated, smooth and non-tender; absence of lesions, edema, and erythema. Labia Majora and Minora: present bilaterally, moist, soft tissue, and homogeneous; free of edema and ulcerations. Clitoris is anatomically present, above the urethral, and free of lesions, masses, and ulceration.  Neurological:     Mental Status: She is alert.     BP 132/86   Pulse 86   Ht 5' (1.524 m)   Wt 144 lb (65.3 kg)   SpO2 95%   BMI 28.12 kg/m  Wt Readings from Last 3 Encounters:  01/11/22 144 lb (65.3 kg)  09/30/21 145 lb 3.2 oz (65.9 kg)  09/08/21 146 lb 11.2 oz (66.5 kg)    Lab Results  Component Value Date   TSH 3.372 09/28/2018   Lab Results  Component Value  Date   WBC 8.9 07/13/2021   HGB 15.9 (H) 07/13/2021   HCT 48.7 (H) 07/13/2021   MCV 90.9 07/13/2021   PLT 214 07/13/2021   Lab Results  Component Value Date   NA 141 07/13/2021   K 3.2 (L) 07/13/2021   CO2 25 07/13/2021   GLUCOSE 124 (H) 07/13/2021   BUN 15 07/13/2021   CREATININE 0.70 07/13/2021   BILITOT 0.4 07/13/2021   ALKPHOS 77 07/13/2021   AST 32 07/13/2021   ALT 30 07/13/2021  PROT 7.8 07/13/2021   ALBUMIN 3.8 07/13/2021   CALCIUM 9.2 07/13/2021   ANIONGAP 10 07/13/2021   No results found for: "CHOL" No results found for: "HDL" No results found for: "LDLCALC" No results found for: "TRIG" No results found for: "CHOLHDL" Lab Results  Component Value Date   HGBA1C 5.5 09/28/2018      Assessment & Plan:   Problem List Items Addressed This Visit       Other   Pap smear for cervical cancer screening - Primary    - According to the American Cancer Society, cytology and HPV co-testing (preferred) every 5 years or cytology alone (acceptable) every 3 years. - Pap due 2026/2028      Other Visit Diagnoses     Cervical cancer screening       Relevant Orders   Cytology - PAP       No orders of the defined types were placed in this encounter.   Follow-up: No follow-ups on file.    Alvira Monday, FNP

## 2022-01-11 NOTE — Patient Instructions (Signed)
I appreciate the opportunity to provide care to you today!    Follow up: Dr. Posey Pronto  We will inform you of the results of your Pap smear    Please continue to a heart-healthy diet and increase your physical activities. Try to exercise for 16mins at least three times a week.      It was a pleasure to see you and I look forward to continuing to work together on your health and well-being. Please do not hesitate to call the office if you need care or have questions about your care.   Have a wonderful day and week. With Gratitude, Alvira Monday MSN, FNP-BC

## 2022-01-11 NOTE — Assessment & Plan Note (Signed)
-   According to the American Cancer Society, cytology and HPV co-testing (preferred) every 5 years or cytology alone (acceptable) every 3 years. - Pap due 2026/2028 

## 2022-01-12 ENCOUNTER — Encounter: Payer: Self-pay | Admitting: Internal Medicine

## 2022-01-12 ENCOUNTER — Ambulatory Visit: Payer: 59 | Admitting: Internal Medicine

## 2022-01-14 LAB — CYTOLOGY - PAP
Comment: NEGATIVE
Diagnosis: NEGATIVE
High risk HPV: NEGATIVE

## 2022-01-14 NOTE — Progress Notes (Signed)
Please inform the patient that her pap smear was negative for abnormal growth or malignancy on her cervix.

## 2022-02-02 ENCOUNTER — Ambulatory Visit (INDEPENDENT_AMBULATORY_CARE_PROVIDER_SITE_OTHER): Payer: 59 | Admitting: Internal Medicine

## 2022-02-02 ENCOUNTER — Encounter: Payer: Self-pay | Admitting: Internal Medicine

## 2022-02-02 VITALS — BP 154/92 | HR 93 | Ht 60.0 in | Wt 144.2 lb

## 2022-02-02 DIAGNOSIS — G5603 Carpal tunnel syndrome, bilateral upper limbs: Secondary | ICD-10-CM | POA: Insufficient documentation

## 2022-02-02 DIAGNOSIS — Z720 Tobacco use: Secondary | ICD-10-CM

## 2022-02-02 DIAGNOSIS — M5432 Sciatica, left side: Secondary | ICD-10-CM

## 2022-02-02 DIAGNOSIS — M5431 Sciatica, right side: Secondary | ICD-10-CM

## 2022-02-02 DIAGNOSIS — J439 Emphysema, unspecified: Secondary | ICD-10-CM

## 2022-02-02 DIAGNOSIS — M5412 Radiculopathy, cervical region: Secondary | ICD-10-CM

## 2022-02-02 DIAGNOSIS — I10 Essential (primary) hypertension: Secondary | ICD-10-CM

## 2022-02-02 DIAGNOSIS — Z23 Encounter for immunization: Secondary | ICD-10-CM

## 2022-02-02 DIAGNOSIS — F17210 Nicotine dependence, cigarettes, uncomplicated: Secondary | ICD-10-CM

## 2022-02-02 DIAGNOSIS — Z0001 Encounter for general adult medical examination with abnormal findings: Secondary | ICD-10-CM

## 2022-02-02 DIAGNOSIS — G609 Hereditary and idiopathic neuropathy, unspecified: Secondary | ICD-10-CM

## 2022-02-02 MED ORDER — BREZTRI AEROSPHERE 160-9-4.8 MCG/ACT IN AERO: 2.0000 | INHALATION_SPRAY | Freq: Two times a day (BID) | RESPIRATORY_TRACT | 11 refills | Status: DC

## 2022-02-02 MED ORDER — VARENICLINE TARTRATE 0.5 MG PO TABS: ORAL_TABLET | ORAL | 0 refills | Status: AC

## 2022-02-02 MED ORDER — VARENICLINE TARTRATE 1 MG PO TABS: 1.0000 mg | ORAL_TABLET | Freq: Two times a day (BID) | ORAL | 2 refills | Status: DC

## 2022-02-02 MED ORDER — GABAPENTIN 100 MG PO CAPS: 100.0000 mg | ORAL_CAPSULE | Freq: Every day | ORAL | 3 refills | Status: DC

## 2022-02-02 NOTE — Assessment & Plan Note (Signed)
BP Readings from Last 1 Encounters:  02/02/22 (!) 154/92   Usually well-controlled with telmisartan-HCTZ 40-12.5 mg QD, elevated today likely due to pain Counseled for compliance with the medications Advised DASH diet and moderate exercise/walking, at least 150 mins/week

## 2022-02-02 NOTE — Assessment & Plan Note (Signed)
Has chronic low back pain with radicular symptoms Has history of DDD of lumbar spine Started gabapentin for neuropathic pain

## 2022-02-02 NOTE — Assessment & Plan Note (Addendum)
Has bilateral forearm and hand numbness Likely from cervical radiculopathy Check x-ray of cervical spine Started gabapentin 100 mg nightly

## 2022-02-02 NOTE — Assessment & Plan Note (Signed)
Chronic, intermittent dyspnea, worse with exertion and recent pollution Needs to quit smoking Albuterol as needed for now Added Breztri as maintenance inhaler - did not tolerate Trelegy

## 2022-02-02 NOTE — Assessment & Plan Note (Signed)
Smokes about 0.25 pack/day ? ?Asked about quitting: confirms that he/she currently smokes cigarettes ?Advise to quit smoking: Educated about QUITTING to reduce the risk of cancer, cardio and cerebrovascular disease. ?Assess willingness: Unwilling to quit at this time, but is working on cutting back. ?Assist with counseling and pharmacotherapy: Counseled for 5 minutes and literature provided. Started Chantix. ?Arrange for follow up: follow up in 3 months and continue to offer help. ?

## 2022-02-02 NOTE — Progress Notes (Signed)
Established Patient Office Visit  Subjective:  Patient ID: [name], female    DOB: 18-May-1960  Age: 61 y.o. MRN: [id number]  CC:  Chief Complaint  Patient presents with   Annual Exam    CPE Hands,arms,legs numb feeling, sciatic pain    HPI [name] is a 61 y.o. female with past medical history of HTN, COPD and tobacco abuse who presents for annual physical.  HTN: Her BP was elevated in the office today.  She is on telmisartan- HCTZ 40-12.5 mg QD currently.  She currently denies any headache, dizziness, chest pain or palpitations.  She attributes elevated BP to her pain in the hands and legs.  She reports chronic low back pain, which is sharp, radiating to bilateral LE and is associated with numbness of the LE.  She has history of DDD of lumbar spine.  She also reports numbness of the hands and forearms, especially worse at night time.  She complains of intermittent dyspnea, but denies any wheezing currently.  She tried Trelegy, but had palpitations with it.  She is using albuterol inhaler as needed for dyspnea or wheezing. She has smoking history, currently smokes about 3 cigarettes per day, but states that she used to smoke 1 pack/day.   Past Medical History:  Diagnosis Date   Anxiety    Arthritis    Cholecystitis 09/11/2017   Constipation    COPD (chronic obstructive pulmonary disease) (HCC)    Family history of adverse reaction to anesthesia    brother was "allergic" to anesthesia - heart stopped beating on the surgery table   Gallbladder sludge    GERD (gastroesophageal reflux disease)    Headache    used to have migraines   Hypertension    Palpitations    Proximal phalanx fracture of finger 03/31/2015   Staph infection     Past Surgical History:  Procedure Laterality Date   CHOLECYSTECTOMY N/A 09/13/2017   Procedure: LAPAROSCOPIC CHOLECYSTECTOMY;  Surgeon: Lucretia Roers, MD;  Location: [facility];  Service: General;  Laterality: N/A;   GROIN DEBRIDEMENT      due to spider bite,    OPEN REDUCTION INTERNAL FIXATION (ORIF) PROXIMAL PHALANX Right 04/07/2015   Procedure: OPEN REDUCTION INTERNAL FIXATION (ORIF) PROXIMAL PHALANX;  Surgeon: Cammy Copa, MD;  Location: [facility];  Service: Orthopedics;  Laterality: Right;   TONSILLECTOMY     TUBAL LIGATION      Family History  Problem Relation Age of Onset   COPD Mother    Diabetes Father     Social History   Socioeconomic History   Marital status: Married    Spouse name: Not on file   Number of children: Not on file   Years of education: Not on file   Highest education level: Not on file  Occupational History   Not on file  Tobacco Use   Smoking status: Some Days    Packs/day: 0.50    Types: Cigarettes   Smokeless tobacco: Never  Vaping Use   Vaping Use: Never used  Substance and Sexual Activity   Alcohol use: Yes    Comment: wine occ   Drug use: No   Sexual activity: Not on file  Other Topics Concern   Not on file  Social History Narrative   Not on file   Social Determinants of Health   Financial Resource Strain: Not on file  Food Insecurity: Not on file  Transportation Needs: Not on file  Physical Activity: Not on file  Stress: Not on file  Social Connections: Not on file  Intimate Partner Violence: Not on file    Outpatient Medications Prior to Visit  Medication Sig Dispense Refill   albuterol (VENTOLIN HFA) 108 (90 Base) MCG/ACT inhaler Inhale 2 puffs into the lungs every 6 (six) hours as needed for wheezing or shortness of breath. 18 g 5   ibuprofen (ADVIL) 800 MG tablet Take 1 tablet (800 mg total) by mouth every 8 (eight) hours as needed. 30 tablet 0   omeprazole (PRILOSEC) 20 MG capsule Take 20 mg by mouth daily.     telmisartan-hydrochlorothiazide (MICARDIS HCT) 40-12.5 MG tablet Take 1 tablet by mouth daily. 30 tablet 2   Fluticasone-Umeclidin-Vilant (TRELEGY ELLIPTA) 100-62.5-25 MCG/ACT AEPB Inhale 1 puff into the lungs daily. 1 each 11   mupirocin  ointment (BACTROBAN) 2 % Apply 1 Application topically 2 (two) times daily. 22 g 0   chlorhexidine (HIBICLENS) 4 % external liquid Apply topically daily as needed. (Patient not taking: Reported on 02/02/2022) 120 mL 0   doxycycline (VIBRAMYCIN) 100 MG capsule Take 1 capsule (100 mg total) by mouth 2 (two) times daily. (Patient not taking: Reported on 02/02/2022) 14 capsule 0   ibuprofen (ADVIL) 600 MG tablet Take 1 tablet (600 mg total) by mouth every 8 (eight) hours as needed. (Patient not taking: Reported on 02/02/2022) 30 tablet 0   metroNIDAZOLE (FLAGYL) 500 MG tablet Take 1 tablet (500 mg total) by mouth 2 (two) times daily. (Patient not taking: Reported on 02/02/2022) 14 tablet 0   No facility-administered medications prior to visit.    Allergies  Allergen Reactions   Penicillins Swelling    Throat swelling Has patient had a PCN reaction causing immediate rash, facial/tongue/throat swelling, SOB or lightheadedness with hypotension: Yes Has patient had a PCN reaction causing severe rash involving mucus membranes or skin necrosis: No Has patient had a PCN reaction that required hospitalization  ED visit but no hospitalization Has patient had a PCN reaction occurring within the last 10 years: No If all of the above answers are "NO", then may proceed with Cephalosporin use.    ROS Review of Systems  Constitutional:  Negative for chills and fever.  HENT:  Negative for congestion, sinus pressure, sinus pain and sore throat.   Eyes:  Negative for pain and discharge.  Respiratory:  Positive for shortness of breath. Negative for cough.   Cardiovascular:  Negative for chest pain and palpitations.  Gastrointestinal:  Positive for constipation. Negative for abdominal pain, diarrhea, nausea and vomiting.  Endocrine: Negative for polydipsia and polyuria.  Genitourinary:  Negative for dysuria and hematuria.  Musculoskeletal:  Positive for arthralgias and back pain. Negative for neck stiffness.   Skin:  Negative for rash.  Neurological:  Positive for numbness. Negative for dizziness and weakness.  Psychiatric/Behavioral:  Negative for agitation and behavioral problems.       Objective:    Physical Exam Vitals reviewed.  Constitutional:      General: She is not in acute distress.    Appearance: She is not diaphoretic.  HENT:     Head: Normocephalic and atraumatic.     Nose: Nose normal.     Mouth/Throat:     Mouth: Mucous membranes are moist.  Eyes:     General: No scleral icterus.    Extraocular Movements: Extraocular movements intact.  Cardiovascular:     Rate and Rhythm: Normal rate and regular rhythm.     Pulses: Normal pulses.     Heart sounds:  Normal heart sounds. No murmur heard. Pulmonary:     Breath sounds: Normal breath sounds. No wheezing or rales.  Abdominal:     Palpations: Abdomen is soft.     Tenderness: There is no abdominal tenderness.  Musculoskeletal:     Right hand: Tenderness present.     Left hand: Tenderness present.     Cervical back: Neck supple. No tenderness.     Right lower leg: No edema.     Left lower leg: No edema.     Comments: Heberden and Bouchard nodes bilaterally Trigger finger of left ring finger  Skin:    General: Skin is warm.     Findings: No rash.  Neurological:     General: No focal deficit present.     Mental Status: She is alert and oriented to person, place, and time.     Sensory: Sensory deficit (B/l hands) present.     Motor: No weakness.  Psychiatric:        Mood and Affect: Mood normal.        Behavior: Behavior normal.     BP (!) 154/92 (BP Location: Left Arm, Patient Position: Sitting, Cuff Size: Normal)   Pulse 93   Ht 5' (1.524 m)   Wt 144 lb 3.2 oz (65.4 kg)   SpO2 91%   BMI 28.16 kg/m  Wt Readings from Last 3 Encounters:  02/02/22 144 lb 3.2 oz (65.4 kg)  01/11/22 144 lb (65.3 kg)  09/30/21 145 lb 3.2 oz (65.9 kg)    Lab Results  Component Value Date   TSH 3.372 09/28/2018   Lab  Results  Component Value Date   WBC 8.9 07/13/2021   HGB 15.9 (H) 07/13/2021   HCT 48.7 (H) 07/13/2021   MCV 90.9 07/13/2021   PLT 214 07/13/2021   Lab Results  Component Value Date   NA 141 07/13/2021   K 3.2 (L) 07/13/2021   CO2 25 07/13/2021   GLUCOSE 124 (H) 07/13/2021   BUN 15 07/13/2021   CREATININE 0.70 07/13/2021   BILITOT 0.4 07/13/2021   ALKPHOS 77 07/13/2021   AST 32 07/13/2021   ALT 30 07/13/2021   PROT 7.8 07/13/2021   ALBUMIN 3.8 07/13/2021   CALCIUM 9.2 07/13/2021   ANIONGAP 10 07/13/2021   No results found for: "CHOL" No results found for: "HDL" No results found for: "LDLCALC" No results found for: "TRIG" No results found for: "CHOLHDL" Lab Results  Component Value Date   HGBA1C 5.5 09/28/2018      Assessment & Plan:   Problem List Items Addressed This Visit       Cardiovascular and Mediastinum   Essential hypertension    BP Readings from Last 1 Encounters:  02/02/22 (!) 154/92  Usually well-controlled with telmisartan-HCTZ 40-12.5 mg QD, elevated today likely due to pain Counseled for compliance with the medications Advised DASH diet and moderate exercise/walking, at least 150 mins/week         Respiratory   Chronic obstructive pulmonary disease (HCC)    Chronic, intermittent dyspnea, worse with exertion and recent pollution Needs to quit smoking Albuterol as needed for now Added Breztri as maintenance inhaler - did not tolerate Trelegy      Relevant Medications   Budeson-Glycopyrrol-Formoterol (BREZTRI AEROSPHERE) 160-9-4.8 MCG/ACT AERO   varenicline (CHANTIX) 0.5 MG tablet   varenicline (CHANTIX CONTINUING MONTH PAK) 1 MG tablet     Nervous and Auditory   Bilateral sciatica    Has chronic low back pain with radicular  symptoms Has history of DDD of lumbar spine Started gabapentin for neuropathic pain      Relevant Medications   gabapentin (NEURONTIN) 100 MG capsule   varenicline (CHANTIX) 0.5 MG tablet   varenicline (CHANTIX  CONTINUING MONTH PAK) 1 MG tablet   Idiopathic peripheral neuropathy   Relevant Medications   gabapentin (NEURONTIN) 100 MG capsule   varenicline (CHANTIX) 0.5 MG tablet   varenicline (CHANTIX CONTINUING MONTH PAK) 1 MG tablet   Cervical radiculopathy    Has bilateral forearm and hand numbness Likely from cervical radiculopathy Check x-ray of cervical spine Started gabapentin 100 mg nightly      Relevant Medications   gabapentin (NEURONTIN) 100 MG capsule   varenicline (CHANTIX) 0.5 MG tablet   varenicline (CHANTIX CONTINUING MONTH PAK) 1 MG tablet   Other Relevant Orders   DG Cervical Spine Complete     Other   Tobacco abuse    Smokes about 0.25 pack/day  Asked about quitting: confirms that he/she currently smokes cigarettes Advise to quit smoking: Educated about QUITTING to reduce the risk of cancer, cardio and cerebrovascular disease. Assess willingness: Unwilling to quit at this time, but is working on cutting back. Assist with counseling and pharmacotherapy: Counseled for 5 minutes and literature provided.  Started Chantix. Arrange for follow up: follow up in 3 months and continue to offer help.      Relevant Medications   varenicline (CHANTIX) 0.5 MG tablet   varenicline (CHANTIX CONTINUING MONTH PAK) 1 MG tablet   Encounter for general adult medical examination with abnormal findings - Primary    Physical exam as documented. Fasting blood tests reviewed. Prefers to wait for Shingrix vaccine.      Other Visit Diagnoses     Need for immunization against influenza       Relevant Orders   Flu Vaccine QUAD 54mo+IM (Fluarix, Fluzone & Alfiuria Quad PF) (Completed)       Meds ordered this encounter  Medications   Budeson-Glycopyrrol-Formoterol (BREZTRI AEROSPHERE) 160-9-4.8 MCG/ACT AERO    Sig: Inhale 2 puffs into the lungs 2 (two) times daily.    Dispense:  10.7 g    Refill:  11   gabapentin (NEURONTIN) 100 MG capsule    Sig: Take 1 capsule (100 mg total) by  mouth at bedtime.    Dispense:  30 capsule    Refill:  3   varenicline (CHANTIX) 0.5 MG tablet    Sig: Take 1 tablet (0.5 mg total) by mouth daily for 3 days, THEN 1 tablet (0.5 mg total) 2 (two) times daily for 4 days.    Dispense:  11 tablet    Refill:  0   varenicline (CHANTIX CONTINUING MONTH PAK) 1 MG tablet    Sig: Take 1 tablet (1 mg total) by mouth 2 (two) times daily.    Dispense:  60 tablet    Refill:  2    Follow-up: Return in about 4 weeks (around 03/02/2022) for Neuropathy and HTN.    Anabel Halon, MD

## 2022-02-02 NOTE — Patient Instructions (Addendum)
Please start taking Gabapentin 100 mg at bedtime.  Please start taking Chantix as prescribed.  Please continue taking medications as prescribed.  Please continue to follow low salt diet and perform moderate exercise/walking at least 150 mins/week.

## 2022-02-02 NOTE — Assessment & Plan Note (Signed)
Physical exam as documented. Fasting blood tests reviewed. Prefers to wait for Shingrix vaccine.

## 2022-02-04 ENCOUNTER — Ambulatory Visit
Admission: EM | Admit: 2022-02-04 | Discharge: 2022-02-04 | Disposition: A | Discharge status: Home / Self Care | Payer: 59 | Attending: Nurse Practitioner | Admitting: Nurse Practitioner

## 2022-02-04 ENCOUNTER — Telehealth: Payer: Self-pay

## 2022-02-04 DIAGNOSIS — L539 Erythematous condition, unspecified: Secondary | ICD-10-CM

## 2022-02-04 DIAGNOSIS — A084 Viral intestinal infection, unspecified: Secondary | ICD-10-CM

## 2022-02-04 MED ORDER — ONDANSETRON 4 MG PO TBDP: 4.0000 mg | ORAL_TABLET | Freq: Three times a day (TID) | ORAL | 0 refills | Status: AC | PRN

## 2022-02-04 MED ORDER — KETOROLAC TROMETHAMINE 30 MG/ML IJ SOLN
30.0000 mg | Freq: Once | INTRAMUSCULAR | Status: AC
  Administered 2022-02-04: 30 mg via INTRAMUSCULAR

## 2022-02-04 NOTE — Discharge Instructions (Addendum)
I suspect the redness in your arm is related to the flu shot.  Please start cool compresses to help with pain and swelling.  This should improve over the next few days.  We have given you a shot of Toradol today to help with pain.  DO NOT take any NSAIDs today.  The diarrhea and vomiting is likely from a stomach bug.  Please try to work on increasing hydration.  You can take the nausea medicine every 8 hours as needed for nausea/vomiting.  This should be improved by the end of the weekend.  If your symptoms worsen, go to the Emergency Room.

## 2022-02-04 NOTE — ED Provider Notes (Signed)
[facility]    CSN: [id number] Arrival date & time: 02/04/22  1211      History   Chief Complaint Chief Complaint  Patient presents with   Rash   Diarrhea    HPI [name] is a 61 y.o. female.   Patient presents for less than 12 hours of diarrhea multiple times since this morning, vomiting multiple episodes since this morning.  She endorses a little bit of blood in the paper when she wipes.  Otherwise, nonbloody stool, nonbloody vomit.  She endorses abdominal pain prior to the diarrhea.  No known fevers, body aches, but has been having chills since this morning.  Reports that she received her flu vaccine at her primary care provider office earlier this week and has a red area on her arm that is very painful to touch.  Also reports there is a rash developing and questions if this is a side effect from the vaccine.  Denies loss of taste or smell.  Reports she is tolerating liquids without difficulty, unable to tolerate solids.  No recent antibiotic use.  Reports she ate at a [place] buffet earlier this week after her flu shot.  Has not take anything for symptoms so far.    Past Medical History:  Diagnosis Date   Anxiety    Arthritis    Cholecystitis 09/11/2017   Constipation    COPD (chronic obstructive pulmonary disease) (HCC)    Family history of adverse reaction to anesthesia    brother was "allergic" to anesthesia - heart stopped beating on the surgery table   Gallbladder sludge    GERD (gastroesophageal reflux disease)    Headache    used to have migraines   Hypertension    Palpitations    Proximal phalanx fracture of finger 03/31/2015   Staph infection     Patient Active Problem List   Diagnosis Date Noted   Bilateral sciatica 02/02/2022   Encounter for general adult medical examination with abnormal findings 02/02/2022   Carpal tunnel syndrome on both sides 02/02/2022   Idiopathic peripheral neuropathy 02/02/2022   Cervical radiculopathy 02/02/2022    Preop examination 09/30/2021   Primary osteoarthritis of both hands 08/19/2021   Chronic obstructive pulmonary disease (HCC) 08/19/2021   Trigger ring finger of left hand 08/19/2021   Chronic idiopathic constipation 08/19/2021   Lymphadenopathy 08/11/2021   Tobacco abuse 09/28/2018   Essential hypertension 09/28/2018    Past Surgical History:  Procedure Laterality Date   CHOLECYSTECTOMY N/A 09/13/2017   Procedure: LAPAROSCOPIC CHOLECYSTECTOMY;  Surgeon: Lucretia Roers, MD;  Location: [facility];  Service: General;  Laterality: N/A;   GROIN DEBRIDEMENT     due to spider bite,    OPEN REDUCTION INTERNAL FIXATION (ORIF) PROXIMAL PHALANX Right 04/07/2015   Procedure: OPEN REDUCTION INTERNAL FIXATION (ORIF) PROXIMAL PHALANX;  Surgeon: Cammy Copa, MD;  Location: [facility];  Service: Orthopedics;  Laterality: Right;   TONSILLECTOMY     TUBAL LIGATION      OB History     Gravida      Para      Term      Preterm      AB      Living  3      SAB      IAB      Ectopic      Multiple      Live Births               Home Medications  Prior to Admission medications   Medication Sig Start Date End Date Taking? Authorizing Provider  ondansetron (ZOFRAN-ODT) 4 MG disintegrating tablet Take 1 tablet (4 mg total) by mouth every 8 (eight) hours as needed for nausea or vomiting. 02/04/22  Yes Valentino Nose, NP  albuterol (VENTOLIN HFA) 108 (90 Base) MCG/ACT inhaler Inhale 2 puffs into the lungs every 6 (six) hours as needed for wheezing or shortness of breath. 08/19/21   Anabel Halon, MD  Budeson-Glycopyrrol-Formoterol (BREZTRI AEROSPHERE) 160-9-4.8 MCG/ACT AERO Inhale 2 puffs into the lungs 2 (two) times daily. 02/02/22   Anabel Halon, MD  gabapentin (NEURONTIN) 100 MG capsule Take 1 capsule (100 mg total) by mouth at bedtime. 02/02/22   Anabel Halon, MD  ibuprofen (ADVIL) 800 MG tablet Take 1 tablet (800 mg total) by mouth every 8 (eight) hours as needed.  12/18/21   Particia Nearing, PA-C  omeprazole (PRILOSEC) 20 MG capsule Take 20 mg by mouth daily.    [provider]  telmisartan-hydrochlorothiazide (MICARDIS HCT) 40-12.5 MG tablet Take 1 tablet by mouth daily. 12/14/21   Anabel Halon, MD  varenicline (CHANTIX CONTINUING MONTH PAK) 1 MG tablet Take 1 tablet (1 mg total) by mouth 2 (two) times daily. 02/02/22   Anabel Halon, MD  varenicline (CHANTIX) 0.5 MG tablet Take 1 tablet (0.5 mg total) by mouth daily for 3 days, THEN 1 tablet (0.5 mg total) 2 (two) times daily for 4 days. 02/02/22 02/09/22  Anabel Halon, MD    Family History Family History  Problem Relation Age of Onset   COPD Mother    Diabetes Father     Social History Social History   Tobacco Use   Smoking status: Some Days    Packs/day: 0.50    Types: Cigarettes   Smokeless tobacco: Never  Vaping Use   Vaping Use: Never used  Substance Use Topics   Alcohol use: Yes    Comment: wine occ   Drug use: No     Allergies   Penicillins   Review of Systems Review of Systems Per HPI  Physical Exam Triage Vital Signs ED Triage Vitals  Enc Vitals Group     BP 02/04/22 1244 (!) 189/89     Pulse Rate 02/04/22 1244 88     Resp 02/04/22 1244 20     Temp 02/04/22 1244 98.2 F (36.8 C)     Temp Source 02/04/22 1244 Oral     SpO2 02/04/22 1244 92 %     Weight --      Height --      Head Circumference --      Peak Flow --      Pain Score 02/04/22 1243 8     Pain Loc --      Pain Edu? --      Excl. in GC? --    No data found.  Updated Vital Signs BP (!) 189/89 (BP Location: Right Arm)   Pulse 88   Temp 98.2 F (36.8 C) (Oral)   Resp 20   SpO2 92%   Visual Acuity Right Eye Distance:   Left Eye Distance:   Bilateral Distance:    Right Eye Near:   Left Eye Near:    Bilateral Near:     Physical Exam Vitals and nursing note reviewed.  Constitutional:      General: She is not in acute distress.    Appearance: Normal appearance.  She is not toxic-appearing.  HENT:  Right Ear: Tympanic membrane, ear canal and external ear normal. There is no impacted cerumen.     Left Ear: Tympanic membrane, ear canal and external ear normal. There is no impacted cerumen.     Mouth/Throat:     Mouth: Mucous membranes are moist.     Pharynx: Oropharynx is clear. No posterior oropharyngeal erythema.  Cardiovascular:     Rate and Rhythm: Normal rate and regular rhythm.  Pulmonary:     Effort: Pulmonary effort is normal. No respiratory distress.     Breath sounds: Normal breath sounds. No wheezing, rhonchi or rales.  Abdominal:     General: Abdomen is flat. Bowel sounds are normal. There is no distension.     Palpations: Abdomen is soft.     Tenderness: There is no abdominal tenderness. There is no right CVA tenderness, left CVA tenderness or guarding.  Musculoskeletal:     Cervical back: Normal range of motion.  Lymphadenopathy:     Cervical: No cervical adenopathy.  Skin:    General: Skin is warm and dry.     Capillary Refill: Capillary refill takes less than 2 seconds.     Coloration: Skin is not jaundiced or pale.     Findings: Erythema present.          Comments: Localized area of erythema to left bicep in approximately area marked which patient reports corresponds to area where she received influenza shot.  No warmth, fluctuance, drainage.  Area is tender to palpation.  No rash appreciated.   Neurological:     Mental Status: She is alert and oriented to person, place, and time.  Psychiatric:        Behavior: Behavior is cooperative.      UC Treatments / Results  Labs (all labs ordered are listed, but only abnormal results are displayed) Labs Reviewed - No data to display  EKG   Radiology No results found.  Procedures Procedures (including critical care time)  Medications Ordered in UC Medications  ketorolac (TORADOL) 30 MG/ML injection 30 mg (30 mg Intramuscular Given 02/04/22 1311)    Initial  Impression / Assessment and Plan / UC Course  I have reviewed the triage vital signs and the nursing notes.  Pertinent labs & imaging results that were available during my care of the patient were reviewed by me and considered in my medical decision making (see chart for details).   Patient is well-appearing, afebrile, not tachycardic, not tachypneic, oxygenating well on room air.  Patient is hypertensive, likely secondary to acute illness.  Patient reports blood pressure is elevated frequently in doctors offices.  She has not taken her blood pressure medication yet today additionally secondary to acute illness.  Localized erythema Suspect localized reaction to influenza vaccine Toradol 30 mg IM given for pain control today in urgent care Discussed starting use of cool compresses Follow-up with primary care provider early next week in the improvement in symptoms  Viral gastroenteritis Suspect nausea/vomiting/diarrhea secondary to viral gastroenteritis Supportive care discussed Prescription sent for Zofran ODT every 8 hours as needed for nausea/vomiting Encourage plenty of hydration Strict ER precautions discussed with worsening symptoms  The patient was given the opportunity to ask questions.  All questions answered to their satisfaction.  The patient is in agreement to this plan.    Final Clinical Impressions(s) / UC Diagnoses   Final diagnoses:  Localized erythema  Viral gastroenteritis     Discharge Instructions      I suspect the redness in your arm  is related to the flu shot.  Please start cool compresses to help with pain and swelling.  This should improve over the next few days.  We have given you a shot of Toradol today to help with pain.  DO NOT take any NSAIDs today.  The diarrhea and vomiting is likely from a stomach bug.  Please try to work on increasing hydration.  You can take the nausea medicine every 8 hours as needed for nausea/vomiting.  This should be improved by  the end of the weekend.  If your symptoms worsen, go to the Emergency Room.        ED Prescriptions     Medication Sig Dispense Auth. Provider   ondansetron (ZOFRAN-ODT) 4 MG disintegrating tablet Take 1 tablet (4 mg total) by mouth every 8 (eight) hours as needed for nausea or vomiting. 20 tablet Valentino Nose, NP      PDMP not reviewed this encounter.   Valentino Nose, NP 02/04/22 [phone]

## 2022-02-04 NOTE — ED Notes (Signed)
Called 2 times in lobby no answered.   Pt called by front desk said she will be in in a moment

## 2022-02-04 NOTE — Telephone Encounter (Signed)
Patient called states since her flu shot her arm is swollen and there is a rash down the back of her arm she is also experiencing vomiting and diarrhea please advise ?

## 2022-02-04 NOTE — ED Triage Notes (Signed)
Pt reports swelling, pain, redness in left arm, nausea, diarrhea and chills after Flu vaccine 2 days ago.

## 2022-02-07 NOTE — Telephone Encounter (Signed)
Patient was seen at urgent care 02/04/22

## 2022-02-08 LAB — LIPID PANEL
Chol/HDL Ratio: 2.8 ratio (ref 0.0–4.4)
Cholesterol, Total: 151 mg/dL (ref 100–199)
HDL: 54 mg/dL (ref >39)
LDL Chol Calc (NIH): 67 mg/dL (ref 0–99)
Triglycerides: 178 mg/dL — ABNORMAL HIGH (ref 0–149)
VLDL Cholesterol Cal: 30 mg/dL (ref 5–40)

## 2022-02-08 LAB — CMP14+EGFR
ALT: 22 IU/L (ref 0–32)
AST: 30 IU/L (ref 0–40)
Albumin/Globulin Ratio: 1.2 (ref 1.2–2.2)
Albumin: 4.2 g/dL (ref 3.9–4.9)
Alkaline Phosphatase: 89 IU/L (ref 44–121)
BUN/Creatinine Ratio: 20 (ref 12–28)
BUN: 11 mg/dL (ref 8–27)
Bilirubin Total: 0.4 mg/dL (ref 0.0–1.2)
CO2: 23 mmol/L (ref 20–29)
Calcium: 9.4 mg/dL (ref 8.7–10.3)
Chloride: 101 mmol/L (ref 96–106)
Creatinine, Ser: 0.55 mg/dL — ABNORMAL LOW (ref 0.57–1.00)
Globulin, Total: 3.4 g/dL (ref 1.5–4.5)
Glucose: 106 mg/dL — ABNORMAL HIGH (ref 70–99)
Potassium: 3.9 mmol/L (ref 3.5–5.2)
Sodium: 140 mmol/L (ref 134–144)
Total Protein: 7.6 g/dL (ref 6.0–8.5)
eGFR: 104 mL/min/{1.73_m2} (ref >59)

## 2022-02-08 LAB — HEMOGLOBIN A1C
Est. average glucose Bld gHb Est-mCnc: 123 mg/dL
Hgb A1c MFr Bld: 5.9 % — ABNORMAL HIGH (ref 4.8–5.6)

## 2022-02-08 LAB — HEPATITIS C ANTIBODY: Hep C Virus Ab: REACTIVE — AB

## 2022-03-02 ENCOUNTER — Encounter: Payer: Self-pay | Admitting: Internal Medicine

## 2022-03-02 ENCOUNTER — Ambulatory Visit (INDEPENDENT_AMBULATORY_CARE_PROVIDER_SITE_OTHER): Payer: 59 | Admitting: Internal Medicine

## 2022-03-02 VITALS — BP 160/88 | HR 88 | Ht 60.0 in | Wt 147.6 lb

## 2022-03-02 DIAGNOSIS — M5412 Radiculopathy, cervical region: Secondary | ICD-10-CM

## 2022-03-02 DIAGNOSIS — M5416 Radiculopathy, lumbar region: Secondary | ICD-10-CM | POA: Insufficient documentation

## 2022-03-02 DIAGNOSIS — F40240 Claustrophobia: Secondary | ICD-10-CM

## 2022-03-02 DIAGNOSIS — I10 Essential (primary) hypertension: Secondary | ICD-10-CM

## 2022-03-02 DIAGNOSIS — J439 Emphysema, unspecified: Secondary | ICD-10-CM | POA: Diagnosis not present

## 2022-03-02 DIAGNOSIS — Z23 Encounter for immunization: Secondary | ICD-10-CM

## 2022-03-02 MED ORDER — LORAZEPAM 1 MG PO TABS: 1.0000 mg | ORAL_TABLET | Freq: Once | ORAL | 0 refills | Status: AC

## 2022-03-02 MED ORDER — GABAPENTIN 300 MG PO CAPS: 300.0000 mg | ORAL_CAPSULE | Freq: Every day | ORAL | 3 refills | Status: DC

## 2022-03-02 MED ORDER — TELMISARTAN-HCTZ 80-25 MG PO TABS: 1.0000 | ORAL_TABLET | Freq: Every day | ORAL | 3 refills | Status: DC

## 2022-03-02 NOTE — Assessment & Plan Note (Signed)
BP Readings from Last 1 Encounters:  03/02/22 (!) 160/88   Uncontrolled with telmisartan-HCTZ 40-12.5 mg QD Increased dose of telmisartan-HCTZ to 80-25 mg QD Counseled for compliance with the medications Advised DASH diet and moderate exercise/walking, at least 150 mins/week

## 2022-03-02 NOTE — Assessment & Plan Note (Addendum)
Has chronic low back pain with radicular symptoms Has history of DDD of lumbar spine Started gabapentin for neuropathic pain, had slight improvement Increased dose of gabapentin to 300 mg nightly Check MRI of lumbar spine as she has had radicular symptoms > 6 weeks

## 2022-03-02 NOTE — Assessment & Plan Note (Signed)
Has bilateral forearm and hand numbness Likely from cervical radiculopathy Check x-ray of cervical spine Increased dose of gabapentin to 300 mg nightly

## 2022-03-02 NOTE — Addendum Note (Signed)
Addended by: Juliane Lack D on: 03/02/2022 02:35 PM   Modules accepted: Orders

## 2022-03-02 NOTE — Patient Instructions (Signed)
Please start taking Telmisartan-HCTZ 80-25 mg instead of 40-12.5 mg as prescribed.  Please start taking Gabapentin 300 mg instead of 100 mg at bedtime.  Please get X-ray of cervical spine at [facility].

## 2022-03-02 NOTE — Assessment & Plan Note (Signed)
Chronic, intermittent dyspnea, worse with exertion and recent pollution Needs to quit smoking Albuterol as needed for now Added Breztri as maintenance inhaler, now improved- did not tolerate Trelegy

## 2022-03-02 NOTE — Progress Notes (Signed)
Established Patient Office Visit  Subjective:  Patient ID: [name], female    DOB: 1960/08/12  Age: 61 y.o. MRN: [id number]  CC:  Chief Complaint  Patient presents with   Follow-up    Patient states she can not rest, in pain all the time    HPI [name] is a 61 y.o. female with past medical history of HTN, COPD and tobacco abuse who presents for f/u of her chronic medical conditions.  HTN: Her BP was elevated in the office today.  She is on telmisartan- HCTZ 40-12.5 mg QD currently.  She currently denies any headache, dizziness, chest pain or palpitations.  She attributes elevated BP to her pain in the hands and legs.   She reports chronic low back pain, which is sharp, radiating to bilateral LE and is associated with numbness of the LE.  She has history of DDD of lumbar spine.  She also reports numbness of the hands and forearms, especially worse at night time.  She was placed on gabapentin 100 mg nightly, which helped her with severe pain, but still complains of pain with radicular symptoms.  COPD: Her dyspnea has improved with Breztri.  She is using albuterol inhaler as needed for dyspnea or wheezing. She has smoking history, currently smokes about 2 cigarettes per day, but states that she used to smoke 1 pack/day.  She was not able to get Chantix due to insurance coverage concern.  Past Medical History:  Diagnosis Date   Anxiety    Arthritis    Cholecystitis 09/11/2017   Constipation    COPD (chronic obstructive pulmonary disease) (HCC)    Family history of adverse reaction to anesthesia    brother was "allergic" to anesthesia - heart stopped beating on the surgery table   Gallbladder sludge    GERD (gastroesophageal reflux disease)    Headache    used to have migraines   Hypertension    Palpitations    Proximal phalanx fracture of finger 03/31/2015   Staph infection     Past Surgical History:  Procedure Laterality Date   CHOLECYSTECTOMY N/A 09/13/2017    Procedure: LAPAROSCOPIC CHOLECYSTECTOMY;  Surgeon: Virl Cagey, MD;  Location: [facility];  Service: General;  Laterality: N/A;   GROIN DEBRIDEMENT     due to spider bite,    OPEN REDUCTION INTERNAL FIXATION (ORIF) PROXIMAL PHALANX Right 04/07/2015   Procedure: OPEN REDUCTION INTERNAL FIXATION (ORIF) PROXIMAL PHALANX;  Surgeon: Meredith Pel, MD;  Location: [facility];  Service: Orthopedics;  Laterality: Right;   TONSILLECTOMY     TUBAL LIGATION      Family History  Problem Relation Age of Onset   COPD Mother    Diabetes Father     Social History   Socioeconomic History   Marital status: Married    Spouse name: Not on file   Number of children: Not on file   Years of education: Not on file   Highest education level: Not on file  Occupational History   Not on file  Tobacco Use   Smoking status: Some Days    Packs/day: 0.50    Types: Cigarettes   Smokeless tobacco: Never  Vaping Use   Vaping Use: Never used  Substance and Sexual Activity   Alcohol use: Yes    Comment: wine occ   Drug use: No   Sexual activity: Not on file  Other Topics Concern   Not on file  Social History Narrative   Not on file  Social Determinants of Health   Financial Resource Strain: Not on file  Food Insecurity: Not on file  Transportation Needs: Not on file  Physical Activity: Not on file  Stress: Not on file  Social Connections: Not on file  Intimate Partner Violence: Not on file    Outpatient Medications Prior to Visit  Medication Sig Dispense Refill   albuterol (VENTOLIN HFA) 108 (90 Base) MCG/ACT inhaler Inhale 2 puffs into the lungs every 6 (six) hours as needed for wheezing or shortness of breath. 18 g 5   Budeson-Glycopyrrol-Formoterol (BREZTRI AEROSPHERE) 160-9-4.8 MCG/ACT AERO Inhale 2 puffs into the lungs 2 (two) times daily. 10.7 g 11   ibuprofen (ADVIL) 800 MG tablet Take 1 tablet (800 mg total) by mouth every 8 (eight) hours as needed. 30 tablet 0   omeprazole (PRILOSEC)  20 MG capsule Take 20 mg by mouth daily.     ondansetron (ZOFRAN-ODT) 4 MG disintegrating tablet Take 1 tablet (4 mg total) by mouth every 8 (eight) hours as needed for nausea or vomiting. 20 tablet 0   varenicline (CHANTIX CONTINUING MONTH PAK) 1 MG tablet Take 1 tablet (1 mg total) by mouth 2 (two) times daily. 60 tablet 2   gabapentin (NEURONTIN) 100 MG capsule Take 1 capsule (100 mg total) by mouth at bedtime. 30 capsule 3   telmisartan-hydrochlorothiazide (MICARDIS HCT) 40-12.5 MG tablet Take 1 tablet by mouth daily. 30 tablet 2   No facility-administered medications prior to visit.    Allergies  Allergen Reactions   Penicillins Swelling    Throat swelling Has patient had a PCN reaction causing immediate rash, facial/tongue/throat swelling, SOB or lightheadedness with hypotension: Yes Has patient had a PCN reaction causing severe rash involving mucus membranes or skin necrosis: No Has patient had a PCN reaction that required hospitalization  ED visit but no hospitalization Has patient had a PCN reaction occurring within the last 10 years: No If all of the above answers are "NO", then may proceed with Cephalosporin use.    ROS Review of Systems  Constitutional:  Negative for chills and fever.  HENT:  Negative for congestion, sinus pressure, sinus pain and sore throat.   Eyes:  Negative for pain and discharge.  Respiratory:  Positive for shortness of breath. Negative for cough.   Cardiovascular:  Negative for chest pain and palpitations.  Gastrointestinal:  Positive for constipation. Negative for abdominal pain, diarrhea, nausea and vomiting.  Endocrine: Negative for polydipsia and polyuria.  Genitourinary:  Negative for dysuria and hematuria.  Musculoskeletal:  Positive for arthralgias, back pain and neck pain. Negative for neck stiffness.  Skin:  Negative for rash.  Neurological:  Positive for numbness. Negative for dizziness and weakness.  Psychiatric/Behavioral:  Negative for  agitation and behavioral problems.       Objective:    Physical Exam Vitals reviewed.  Constitutional:      General: She is not in acute distress.    Appearance: She is not diaphoretic.  HENT:     Head: Normocephalic and atraumatic.     Nose: Nose normal.     Mouth/Throat:     Mouth: Mucous membranes are moist.  Eyes:     General: No scleral icterus.    Extraocular Movements: Extraocular movements intact.  Cardiovascular:     Rate and Rhythm: Normal rate and regular rhythm.     Pulses: Normal pulses.     Heart sounds: Normal heart sounds. No murmur heard. Pulmonary:     Breath sounds: Normal breath sounds.  No wheezing or rales.  Abdominal:     Palpations: Abdomen is soft.     Tenderness: There is no abdominal tenderness.  Musculoskeletal:     Right hand: Tenderness present.     Left hand: Tenderness present.     Cervical back: Neck supple. No tenderness.     Right lower leg: No edema.     Left lower leg: No edema.     Comments: [place] and Bouchard nodes bilaterally Trigger finger of left ring finger  Skin:    General: Skin is warm.     Findings: No rash.  Neurological:     General: No focal deficit present.     Mental Status: She is alert and oriented to person, place, and time.     Sensory: Sensory deficit (B/l hands) present.     Motor: No weakness.  Psychiatric:        Mood and Affect: Mood normal.        Behavior: Behavior normal.     BP (!) 160/88 (BP Location: Right Arm, Cuff Size: Normal)   Pulse 88   Ht 5' (1.524 m)   Wt 147 lb 9.6 oz (67 kg)   SpO2 90%   BMI 28.83 kg/m  Wt Readings from Last 3 Encounters:  03/02/22 147 lb 9.6 oz (67 kg)  02/02/22 144 lb 3.2 oz (65.4 kg)  01/11/22 144 lb (65.3 kg)    Lab Results  Component Value Date   TSH 3.372 09/28/2018   Lab Results  Component Value Date   WBC 8.9 07/13/2021   HGB 15.9 (H) 07/13/2021   HCT 48.7 (H) 07/13/2021   MCV 90.9 07/13/2021   PLT 214 07/13/2021   Lab Results  Component  Value Date   NA 140 02/02/2022   K 3.9 02/02/2022   CO2 23 02/02/2022   GLUCOSE 106 (H) 02/02/2022   BUN 11 02/02/2022   CREATININE 0.55 (L) 02/02/2022   BILITOT 0.4 02/02/2022   ALKPHOS 89 02/02/2022   AST 30 02/02/2022   ALT 22 02/02/2022   PROT 7.6 02/02/2022   ALBUMIN 4.2 02/02/2022   CALCIUM 9.4 02/02/2022   ANIONGAP 10 07/13/2021   EGFR 104 02/02/2022   Lab Results  Component Value Date   CHOL 151 02/02/2022   Lab Results  Component Value Date   HDL 54 02/02/2022   Lab Results  Component Value Date   LDLCALC 67 02/02/2022   Lab Results  Component Value Date   TRIG 178 (H) 02/02/2022   Lab Results  Component Value Date   CHOLHDL 2.8 02/02/2022   Lab Results  Component Value Date   HGBA1C 5.9 (H) 02/02/2022      Assessment & Plan:   Problem List Items Addressed This Visit       Cardiovascular and Mediastinum   Essential hypertension - Primary    BP Readings from Last 1 Encounters:  03/02/22 (!) 160/88  Uncontrolled with telmisartan-HCTZ 40-12.5 mg QD Increased dose of telmisartan-HCTZ to 80-25 mg QD Counseled for compliance with the medications Advised DASH diet and moderate exercise/walking, at least 150 mins/week      Relevant Medications   telmisartan-hydrochlorothiazide (MICARDIS HCT) 80-25 MG tablet     Respiratory   Chronic obstructive pulmonary disease (HCC)    Chronic, intermittent dyspnea, worse with exertion and recent pollution Needs to quit smoking Albuterol as needed for now Added Breztri as maintenance inhaler, now improved- did not tolerate Trelegy        Nervous and Auditory  Cervical radiculopathy    Has bilateral forearm and hand numbness Likely from cervical radiculopathy Check x-ray of cervical spine Increased dose of gabapentin to 300 mg nightly      Relevant Medications   gabapentin (NEURONTIN) 300 MG capsule   LORazepam (ATIVAN) 1 MG tablet   Lumbar radiculopathy, chronic    Has chronic low back pain with  radicular symptoms Has history of DDD of lumbar spine Started gabapentin for neuropathic pain, had slight improvement Increased dose of gabapentin to 300 mg nightly Check MRI of lumbar spine as she has had radicular symptoms > 6 weeks      Relevant Medications   gabapentin (NEURONTIN) 300 MG capsule   LORazepam (ATIVAN) 1 MG tablet   Other Relevant Orders   MR Lumbar Spine Wo Contrast   Other Visit Diagnoses     Claustrophobia       Relevant Medications   LORazepam (ATIVAN) 1 MG tablet       Meds ordered this encounter  Medications   gabapentin (NEURONTIN) 300 MG capsule    Sig: Take 1 capsule (300 mg total) by mouth at bedtime.    Dispense:  30 capsule    Refill:  3    Dose change   LORazepam (ATIVAN) 1 MG tablet    Sig: Take 1 tablet (1 mg total) by mouth once for 1 dose. Take it 1 hour before the MRI.    Dispense:  1 tablet    Refill:  0   telmisartan-hydrochlorothiazide (MICARDIS HCT) 80-25 MG tablet    Sig: Take 1 tablet by mouth daily.    Dispense:  30 tablet    Refill:  3    Follow-up: Return in about 2 months (around 05/03/2022) for HTN and back pain.    Lindell Spar, MD

## 2022-03-07 ENCOUNTER — Telehealth: Payer: Self-pay

## 2022-03-07 ENCOUNTER — Other Ambulatory Visit: Payer: Self-pay | Admitting: Internal Medicine

## 2022-03-07 ENCOUNTER — Telehealth: Payer: Self-pay | Admitting: Internal Medicine

## 2022-03-07 DIAGNOSIS — R252 Cramp and spasm: Secondary | ICD-10-CM

## 2022-03-07 DIAGNOSIS — I10 Essential (primary) hypertension: Secondary | ICD-10-CM

## 2022-03-07 NOTE — Telephone Encounter (Signed)
lmtrc

## 2022-03-07 NOTE — Telephone Encounter (Signed)
Patient advised.

## 2022-03-07 NOTE — Telephone Encounter (Signed)
Pt returning call

## 2022-03-08 LAB — BASIC METABOLIC PANEL
BUN/Creatinine Ratio: 18 (ref 12–28)
BUN: 13 mg/dL (ref 8–27)
CO2: 26 mmol/L (ref 20–29)
Calcium: 8.9 mg/dL (ref 8.7–10.3)
Chloride: 105 mmol/L (ref 96–106)
Creatinine, Ser: 0.71 mg/dL (ref 0.57–1.00)
Glucose: 97 mg/dL (ref 70–99)
Potassium: 4.4 mmol/L (ref 3.5–5.2)
Sodium: 146 mmol/L — ABNORMAL HIGH (ref 134–144)
eGFR: 97 mL/min/{1.73_m2} (ref >59)

## 2022-03-08 LAB — MAGNESIUM: Magnesium: 1.6 mg/dL (ref 1.6–2.3)

## 2022-03-08 NOTE — Telephone Encounter (Signed)
Patient retuning call to nurse

## 2022-03-08 NOTE — Progress Notes (Signed)
Patient advised.

## 2022-03-08 NOTE — Telephone Encounter (Signed)
Spoke to patient

## 2022-03-24 ENCOUNTER — Ambulatory Visit (HOSPITAL_COMMUNITY): Admission: RE | Admit: 2022-03-24 | Payer: 59 | Source: Ambulatory Visit

## 2022-05-03 ENCOUNTER — Ambulatory Visit: Payer: 59 | Admitting: Internal Medicine

## 2022-09-19 ENCOUNTER — Ambulatory Visit: Payer: Self-pay | Admitting: Internal Medicine

## 2023-01-29 ENCOUNTER — Encounter: Payer: Self-pay | Admitting: Emergency Medicine

## 2023-01-29 ENCOUNTER — Other Ambulatory Visit: Payer: Self-pay

## 2023-01-29 ENCOUNTER — Ambulatory Visit
Admission: EM | Admit: 2023-01-29 | Discharge: 2023-01-29 | Disposition: A | Discharge status: Home / Self Care | Payer: No Typology Code available for payment source | Attending: Family Medicine | Admitting: Family Medicine

## 2023-01-29 DIAGNOSIS — J22 Unspecified acute lower respiratory infection: Secondary | ICD-10-CM | POA: Diagnosis not present

## 2023-01-29 DIAGNOSIS — R1033 Periumbilical pain: Secondary | ICD-10-CM

## 2023-01-29 DIAGNOSIS — Z8719 Personal history of other diseases of the digestive system: Secondary | ICD-10-CM

## 2023-01-29 DIAGNOSIS — J441 Chronic obstructive pulmonary disease with (acute) exacerbation: Secondary | ICD-10-CM | POA: Diagnosis not present

## 2023-01-29 DIAGNOSIS — L02214 Cutaneous abscess of groin: Secondary | ICD-10-CM | POA: Diagnosis not present

## 2023-01-29 DIAGNOSIS — Z9889 Other specified postprocedural states: Secondary | ICD-10-CM

## 2023-01-29 MED ORDER — DOXYCYCLINE HYCLATE 100 MG PO CAPS: 100.0000 mg | ORAL_CAPSULE | Freq: Two times a day (BID) | ORAL | 0 refills | Status: DC

## 2023-01-29 MED ORDER — ALBUTEROL SULFATE HFA 108 (90 BASE) MCG/ACT IN AERS: 2.0000 | INHALATION_SPRAY | RESPIRATORY_TRACT | 0 refills | Status: DC | PRN

## 2023-01-29 MED ORDER — IPRATROPIUM-ALBUTEROL 0.5-2.5 (3) MG/3ML IN SOLN
3.0000 mL | Freq: Once | RESPIRATORY_TRACT | Status: AC
  Administered 2023-01-29: 3 mL via RESPIRATORY_TRACT

## 2023-01-29 MED ORDER — PREDNISONE 20 MG PO TABS: 40.0000 mg | ORAL_TABLET | Freq: Every day | ORAL | 0 refills | Status: DC

## 2023-01-29 NOTE — ED Provider Notes (Signed)
[facility]    CSN: [id number] Arrival date & time: 01/29/23  0809      History   Chief Complaint Chief Complaint  Patient presents with   Cough    HPI [name] is a 62 y.o. female.   Patient presenting today with 5-day history of progressively worsening cough, chest tightness, wheezing, shortness of breath.  States she is coughing up thick mucus sometimes causing her to vomit.  Using her Breztri, albuterol and over-the-counter cough medicine with no relief.  History of COPD.  She is also having a very painful bump to the right groin region that she noticed several days ago.  She is unsure if she got an insect bite or what may have happened.  Denies drainage from the area, fever, chills.  She also notes that she fell several days ago and thinks she tore her hernia repair near her umbilical region.  She states she has felt a hard bump there and has had significant pain.  Denies discoloration, nausea, vomiting, bowel changes.    Past Medical History:  Diagnosis Date   Anxiety    Arthritis    Cholecystitis 09/11/2017   Constipation    COPD (chronic obstructive pulmonary disease) (HCC)    Family history of adverse reaction to anesthesia    brother was "allergic" to anesthesia - heart stopped beating on the surgery table   Gallbladder sludge    GERD (gastroesophageal reflux disease)    Headache    used to have migraines   Hypertension    Palpitations    Proximal phalanx fracture of finger 03/31/2015   Staph infection     Patient Active Problem List   Diagnosis Date Noted   Lumbar radiculopathy, chronic 03/02/2022   Bilateral sciatica 02/02/2022   Encounter for general adult medical examination with abnormal findings 02/02/2022   Carpal tunnel syndrome on both sides 02/02/2022   Idiopathic peripheral neuropathy 02/02/2022   Cervical radiculopathy 02/02/2022   Preop examination 09/30/2021   Primary osteoarthritis of both hands 08/19/2021   Chronic  obstructive pulmonary disease (HCC) 08/19/2021   Trigger ring finger of left hand 08/19/2021   Chronic idiopathic constipation 08/19/2021   Lymphadenopathy 08/11/2021   Tobacco abuse 09/28/2018   Essential hypertension 09/28/2018    Past Surgical History:  Procedure Laterality Date   CHOLECYSTECTOMY N/A 09/13/2017   Procedure: LAPAROSCOPIC CHOLECYSTECTOMY;  Surgeon: Lucretia Roers, MD;  Location: [facility];  Service: General;  Laterality: N/A;   GROIN DEBRIDEMENT     due to spider bite,    OPEN REDUCTION INTERNAL FIXATION (ORIF) PROXIMAL PHALANX Right 04/07/2015   Procedure: OPEN REDUCTION INTERNAL FIXATION (ORIF) PROXIMAL PHALANX;  Surgeon: Cammy Copa, MD;  Location: [facility];  Service: Orthopedics;  Laterality: Right;   TONSILLECTOMY     TUBAL LIGATION      OB History     Gravida      Para      Term      Preterm      AB      Living  3      SAB      IAB      Ectopic      Multiple      Live Births               Home Medications    Prior to Admission medications   Medication Sig Start Date End Date Taking? Authorizing Provider  albuterol (VENTOLIN HFA) 108 (90 Base) MCG/ACT inhaler Inhale  2 puffs into the lungs every 4 (four) hours as needed. 01/29/23  Yes Particia Nearing, PA-C  doxycycline (VIBRAMYCIN) 100 MG capsule Take 1 capsule (100 mg total) by mouth 2 (two) times daily. 01/29/23  Yes Particia Nearing, PA-C  predniSONE (DELTASONE) 20 MG tablet Take 2 tablets (40 mg total) by mouth daily with breakfast. 01/29/23  Yes Particia Nearing, PA-C  albuterol (VENTOLIN HFA) 108 (90 Base) MCG/ACT inhaler Inhale 2 puffs into the lungs every 6 (six) hours as needed for wheezing or shortness of breath. 08/19/21   Anabel Halon, MD  Budeson-Glycopyrrol-Formoterol (BREZTRI AEROSPHERE) 160-9-4.8 MCG/ACT AERO Inhale 2 puffs into the lungs 2 (two) times daily. 02/02/22   Anabel Halon, MD  gabapentin (NEURONTIN) 300 MG capsule Take 1 capsule (300  mg total) by mouth at bedtime. 03/02/22   Anabel Halon, MD  ibuprofen (ADVIL) 800 MG tablet Take 1 tablet (800 mg total) by mouth every 8 (eight) hours as needed. 12/18/21   Particia Nearing, PA-C  omeprazole (PRILOSEC) 20 MG capsule Take 20 mg by mouth daily.    [provider]  ondansetron (ZOFRAN-ODT) 4 MG disintegrating tablet Take 1 tablet (4 mg total) by mouth every 8 (eight) hours as needed for nausea or vomiting. 02/04/22   Valentino Nose, NP  telmisartan-hydrochlorothiazide (MICARDIS HCT) 80-25 MG tablet Take 1 tablet by mouth daily. 03/02/22   Anabel Halon, MD  varenicline (CHANTIX CONTINUING MONTH PAK) 1 MG tablet Take 1 tablet (1 mg total) by mouth 2 (two) times daily. 02/02/22   Anabel Halon, MD    Family History Family History  Problem Relation Age of Onset   COPD Mother    Diabetes Father     Social History Social History   Tobacco Use   Smoking status: Some Days    Current packs/day: 0.50    Types: Cigarettes   Smokeless tobacco: Never  Vaping Use   Vaping status: Never Used  Substance Use Topics   Alcohol use: Yes    Comment: wine occ   Drug use: No     Allergies   Penicillins   Review of Systems Review of Systems Per HPI  Physical Exam Triage Vital Signs ED Triage Vitals  Encounter Vitals Group     BP 01/29/23 0828 (!) 209/122     Systolic BP Percentile --      Diastolic BP Percentile --      Pulse Rate 01/29/23 0828 86     Resp 01/29/23 0828 20     Temp 01/29/23 0828 97.7 F (36.5 C)     Temp Source 01/29/23 0828 Oral     SpO2 01/29/23 0828 93 %     Weight --      Height --      Head Circumference --      Peak Flow --      Pain Score 01/29/23 0831 9     Pain Loc --      Pain Education --      Exclude from Growth Chart --    No data found.  Updated Vital Signs BP (!) 209/122 (BP Location: Right Arm) Comment: reports hx of htn, has not taken bp meds this am. repeat result, 2 different cuffs.  Pulse 86   Temp  97.7 F (36.5 C) (Oral)   Resp 20   SpO2 93%   Visual Acuity Right Eye Distance:   Left Eye Distance:   Bilateral Distance:  Right Eye Near:   Left Eye Near:    Bilateral Near:     Physical Exam Vitals and nursing note reviewed. Exam conducted with a chaperone present.  Constitutional:      Appearance: Normal appearance. She is not ill-appearing.  HENT:     Head: Atraumatic.     Nose: Nose normal.     Mouth/Throat:     Mouth: Mucous membranes are moist.     Pharynx: Oropharynx is clear. No posterior oropharyngeal erythema.  Eyes:     Extraocular Movements: Extraocular movements intact.     Conjunctiva/sclera: Conjunctivae normal.  Cardiovascular:     Rate and Rhythm: Normal rate and regular rhythm.     Heart sounds: Normal heart sounds.  Pulmonary:     Effort: Pulmonary effort is normal. No respiratory distress.     Breath sounds: Wheezing present.  Abdominal:     General: Bowel sounds are normal. There is no distension.     Palpations: Abdomen is soft.     Tenderness: There is abdominal tenderness. There is no right CVA tenderness, left CVA tenderness or guarding.     Comments: Possible area of firmness to the left of the umbilicus, tender to palpation  Musculoskeletal:        General: Normal range of motion.     Cervical back: Normal range of motion and neck supple.  Skin:    General: Skin is warm and dry.     Comments: Small erythematous pustular lesion, significantly tender to palpation but no fluctuance induration or active drainage to the right groin fold  Neurological:     Mental Status: She is alert and oriented to person, place, and time.     Motor: No weakness.     Gait: Gait normal.  Psychiatric:        Mood and Affect: Mood normal.        Thought Content: Thought content normal.        Judgment: Judgment normal.      UC Treatments / Results  Labs (all labs ordered are listed, but only abnormal results are displayed) Labs Reviewed - No data to  display  EKG   Radiology No results found.  Procedures Procedures (including critical care time)  Medications Ordered in UC Medications  ipratropium-albuterol (DUONEB) 0.5-2.5 (3) MG/3ML nebulizer solution 3 mL (3 mLs Nebulization Given 01/29/23 0910)    Initial Impression / Assessment and Plan / UC Course  I have reviewed the triage vital signs and the nursing notes.  Pertinent labs & imaging results that were available during my care of the patient were reviewed by me and considered in my medical decision making (see chart for details).     Treat with doxycycline for very early abscess forming in the right groin.  This should also cover for lower respiratory infection which will also treat with prednisone, albuterol.  Discussed warm compresses, return precautions.  Regarding the concern for her hernia repair, discussed following up with primary care as soon as possible for further evaluation of this as we do not have ultrasound imaging to further evaluate this.  ED for worsening symptoms in the meantime.  Final Clinical Impressions(s) / UC Diagnoses   Final diagnoses:  Lower respiratory infection  COPD exacerbation (HCC)  Groin abscess  Periumbilical abdominal pain  History of hernia repair     Discharge Instructions      I have given you antibiotics and steroids to help with your respiratory infection and the antibiotics will  also cover for the developing skin infection in the groin region.  You may also use warm compresses and Neosporin to that area.  I have also sent an albuterol inhaler to use every 4 hours as needed for wheezing and chest tightness.  Continue taking your Breztri and follow-up with primary care for further evaluation into the possible hernia concerned that you have and lung recheck    ED Prescriptions     Medication Sig Dispense Auth. Provider   predniSONE (DELTASONE) 20 MG tablet Take 2 tablets (40 mg total) by mouth daily with breakfast. 10 tablet  Particia Nearing, PA-C   doxycycline (VIBRAMYCIN) 100 MG capsule Take 1 capsule (100 mg total) by mouth 2 (two) times daily. 20 capsule Particia Nearing, [place]   albuterol (VENTOLIN HFA) 108 (90 Base) MCG/ACT inhaler Inhale 2 puffs into the lungs every 4 (four) hours as needed. 18 g Particia Nearing, [place]      PDMP not reviewed this encounter.   Particia Nearing, [place] 01/29/23 1445

## 2023-01-29 NOTE — ED Triage Notes (Signed)
Pt reports cough, chest congestion, dyspnea x4-5 days.   Pt also reports "pelvic pain from possible insect bite" for last several days as well.

## 2023-01-29 NOTE — Discharge Instructions (Signed)
I have given you antibiotics and steroids to help with your respiratory infection and the antibiotics will also cover for the developing skin infection in the groin region.  You may also use warm compresses and Neosporin to that area.  I have also sent an albuterol inhaler to use every 4 hours as needed for wheezing and chest tightness.  Continue taking your Breztri and follow-up with primary care for further evaluation into the possible hernia concerned that you have and lung recheck

## 2023-03-05 ENCOUNTER — Emergency Department (HOSPITAL_COMMUNITY): Payer: No Typology Code available for payment source

## 2023-03-05 ENCOUNTER — Emergency Department (HOSPITAL_COMMUNITY)
Admission: EM | Admit: 2023-03-05 | Discharge: 2023-03-06 | Disposition: A | Discharge status: Home / Self Care | Payer: No Typology Code available for payment source | Attending: Emergency Medicine | Admitting: Emergency Medicine

## 2023-03-05 ENCOUNTER — Encounter (HOSPITAL_COMMUNITY): Payer: Self-pay

## 2023-03-05 ENCOUNTER — Other Ambulatory Visit: Payer: Self-pay

## 2023-03-05 DIAGNOSIS — R051 Acute cough: Secondary | ICD-10-CM | POA: Insufficient documentation

## 2023-03-05 DIAGNOSIS — I10 Essential (primary) hypertension: Secondary | ICD-10-CM | POA: Insufficient documentation

## 2023-03-05 DIAGNOSIS — Z1152 Encounter for screening for COVID-19: Secondary | ICD-10-CM | POA: Diagnosis not present

## 2023-03-05 DIAGNOSIS — F17210 Nicotine dependence, cigarettes, uncomplicated: Secondary | ICD-10-CM | POA: Diagnosis not present

## 2023-03-05 DIAGNOSIS — R0602 Shortness of breath: Secondary | ICD-10-CM | POA: Diagnosis present

## 2023-03-05 DIAGNOSIS — J449 Chronic obstructive pulmonary disease, unspecified: Secondary | ICD-10-CM | POA: Insufficient documentation

## 2023-03-05 DIAGNOSIS — R102 Pelvic and perineal pain: Secondary | ICD-10-CM | POA: Insufficient documentation

## 2023-03-05 MED ORDER — OXYCODONE HCL 5 MG PO TABS
5.0000 mg | ORAL_TABLET | Freq: Once | ORAL | Status: AC
  Administered 2023-03-06: 5 mg via ORAL
  Filled 2023-03-05: qty 1

## 2023-03-05 MED ORDER — ALBUTEROL SULFATE HFA 108 (90 BASE) MCG/ACT IN AERS
2.0000 | INHALATION_SPRAY | RESPIRATORY_TRACT | Status: DC | PRN
  Administered 2023-03-06: 2 via RESPIRATORY_TRACT
  Filled 2023-03-05: qty 6.7

## 2023-03-05 MED ORDER — HYDRALAZINE HCL 25 MG PO TABS
25.0000 mg | ORAL_TABLET | Freq: Once | ORAL | Status: AC
  Administered 2023-03-06: 25 mg via ORAL
  Filled 2023-03-05: qty 1

## 2023-03-05 NOTE — ED Notes (Signed)
Patient transported to X-ray 

## 2023-03-05 NOTE — ED Provider Notes (Signed)
[facility] Emergency Department Provider Note MRN:  [id number]  Arrival date & time: 03/06/23     Chief Complaint   Pelvic pain History of Present Illness   [name] is a 62 y.o. year-old female with a history of COPD presenting to the ED with chief complaint of pelvic pain and shortness of breath.  Persistent pelvic pain for the past month.  Described as a bone pain deep in the pelvis.  Denies vaginal irritation or discharge or vaginal pain.  Pain is getting worse and keeping her from getting around as easily.  Persistent cough for the past few weeks despite prescription for Lasix and steroids.  Endorsing continued fever for the past few weeks on and off.  Mild shortness of breath especially with trying to ambulate, denies chest pain.  Review of Systems  A thorough review of systems was obtained and all systems are negative except as noted in the HPI and PMH.   Patient's Health History    Past Medical History:  Diagnosis Date   Anxiety    Arthritis    Cholecystitis 09/11/2017   Constipation    COPD (chronic obstructive pulmonary disease) (HCC)    Family history of adverse reaction to anesthesia    brother was "allergic" to anesthesia - heart stopped beating on the surgery table   Gallbladder sludge    GERD (gastroesophageal reflux disease)    Headache    used to have migraines   Hypertension    Palpitations    Proximal phalanx fracture of finger 03/31/2015   Staph infection     Past Surgical History:  Procedure Laterality Date   CHOLECYSTECTOMY N/A 09/13/2017   Procedure: LAPAROSCOPIC CHOLECYSTECTOMY;  Surgeon: Lucretia Roers, MD;  Location: [facility];  Service: General;  Laterality: N/A;   GROIN DEBRIDEMENT     due to spider bite,    OPEN REDUCTION INTERNAL FIXATION (ORIF) PROXIMAL PHALANX Right 04/07/2015   Procedure: OPEN REDUCTION INTERNAL FIXATION (ORIF) PROXIMAL PHALANX;  Surgeon: Cammy Copa, MD;  Location: [facility];  Service:  Orthopedics;  Laterality: Right;   TONSILLECTOMY     TUBAL LIGATION      Family History  Problem Relation Age of Onset   COPD Mother    Diabetes Father     Social History   Socioeconomic History   Marital status: Married    Spouse name: Not on file   Number of children: Not on file   Years of education: Not on file   Highest education level: Not on file  Occupational History   Not on file  Tobacco Use   Smoking status: Some Days    Current packs/day: 0.50    Types: Cigarettes   Smokeless tobacco: Never  Vaping Use   Vaping status: Never Used  Substance and Sexual Activity   Alcohol use: Yes    Comment: wine occ   Drug use: No   Sexual activity: Not on file  Other Topics Concern   Not on file  Social History Narrative   Not on file   Social Determinants of Health   Financial Resource Strain: Not on file  Food Insecurity: Not on file  Transportation Needs: Not on file  Physical Activity: Not on file  Stress: Not on file  Social Connections: Not on file  Intimate Partner Violence: Not on file     Physical Exam   Vitals:   03/05/23 2315 03/06/23 0035  BP: (!) 192/174   Pulse: 83   Resp: Marland Kitchen)  24   Temp:    SpO2: 90% 92%    CONSTITUTIONAL: Chronically ill-appearing, NAD NEURO/PSYCH:  Alert and oriented x 3, no focal deficits EYES:  eyes equal and reactive ENT/NECK:  no LAD, no JVD CARDIO: Regular rate, well-perfused, normal S1 and S2 PULM:  CTAB no wheezing or rhonchi GI/GU:  non-distended, non-tender MSK/SPINE:  No gross deformities, no edema SKIN:  no rash, atraumatic   *Additional and/or pertinent findings included in MDM below  Diagnostic and Interventional Summary    EKG Interpretation Date/Time:  Sunday March 05 2023 22:29:38 EST Ventricular Rate:  77 PR Interval:  141 QRS Duration:  90 QT Interval:  457 QTC Calculation: 518 R Axis:   86  Text Interpretation: Sinus rhythm Borderline right axis deviation Consider left ventricular  hypertrophy Nonspecific T abnrm, anterolateral leads Prolonged QT interval No significant change was found Confirmed by Brooklen Runquist ([id number]) on 03/06/2023 12:43:19 AM       Labs Reviewed  CBC - Abnormal; Notable for the following components:      Result Value   Hemoglobin 15.2 (*)    HCT 46.2 (*)    All other components within normal limits  COMPREHENSIVE METABOLIC PANEL - Abnormal; Notable for the following components:   Glucose, Bld 126 (*)    AST 44 (*)    All other components within normal limits  RESP PANEL BY RT-PCR (RSV, FLU A&B, COVID)  [id number]  LIPASE, BLOOD  URINALYSIS, ROUTINE W REFLEX MICROSCOPIC  TROPONIN I (HIGH SENSITIVITY)  TROPONIN I (HIGH SENSITIVITY)    CT ABDOMEN PELVIS WO CONTRAST  Final Result    DG Chest 2 View  Final Result      Medications  albuterol (VENTOLIN HFA) 108 (90 Base) MCG/ACT inhaler 2 puff (2 puffs Inhalation Given 03/06/23 0031)  hydrALAZINE (APRESOLINE) tablet 25 mg (25 mg Oral Given 03/06/23 0013)  oxyCODONE (Oxy IR/ROXICODONE) immediate release tablet 5 mg (5 mg Oral Given 03/06/23 0013)     Procedures  /  Critical Care Procedures  ED Course and Medical Decision Making  Initial Impression and Ddx Differential diagnosis includes pneumonia, underlying intra-abdominal malignancy, AAA, appendicitis.  Past medical/surgical history that increases complexity of ED encounter: COPD  Interpretation of Diagnostics I personally reviewed the EKG and my interpretation is as follows: Sinus rhythm without concerning ischemic findings  Labs reassuring with no significant blood count or electrolyte disturbance.  Troponin negative.  Chest x-ray unremarkable, CT abdomen without emergent findings.  Patient Reassessment and Ultimate Disposition/Management     Patient looks well on reassessment, blood pressure improving, appropriate for discharge with follow-up.  Patient management required discussion with the following services or consulting groups:   None  Complexity of Problems Addressed Acute illness or injury that poses threat of life of bodily function  Additional Data Reviewed and Analyzed Further history obtained from: Further history from spouse/family member  Additional Factors Impacting ED Encounter Risk Prescriptions  Kendell Sagraves M. Teran Daughenbaugh, MD [facility] [email or web address]  Final Clinical Impressions(s) / ED Diagnoses     ICD-10-CM   1. Pelvic pain  R10.2     2. Acute cough  R05.1       ED Discharge Orders          Ordered    azithromycin (ZITHROMAX) 250 MG tablet        12 /09/24 0135             Discharge Instructions Discussed with and Provided to Patient:  Discharge Instructions      You were evaluated in the Emergency Department and after careful evaluation, we did not find any emergent condition requiring admission or further testing in the hospital.  Your exam/testing today is overall reassuring.  Recommend follow-up with your primary care doctor to discuss your pelvic pain and further testing.  Recommend use of the azithromycin at home for your cough, continued use of inhalers at home.  Please return to the Emergency Department if you experience any worsening of your condition.   Thank you for allowing [place] to be a part of your care.       Sabas Sous, MD 03/06/23 [phone]

## 2023-03-05 NOTE — ED Triage Notes (Signed)
"  Bone pain" in pelvis x1 month   Seen at urgent care 2 weeks  Pt stated that urgent care did not check pelvis Pt stated she was diagnosed with pneumonia Given doxycyline and steroids  Complains of SOB and on going cough  Green sputum

## 2023-03-06 ENCOUNTER — Telehealth: Payer: Self-pay | Admitting: Internal Medicine

## 2023-03-06 ENCOUNTER — Other Ambulatory Visit: Payer: Self-pay | Admitting: Internal Medicine

## 2023-03-06 DIAGNOSIS — I10 Essential (primary) hypertension: Secondary | ICD-10-CM

## 2023-03-06 LAB — COMPREHENSIVE METABOLIC PANEL
ALT: 36 U/L (ref 0–44)
AST: 44 U/L — ABNORMAL HIGH (ref 15–41)
Albumin: 3.5 g/dL (ref 3.5–5.0)
Alkaline Phosphatase: 74 U/L (ref 38–126)
Anion gap: 8 (ref 5–15)
BUN: 10 mg/dL (ref 8–23)
CO2: 28 mmol/L (ref 22–32)
Calcium: 8.9 mg/dL (ref 8.9–10.3)
Chloride: 102 mmol/L (ref 98–111)
Creatinine, Ser: 0.5 mg/dL (ref 0.44–1.00)
GFR, Estimated: >60 mL/min (ref >60)
Glucose, Bld: 126 mg/dL — ABNORMAL HIGH (ref 70–99)
Potassium: 3.5 mmol/L (ref 3.5–5.1)
Sodium: 138 mmol/L (ref 135–145)
Total Bilirubin: 0.6 mg/dL (ref <1.2)
Total Protein: 7.5 g/dL (ref 6.5–8.1)

## 2023-03-06 LAB — RESP PANEL BY RT-PCR (RSV, FLU A&B, COVID)  RVPGX2
Influenza A by PCR: NEGATIVE
Influenza B by PCR: NEGATIVE
Resp Syncytial Virus by PCR: NEGATIVE
SARS Coronavirus 2 by RT PCR: NEGATIVE

## 2023-03-06 LAB — CBC
HCT: 46.2 % — ABNORMAL HIGH (ref 36.0–46.0)
Hemoglobin: 15.2 g/dL — ABNORMAL HIGH (ref 12.0–15.0)
MCH: 30.4 pg (ref 26.0–34.0)
MCHC: 32.9 g/dL (ref 30.0–36.0)
MCV: 92.4 fL (ref 80.0–100.0)
Platelets: 220 10*3/uL (ref 150–400)
RBC: 5 MIL/uL (ref 3.87–5.11)
RDW: 13.2 % (ref 11.5–15.5)
WBC: 9.7 10*3/uL (ref 4.0–10.5)
nRBC: 0 % (ref 0.0–0.2)

## 2023-03-06 LAB — TROPONIN I (HIGH SENSITIVITY): Troponin I (High Sensitivity): 13 ng/L (ref <18)

## 2023-03-06 LAB — LIPASE, BLOOD: Lipase: 36 U/L (ref 11–51)

## 2023-03-06 MED ORDER — AZITHROMYCIN 250 MG PO TABS: ORAL_TABLET | ORAL | 0 refills | Status: DC

## 2023-03-06 NOTE — Telephone Encounter (Signed)
Patient is needing to be seen sooner then the jan 22 her bp has been 179/120 she had to go to the hospital for it she as also been depressed and feeling like giving up she needs to be seen sooner she is not feeling well and she would like a call back she also stated that she needed a refill on her bp meds

## 2023-03-06 NOTE — Telephone Encounter (Signed)
Needs refills on telmisartan-hydrochlorothiazide (MICARDIS HCT) 80-25 MG tablet [[id number]] With pre auth   Patient Spoke with pharm and pharm states they have not received med refill request   Also pharm states that med has been denied by provider   (Meds was sent 12/6 and confirmed by pharm on 12/6 at 2:11pm)   Pt needs medication and wants a call back when meds have been sent and pre Berkley Harvey has been sent / decision has been made

## 2023-03-06 NOTE — Discharge Instructions (Addendum)
You were evaluated in the Emergency Department and after careful evaluation, we did not find any emergent condition requiring admission or further testing in the hospital.  Your exam/testing today is overall reassuring.  Recommend follow-up with your primary care doctor to discuss your pelvic pain and further testing.  Recommend use of the azithromycin at home for your cough, continued use of inhalers at home.  Please return to the Emergency Department if you experience any worsening of your condition.   Thank you for allowing [place] to be a part of your care.

## 2023-03-06 NOTE — Telephone Encounter (Signed)
Appt scheduled

## 2023-03-07 ENCOUNTER — Other Ambulatory Visit: Payer: Self-pay

## 2023-03-07 DIAGNOSIS — I10 Essential (primary) hypertension: Secondary | ICD-10-CM

## 2023-03-07 MED ORDER — TELMISARTAN-HCTZ 80-25 MG PO TABS: 1.0000 | ORAL_TABLET | Freq: Every day | ORAL | 3 refills | Status: DC

## 2023-03-07 NOTE — Telephone Encounter (Signed)
Left message to call office

## 2023-03-15 ENCOUNTER — Ambulatory Visit: Payer: No Typology Code available for payment source | Admitting: Internal Medicine

## 2023-03-18 ENCOUNTER — Encounter (HOSPITAL_COMMUNITY): Payer: Self-pay

## 2023-03-18 ENCOUNTER — Other Ambulatory Visit: Payer: Self-pay

## 2023-03-18 ENCOUNTER — Emergency Department (HOSPITAL_COMMUNITY)
Admission: EM | Admit: 2023-03-18 | Discharge: 2023-03-18 | Disposition: A | Discharge status: Home / Self Care | Payer: No Typology Code available for payment source | Attending: Emergency Medicine | Admitting: Emergency Medicine

## 2023-03-18 ENCOUNTER — Emergency Department (HOSPITAL_COMMUNITY): Payer: No Typology Code available for payment source

## 2023-03-18 DIAGNOSIS — Z79899 Other long term (current) drug therapy: Secondary | ICD-10-CM | POA: Diagnosis not present

## 2023-03-18 DIAGNOSIS — R0602 Shortness of breath: Secondary | ICD-10-CM | POA: Diagnosis not present

## 2023-03-18 DIAGNOSIS — I10 Essential (primary) hypertension: Secondary | ICD-10-CM | POA: Insufficient documentation

## 2023-03-18 DIAGNOSIS — J449 Chronic obstructive pulmonary disease, unspecified: Secondary | ICD-10-CM | POA: Insufficient documentation

## 2023-03-18 DIAGNOSIS — R0600 Dyspnea, unspecified: Secondary | ICD-10-CM | POA: Insufficient documentation

## 2023-03-18 LAB — CBC
HCT: 52.3 % — ABNORMAL HIGH (ref 36.0–46.0)
Hemoglobin: 17 g/dL — ABNORMAL HIGH (ref 12.0–15.0)
MCH: 30.2 pg (ref 26.0–34.0)
MCHC: 32.5 g/dL (ref 30.0–36.0)
MCV: 93.1 fL (ref 80.0–100.0)
Platelets: 259 10*3/uL (ref 150–400)
RBC: 5.62 MIL/uL — ABNORMAL HIGH (ref 3.87–5.11)
RDW: 13.2 % (ref 11.5–15.5)
WBC: 9.8 10*3/uL (ref 4.0–10.5)
nRBC: 0 % (ref 0.0–0.2)

## 2023-03-18 LAB — BASIC METABOLIC PANEL
Anion gap: 11 (ref 5–15)
BUN: 11 mg/dL (ref 8–23)
CO2: 27 mmol/L (ref 22–32)
Calcium: 9.6 mg/dL (ref 8.9–10.3)
Chloride: 98 mmol/L (ref 98–111)
Creatinine, Ser: 0.54 mg/dL (ref 0.44–1.00)
GFR, Estimated: >60 mL/min (ref >60)
Glucose, Bld: 118 mg/dL — ABNORMAL HIGH (ref 70–99)
Potassium: 3.4 mmol/L — ABNORMAL LOW (ref 3.5–5.1)
Sodium: 136 mmol/L (ref 135–145)

## 2023-03-18 LAB — TROPONIN I (HIGH SENSITIVITY): Troponin I (High Sensitivity): 14 ng/L (ref <18)

## 2023-03-18 MED ORDER — ALBUTEROL SULFATE HFA 108 (90 BASE) MCG/ACT IN AERS: 2.0000 | INHALATION_SPRAY | RESPIRATORY_TRACT | Status: DC | PRN

## 2023-03-18 NOTE — ED Notes (Addendum)
Pt left AMA. Pt walked out of room stating that she wanted to leave and that she's been waiting too long, then started walking toward the ED Lobby. Rod RN, attempted to talk with pt about staying to finish work-up and talking with the doctor, pt reiterated that she wanted to leave. Before leaving room pt detached monitoring supplies, no iv was placed during visit, pt AAOx4 and ambulatory.

## 2023-03-18 NOTE — ED Triage Notes (Signed)
SOB Complains of difficulty swallowing Complains of "flap in throat" keeping her from swallowing  States "it feels like something is in my throat" x2 days  Horse in triage Pt anxious 94% RA

## 2023-03-18 NOTE — ED Provider Notes (Signed)
[place] EMERGENCY DEPARTMENT AT [facility] Provider Note   CSN: [id number] Arrival date & time: 03/18/23  1936     History  Chief Complaint  Patient presents with   Shortness of Breath    Avalei [name] is a 62 y.o. female.   Shortness of Breath Patient presents feeling short of breath.  States she has been sick for the last 3 weeks.  States he now feels like there is a flap in her throat but it is obstructing her breathing.  That flap is the more recent.  States her voice is different.  States she had had chills.  States that she had an x-ray done under a week ago that did not show pneumonia.  She is a smoker.    Past Medical History:  Diagnosis Date   Anxiety    Arthritis    Cholecystitis 09/11/2017   Constipation    COPD (chronic obstructive pulmonary disease) (HCC)    Family history of adverse reaction to anesthesia    brother was "allergic" to anesthesia - heart stopped beating on the surgery table   Gallbladder sludge    GERD (gastroesophageal reflux disease)    Headache    used to have migraines   Hypertension    Palpitations    Proximal phalanx fracture of finger 03/31/2015   Staph infection     Home Medications Prior to Admission medications   Medication Sig Start Date End Date Taking? Authorizing Provider  albuterol (VENTOLIN HFA) 108 (90 Base) MCG/ACT inhaler Inhale 2 puffs into the lungs every 6 (six) hours as needed for wheezing or shortness of breath. 08/19/21   Anabel Halon, MD  albuterol (VENTOLIN HFA) 108 (90 Base) MCG/ACT inhaler Inhale 2 puffs into the lungs every 4 (four) hours as needed. 01/29/23   Particia Nearing, PA-C  azithromycin (ZITHROMAX) 250 MG tablet Take 2 tablets together on the first day, then 1 every day until finished. 03/06/23   Sabas Sous, MD  Budeson-Glycopyrrol-Formoterol (BREZTRI AEROSPHERE) 160-9-4.8 MCG/ACT AERO Inhale 2 puffs into the lungs 2 (two) times daily. 02/02/22   Anabel Halon, MD  doxycycline  (VIBRAMYCIN) 100 MG capsule Take 1 capsule (100 mg total) by mouth 2 (two) times daily. 01/29/23   Particia Nearing, PA-C  gabapentin (NEURONTIN) 300 MG capsule Take 1 capsule (300 mg total) by mouth at bedtime. 03/02/22   Anabel Halon, MD  ibuprofen (ADVIL) 800 MG tablet Take 1 tablet (800 mg total) by mouth every 8 (eight) hours as needed. 12/18/21   Particia Nearing, PA-C  omeprazole (PRILOSEC) 20 MG capsule Take 20 mg by mouth daily.    [provider]  ondansetron (ZOFRAN-ODT) 4 MG disintegrating tablet Take 1 tablet (4 mg total) by mouth every 8 (eight) hours as needed for nausea or vomiting. 02/04/22   Valentino Nose, NP  predniSONE (DELTASONE) 20 MG tablet Take 2 tablets (40 mg total) by mouth daily with breakfast. 01/29/23   Particia Nearing, PA-C  telmisartan-hydrochlorothiazide (MICARDIS HCT) 80-25 MG tablet Take 1 tablet by mouth daily. 03/07/23   Billie Lade, MD  varenicline (CHANTIX CONTINUING MONTH PAK) 1 MG tablet Take 1 tablet (1 mg total) by mouth 2 (two) times daily. 02/02/22   Anabel Halon, MD      Allergies    Penicillins    Review of Systems   Review of Systems  Respiratory:  Positive for shortness of breath.     Physical Exam Updated Vital  Signs BP (!) 206/104 (BP Location: Right Arm)   Pulse 73   Temp 97.9 F (36.6 C)   Resp (!) 22   Ht 5' (1.524 m)   Wt 63.5 kg   SpO2 91%   BMI 27.34 kg/m  Physical Exam Vitals and nursing note reviewed.  HENT:     Head: Atraumatic.     Mouth/Throat:     Comments: No severe swelling.  Does have some coloration in the back of her throat, likely from smoking. Cardiovascular:     Rate and Rhythm: Regular rhythm.  Pulmonary:     Effort: No respiratory distress.     Breath sounds: No wheezing or rhonchi.  Chest:     Chest wall: No tenderness.  Neurological:     Mental Status: She is alert.  Psychiatric:        Mood and Affect: Mood is anxious.     ED Results / Procedures /  Treatments   Labs (all labs ordered are listed, but only abnormal results are displayed) Labs Reviewed  BASIC METABOLIC PANEL - Abnormal; Notable for the following components:      Result Value   Potassium 3.4 (*)    Glucose, Bld 118 (*)    All other components within normal limits  CBC - Abnormal; Notable for the following components:   RBC 5.62 (*)    Hemoglobin 17.0 (*)    HCT 52.3 (*)    All other components within normal limits  TROPONIN I (HIGH SENSITIVITY)    EKG None  Radiology DG Neck Soft Tissue Result Date: 03/18/2023 CLINICAL DATA:  Swelling. Difficulty swallowing. Feels like something stuck in throat for 2 days. EXAM: NECK SOFT TISSUES - 1+ VIEW COMPARISON:  CT neck 01/01/2019 FINDINGS: Calcification in the thyroid cartilage and vascular calcifications in the neck. No radiopaque foreign body is identified. No prevertebral or submental soft tissue swelling. The epiglottis and aryepiglottic folds are normal. Cervical airway appears patent. Degenerative changes in the cervical spine. IMPRESSION: No radiopaque foreign bodies identified. Electronically Signed   By: Burman Nieves M.D.   On: 03/18/2023 20:20   DG Chest 2 View Result Date: 03/18/2023 CLINICAL DATA:  Shortness of breath. Difficulty swallowing. Feels like something stuck in throat for 2 days. EXAM: CHEST - 2 VIEW COMPARISON:  03/05/2023 FINDINGS: Heart size and pulmonary vascularity are normal. Lungs are clear. No pleural effusions. No pneumothorax. Mediastinal contours appear intact. Calcification of the aorta. Degenerative changes in the spine and shoulders. No change since previous study. IMPRESSION: No active cardiopulmonary disease. Electronically Signed   By: Burman Nieves M.D.   On: 03/18/2023 20:17    Procedures Procedures    Medications Ordered in ED Medications - No data to display  ED Course/ Medical Decision Making/ A&P                                 Medical Decision Making Amount  and/or Complexity of Data Reviewed Labs: ordered. Radiology: ordered.   Patient shortness of breath.  States he feels that there is something in her throat.  States she has had a lot of mucus.  States it feels that there is a flap in the throat getting in the way.  Does have somewhat harsh voice.  Will get chest x-ray and soft tissue neck.  Doubt severe acute obstruction.  History of COPD.  Reviewed previous imaging of her chest and of her neck.  Imaging of chest and neck soft tissue done and reassuring.  However patient not willing to wait for results.  Was walking out and told nurse she was not willing to stay.  I did not get a chance to talk to her.          Final Clinical Impression(s) / ED Diagnoses Final diagnoses:  Dyspnea, unspecified type    Rx / DC Orders ED Discharge Orders     None         Benjiman Core, MD 03/18/23 2338

## 2023-03-30 ENCOUNTER — Ambulatory Visit: Payer: Self-pay | Admitting: Internal Medicine

## 2023-03-30 NOTE — Telephone Encounter (Signed)
 Copied from CRM [id number]. Topic: Clinical - Pink Word Triage >> Mar 30, 2023  1:38 PM Kinnie H wrote: Reason for CRM: having difficulty breathing and swallowing   Chief Complaint: Hoarse/Raspy voice causing patient to get short of breath and following dry cough. Symptoms: Hoarse/Raspy Voice Frequency: x 1.5 months with the voice and  Pertinent Negatives: Patient denies any known allergies Disposition: [x] ED /[] Urgent Care (no appt availability in office) / [] Appointment(In office/virtual)/ []  [facility]/ [] Home Care/ [] Refused Recommended Disposition /[] [facility]/ []  Follow-up with PCP Additional Notes: Patient states that she has had issues for 3 months now.  She feels like something is stuck in her throat.  Patient started with a cough and then went urgent care and given antibiotics and told it might be an upper respiratory infection.  Patient then went to an Emergency Room because she was having difficulty breathing at that time.  She was given a Zpak at that time.  She states that this is getting worse.  Patient then went to the ER another time.  Patient states she had a bad experience at that time.  Pt states she saw scan results and nothing was discussed with her.  Patient became very upset on the phone because she feels like she has been dismissed by medical personnel prior to this.  Patient is very hoarse and her voice is extremely raspy.  Patient states that she is having difficulty swallowing and if she drinks cold water to be able to have some clearness in her throat.  Patient states that about 3 months ago she was around car paint fumes and ended up getting a paint mask.  She also endorses red blotches all over her skin and having high fevers.  She states it was 102.6 yesterday.  Patient is very upset on the phone and states that she has had three rounds of antibiotics and patient didn't want to keep taking medication to mask her fevers.  A female best friend is  present with her at this time and is going to drive her to the hospital.  Patient states that she will go to the next nearest hospital due to not having good experiences at her last three visits.  She is advised that that it up to her if it means she will go get checked out now.  Patient states she cannot eat and barely can drink due to feeling like everything gets stuck in her throat and won't go down.  She stated that she saw she can calcifications on a scan and was very concerned for this as well.  She advised that she was extremely scared and the time between being able to catch her breath in between episodes of swallowing difficulty were getting few and far between.  Patient did have a coughing fit while on the phone with this RN and it was a very raspy cough and almost sounded high pitched as well.  Patient states she does not want anything for pain she just wanted to be able to swallow and breathe without it being so difficult.  Patient states that she will go get seen at this time at an Emergency Room with her best friend with her.  She is also advised that if things get worse they can call 911 if needed.  Reason for Disposition . Patient sounds very sick or weak to the triager  Answer Assessment - Initial Assessment Questions 1. DESCRIPTION: Describe your voice. (e.g., coarse, raspy, weaker, airy, scratchy, deeper)  Very raspy voice 2. SEVERITY: How bad is it?   - MILD: doesn't interfere with normal activities   - MODERATE: interferes with normal activities such as school or work   - SEVERE: only able to whisper.     Severe 3. ONSET: When did the hoarseness begin?     X 1.5 months   started with a cough 1.5 month prior 4. COUGH: Is there a cough? If Yes, ask: How bad is it?     X 3 cough 5. FEVER: Do you have a fever? If Yes, ask: What is your temperature, how was it measured, and when did it start?     Patient states she has been running fevers and yesterday she took her  temperature and it was 102.6 at that time and she states she has been taking Advil  for that 6. ALLERGIES: Any allergy symptoms? If Yes, ask: What are they?     Patient denies 7. IRRITANTS: Do you smoke? Have you been exposed to any irritating fumes? (e.g., smoke)     Car paint fumes ago pretty much the same time this started 8. CAUSE: What do you think is causing the hoarseness?     Unknown exactly--patient was around car paint fumes and bought a paint mask at that time 9. OTHER SYMPTOMS: Do you have any other symptoms? (e.g., breathing difficulty, fever, foreign body, lymph node swelling in neck, rash, sore throat, weight loss)     Fever, Crazy skin conditions like red blotches and bruising and knots in different parts of my body I've lost 12 pounds in the past week and a half  Protocols used: [facility]

## 2023-04-03 ENCOUNTER — Encounter: Payer: Self-pay | Admitting: Internal Medicine

## 2023-04-03 ENCOUNTER — Ambulatory Visit (INDEPENDENT_AMBULATORY_CARE_PROVIDER_SITE_OTHER): Payer: No Typology Code available for payment source | Admitting: Internal Medicine

## 2023-04-03 VITALS — BP 166/92 | HR 96 | Ht 59.5 in | Wt 143.4 lb

## 2023-04-03 DIAGNOSIS — J449 Chronic obstructive pulmonary disease, unspecified: Secondary | ICD-10-CM

## 2023-04-03 DIAGNOSIS — K2950 Unspecified chronic gastritis without bleeding: Secondary | ICD-10-CM | POA: Diagnosis not present

## 2023-04-03 DIAGNOSIS — J392 Other diseases of pharynx: Secondary | ICD-10-CM | POA: Insufficient documentation

## 2023-04-03 DIAGNOSIS — K219 Gastro-esophageal reflux disease without esophagitis: Secondary | ICD-10-CM | POA: Insufficient documentation

## 2023-04-03 DIAGNOSIS — J029 Acute pharyngitis, unspecified: Secondary | ICD-10-CM

## 2023-04-03 DIAGNOSIS — I10 Essential (primary) hypertension: Secondary | ICD-10-CM

## 2023-04-03 DIAGNOSIS — J439 Emphysema, unspecified: Secondary | ICD-10-CM

## 2023-04-03 DIAGNOSIS — J441 Chronic obstructive pulmonary disease with (acute) exacerbation: Secondary | ICD-10-CM | POA: Diagnosis not present

## 2023-04-03 MED ORDER — GUAIFENESIN-CODEINE 100-10 MG/5ML PO SOLN: 5.0000 mL | Freq: Three times a day (TID) | ORAL | 0 refills | Status: DC | PRN

## 2023-04-03 MED ORDER — AMLODIPINE BESYLATE 5 MG PO TABS: 5.0000 mg | ORAL_TABLET | Freq: Every day | ORAL | 1 refills | Status: DC

## 2023-04-03 MED ORDER — OMEPRAZOLE 40 MG PO CPDR: 40.0000 mg | DELAYED_RELEASE_CAPSULE | Freq: Every day | ORAL | 3 refills | Status: AC

## 2023-04-03 MED ORDER — BREZTRI AEROSPHERE 160-9-4.8 MCG/ACT IN AERO: 2.0000 | INHALATION_SPRAY | Freq: Two times a day (BID) | RESPIRATORY_TRACT | 11 refills | Status: AC

## 2023-04-03 MED ORDER — LIDOCAINE VISCOUS HCL 2 % MT SOLN: 5.0000 mL | Freq: Three times a day (TID) | OROMUCOSAL | 0 refills | Status: DC | PRN

## 2023-04-03 MED ORDER — ALBUTEROL SULFATE HFA 108 (90 BASE) MCG/ACT IN AERS: 2.0000 | INHALATION_SPRAY | Freq: Four times a day (QID) | RESPIRATORY_TRACT | 5 refills | Status: DC | PRN

## 2023-04-03 MED ORDER — AZITHROMYCIN 250 MG PO TABS: ORAL_TABLET | ORAL | 0 refills | Status: DC

## 2023-04-03 MED ORDER — PREDNISONE 10 MG (21) PO TBPK: ORAL_TABLET | ORAL | 0 refills | Status: DC

## 2023-04-03 NOTE — Progress Notes (Signed)
 Acute Office Visit  Subjective:    Patient ID: [name], female    DOB: Jun 30, 1960, 63 y.o.   MRN: [id number]  Chief Complaint  Patient presents with   Cough    Cough with mucus, fever , sore throat    Hypertension    HPI Patient is in today for complaint of persistent cough with thick mucoid expectoration, sore throat and low grade fever on recurrent basis for the last 2 months.  She has been to ER 2 times, and has been prescribed oral steroids and doxycycline , azithromycin  as well.  She has recurrent symptoms, and is unable to bring out sputum easily now.  She has dyspnea and wheezing as well, has been using Breztri  only once daily instead of twice daily and does not use Albuterol .  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  Her BP was elevated today.  She takes telmisartan -hydrochlorothiazide  80-25 mg once daily.  Her BP remains around 150s/80s at home as well.  Denies chest pain or palpitations currently.  Past Medical History:  Diagnosis Date   Anxiety    Arthritis    Cholecystitis 09/11/2017   Constipation    COPD (chronic obstructive pulmonary disease) (HCC)    Family history of adverse reaction to anesthesia    brother was allergic to anesthesia - heart stopped beating on the surgery table   Gallbladder sludge    GERD (gastroesophageal reflux disease)    Headache    used to have migraines   Hypertension    Palpitations    Proximal phalanx fracture of finger 03/31/2015   Staph infection     Past Surgical History:  Procedure Laterality Date   CHOLECYSTECTOMY N/A 09/13/2017   Procedure: LAPAROSCOPIC CHOLECYSTECTOMY;  Surgeon: Kallie Manuelita BROCKS, MD;  Location: [facility];  Service: General;  Laterality: N/A;   GROIN DEBRIDEMENT     due to spider bite,    OPEN REDUCTION INTERNAL FIXATION (ORIF) PROXIMAL PHALANX Right 04/07/2015   Procedure: OPEN REDUCTION INTERNAL FIXATION (ORIF)  PROXIMAL PHALANX;  Surgeon: Glendia Cordella Hutchinson, MD;  Location: [facility] OR;  Service: Orthopedics;  Laterality: Right;   TONSILLECTOMY     TUBAL LIGATION      Family History  Problem Relation Age of Onset   COPD Mother    Diabetes Father     Social History   Socioeconomic History   Marital status: Married    Spouse name: Not on file   Number of children: Not on file   Years of education: Not on file   Highest education level: Not on file  Occupational History   Not on file  Tobacco Use   Smoking status: Some Days    Current packs/day: 0.50    Types: Cigarettes   Smokeless tobacco: Never  Vaping Use   Vaping status: Never Used  Substance and Sexual Activity   Alcohol use: Yes    Comment: wine occ   Drug use: No   Sexual activity: Not on file  Other Topics Concern   Not on file  Social History Narrative   Not on file   Social Drivers of Health   Financial Resource Strain: Not on file  Food Insecurity: Not on file  Transportation Needs: Not on file  Physical Activity: Not on file  Stress: Not on file  Social Connections: Not on file  Intimate Partner Violence: Not on file    Outpatient Medications Prior  to Visit  Medication Sig Dispense Refill   gabapentin  (NEURONTIN ) 300 MG capsule Take 1 capsule (300 mg total) by mouth at bedtime. 30 capsule 3   ibuprofen  (ADVIL ) 800 MG tablet Take 1 tablet (800 mg total) by mouth every 8 (eight) hours as needed. 30 tablet 0   ondansetron  (ZOFRAN -ODT) 4 MG disintegrating tablet Take 1 tablet (4 mg total) by mouth every 8 (eight) hours as needed for nausea or vomiting. 20 tablet 0   telmisartan -hydrochlorothiazide  (MICARDIS  HCT) 80-25 MG tablet Take 1 tablet by mouth daily. 30 tablet 3   albuterol  (VENTOLIN  HFA) 108 (90 Base) MCG/ACT inhaler Inhale 2 puffs into the lungs every 6 (six) hours as needed for wheezing or shortness of breath. 18 g 5   albuterol  (VENTOLIN  HFA) 108 (90 Base) MCG/ACT inhaler Inhale 2 puffs into the lungs every  4 (four) hours as needed. 18 g 0   azithromycin  (ZITHROMAX ) 250 MG tablet Take 2 tablets together on the first day, then 1 every day until finished. 6 tablet 0   Budeson-Glycopyrrol-Formoterol (BREZTRI  AEROSPHERE) 160-9-4.8 MCG/ACT AERO Inhale 2 puffs into the lungs 2 (two) times daily. 10.7 g 11   doxycycline  (VIBRAMYCIN ) 100 MG capsule Take 1 capsule (100 mg total) by mouth 2 (two) times daily. 20 capsule 0   omeprazole  (PRILOSEC) 20 MG capsule Take 20 mg by mouth daily.     predniSONE  (DELTASONE ) 20 MG tablet Take 2 tablets (40 mg total) by mouth daily with breakfast. 10 tablet 0   varenicline  (CHANTIX  CONTINUING MONTH PAK) 1 MG tablet Take 1 tablet (1 mg total) by mouth 2 (two) times daily. 60 tablet 2   No facility-administered medications prior to visit.    Allergies  Allergen Reactions   Penicillins Swelling    Throat swelling Has patient had a PCN reaction causing immediate rash, facial/tongue/throat swelling, SOB or lightheadedness with hypotension: Yes Has patient had a PCN reaction causing severe rash involving mucus membranes or skin necrosis: No Has patient had a PCN reaction that required hospitalization  ED visit but no hospitalization Has patient had a PCN reaction occurring within the last 10 years: No If all of the above answers are NO, then may proceed with Cephalosporin use.    Review of Systems  Constitutional:  Negative for chills and fever.  HENT:  Positive for congestion, sinus pressure, sore throat and trouble swallowing.   Eyes:  Negative for pain and discharge.  Respiratory:  Positive for cough and shortness of breath.   Cardiovascular:  Negative for chest pain and palpitations.  Gastrointestinal:  Positive for constipation. Negative for diarrhea, nausea and vomiting.  Endocrine: Negative for polydipsia and polyuria.  Genitourinary:  Negative for dysuria and hematuria.  Musculoskeletal:  Positive for arthralgias, back pain and neck pain. Negative for neck  stiffness.  Skin:  Negative for rash.  Neurological:  Positive for numbness. Negative for dizziness and weakness.  Psychiatric/Behavioral:  Negative for agitation and behavioral problems.        Objective:    Physical Exam Vitals reviewed.  Constitutional:      General: She is not in acute distress.    Appearance: She is not diaphoretic.  HENT:     Head: Normocephalic and atraumatic.     Nose: Congestion present.     Mouth/Throat:     Mouth: Mucous membranes are moist.  Eyes:     General: No scleral icterus.    Extraocular Movements: Extraocular movements intact.  Cardiovascular:     Rate and  Rhythm: Normal rate and regular rhythm.     Heart sounds: Normal heart sounds. No murmur heard. Pulmonary:     Breath sounds: Wheezing (Mild, diffuse) present. No rales.  Musculoskeletal:     Right hand: Tenderness present.     Left hand: Tenderness present.     Cervical back: Neck supple. No tenderness.     Right lower leg: No edema.     Left lower leg: No edema.     Comments: Heberden and Bouchard nodes bilaterally Trigger finger of left ring finger  Skin:    General: Skin is warm.     Findings: No rash.  Neurological:     General: No focal deficit present.     Mental Status: She is alert and oriented to person, place, and time.     Sensory: Sensory deficit (B/l hands) present.     Motor: No weakness.  Psychiatric:        Mood and Affect: Mood normal.        Behavior: Behavior normal.     BP (!) 154/86 (BP Location: Left Arm)   Pulse 96   Ht 4' 11.5 (1.511 m)   Wt 143 lb 6.4 oz (65 kg)   SpO2 90%   BMI 28.48 kg/m  Wt Readings from Last 3 Encounters:  04/03/23 143 lb 6.4 oz (65 kg)  03/18/23 140 lb (63.5 kg)  03/05/23 132 lb (59.9 kg)        Assessment & Plan:   Problem List Items Addressed This Visit       Cardiovascular and Mediastinum   Essential hypertension   BP Readings from Last 1 Encounters:  04/03/23 (!) 154/86   Uncontrolled with  telmisartan -HCTZ 80-25 mg QD Added Amlodipine  5 mg QD Counseled for compliance with the medications Advised DASH diet and moderate exercise/walking, at least 150 mins/week      Relevant Medications   amLODipine  (NORVASC ) 5 MG tablet     Respiratory   Chronic obstructive pulmonary disease (HCC) - Primary   Sterapred taper for COPD exacerbation Started empiric azithromycin  Chronic, intermittent dyspnea, worse with exertion and cold, dry air Needs to quit smoking Albuterol  as needed for now Has Breztri  as maintenance inhaler, but needs to use it BID - did not  tolerate Trelegy Cheratussin as needed for cough      Relevant Medications   azithromycin  (ZITHROMAX ) 250 MG tablet   guaiFENesin -codeine  100-10 MG/5ML syrup   Budeson-Glycopyrrol-Formoterol (BREZTRI  AEROSPHERE) 160-9-4.8 MCG/ACT AERO   albuterol  (VENTOLIN  HFA) 108 (90 Base) MCG/ACT inhaler   magic mouthwash (lidocaine , diphenhydrAMINE , alum & mag hydroxide) suspension   predniSONE  (STERAPRED UNI-PAK 21 TAB) 10 MG (21) TBPK tablet   Thornwaldt's cyst   Usually benign, but considering her recurrent sinus symptoms and sore throat - will recheck CT neck Referred to ENT specialist      Relevant Orders   CT SOFT TISSUE NECK WO CONTRAST   Ambulatory referral to ENT     Digestive   Chronic gastritis without bleeding   Epigastric discomfort and sore throat could be due to GERD/gastritis Increased dose of omeprazole  to 40 mg QD      Relevant Medications   omeprazole  (PRILOSEC) 40 MG capsule   Other Visit Diagnoses       Sore throat       Relevant Medications   magic mouthwash (lidocaine , diphenhydrAMINE , alum & mag hydroxide) suspension        Meds ordered this encounter  Medications   azithromycin  (ZITHROMAX ) 250 MG  tablet    Sig: Take 2 tablets together on the first day, then 1 every day until finished.    Dispense:  6 tablet    Refill:  0   guaiFENesin -codeine  100-10 MG/5ML syrup    Sig: Take 5 mLs by mouth  3 (three) times daily as needed for cough.    Dispense:  120 mL    Refill:  0   omeprazole  (PRILOSEC) 40 MG capsule    Sig: Take 1 capsule (40 mg total) by mouth daily.    Dispense:  30 capsule    Refill:  3   Budeson-Glycopyrrol-Formoterol (BREZTRI  AEROSPHERE) 160-9-4.8 MCG/ACT AERO    Sig: Inhale 2 puffs into the lungs 2 (two) times daily.    Dispense:  10.7 g    Refill:  11   albuterol  (VENTOLIN  HFA) 108 (90 Base) MCG/ACT inhaler    Sig: Inhale 2 puffs into the lungs every 6 (six) hours as needed for wheezing or shortness of breath.    Dispense:  18 g    Refill:  5   amLODipine  (NORVASC ) 5 MG tablet    Sig: Take 1 tablet (5 mg total) by mouth daily.    Dispense:  30 tablet    Refill:  1   magic mouthwash (lidocaine , diphenhydrAMINE , alum & mag hydroxide) suspension    Sig: Swish and swallow 5 mLs 3 (three) times daily as needed for mouth pain.    Dispense:  360 mL    Refill:  0   predniSONE  (STERAPRED UNI-PAK 21 TAB) 10 MG (21) TBPK tablet    Sig: Take as package instructions.    Dispense:  1 each    Refill:  0     Tranell Wojtkiewicz MARLA Blanch, MD

## 2023-04-03 NOTE — Patient Instructions (Signed)
 Please start taking Azithromycin  and Prednisone   as prescribed.  Please take cough syrup as needed for cough.  Please use magic mouthwash only as needed for persistent throat pain.  Start taking Omeprazole  40 mg once daily for gastritis/acid reflux.  Start taking Amlodipine  in addition to Telmisartan -hydrochlorothiazide  as prescribed for blood pressure.

## 2023-04-04 NOTE — Assessment & Plan Note (Signed)
 Usually benign, but considering her recurrent sinus symptoms and sore throat - will recheck CT neck Referred to ENT specialist

## 2023-04-04 NOTE — Assessment & Plan Note (Addendum)
 BP Readings from Last 1 Encounters:  04/03/23 (!) 154/86   Uncontrolled with telmisartan-HCTZ 80-25 mg QD Added Amlodipine 5 mg QD Counseled for compliance with the medications Advised DASH diet and moderate exercise/walking, at least 150 mins/week

## 2023-04-04 NOTE — Assessment & Plan Note (Signed)
 Epigastric discomfort and sore throat could be due to GERD/gastritis Increased dose of omeprazole to 40 mg QD

## 2023-04-04 NOTE — Assessment & Plan Note (Addendum)
 Sterapred taper for COPD exacerbation Started empiric azithromycin  Chronic, intermittent dyspnea, worse with exertion and cold, dry air Needs to quit smoking Albuterol  as needed for now Has Breztri  as maintenance inhaler, but needs to use it BID - did not  tolerate Trelegy Cheratussin as needed for cough

## 2023-04-05 ENCOUNTER — Encounter (INDEPENDENT_AMBULATORY_CARE_PROVIDER_SITE_OTHER): Payer: Self-pay | Admitting: Otolaryngology

## 2023-04-06 NOTE — Telephone Encounter (Signed)
 Copied from CRM [id number]. Topic: Clinical - Medication Question >> Apr 06, 2023 10:59 AM Zane Herald wrote: Reason for CRM: patient wants Dr Allena Katz to call in prescription for cough meds. Pat stt she is not getting better yet

## 2023-04-10 ENCOUNTER — Other Ambulatory Visit: Payer: Self-pay | Admitting: Internal Medicine

## 2023-04-10 DIAGNOSIS — J441 Chronic obstructive pulmonary disease with (acute) exacerbation: Secondary | ICD-10-CM

## 2023-04-10 MED ORDER — GUAIFENESIN-CODEINE 100-10 MG/5ML PO SOLN: 5.0000 mL | Freq: Three times a day (TID) | ORAL | 0 refills | Status: DC | PRN

## 2023-04-10 NOTE — Telephone Encounter (Signed)
 Pt informed

## 2023-04-12 ENCOUNTER — Ambulatory Visit: Payer: No Typology Code available for payment source | Admitting: Internal Medicine

## 2023-04-19 ENCOUNTER — Encounter: Payer: Self-pay | Admitting: Internal Medicine

## 2023-04-19 ENCOUNTER — Ambulatory Visit (INDEPENDENT_AMBULATORY_CARE_PROVIDER_SITE_OTHER): Payer: No Typology Code available for payment source | Admitting: Internal Medicine

## 2023-04-19 VITALS — BP 138/76 | HR 101 | Ht 59.5 in | Wt 145.8 lb

## 2023-04-19 DIAGNOSIS — I10 Essential (primary) hypertension: Secondary | ICD-10-CM | POA: Diagnosis not present

## 2023-04-19 DIAGNOSIS — M7989 Other specified soft tissue disorders: Secondary | ICD-10-CM

## 2023-04-19 DIAGNOSIS — J329 Chronic sinusitis, unspecified: Secondary | ICD-10-CM | POA: Diagnosis not present

## 2023-04-19 DIAGNOSIS — R1310 Dysphagia, unspecified: Secondary | ICD-10-CM | POA: Diagnosis not present

## 2023-04-19 DIAGNOSIS — J029 Acute pharyngitis, unspecified: Secondary | ICD-10-CM

## 2023-04-19 DIAGNOSIS — Z23 Encounter for immunization: Secondary | ICD-10-CM

## 2023-04-19 DIAGNOSIS — J441 Chronic obstructive pulmonary disease with (acute) exacerbation: Secondary | ICD-10-CM

## 2023-04-19 MED ORDER — LIDOCAINE VISCOUS HCL 2 % MT SOLN: 5.0000 mL | Freq: Three times a day (TID) | OROMUCOSAL | 0 refills | Status: AC | PRN

## 2023-04-19 MED ORDER — FLUTICASONE PROPIONATE 50 MCG/ACT NA SUSP: 2.0000 | Freq: Every day | NASAL | 1 refills | Status: AC

## 2023-04-19 MED ORDER — GUAIFENESIN-CODEINE 100-10 MG/5ML PO SOLN: 5.0000 mL | Freq: Three times a day (TID) | ORAL | 0 refills | Status: AC | PRN

## 2023-04-19 NOTE — Assessment & Plan Note (Addendum)
Likely due to amlodipine Chronic venous insufficiency can also lead to leg swelling Advised to perform leg elevation Use compression socks as needed Already on hydrochlorothiazide for HTN

## 2023-04-19 NOTE — Assessment & Plan Note (Signed)
Chronic nasal congestion likely contributing to postnasal drip, eustachian tube dysfunction and chronic sore throat Has been referred to ENT specialist Advised to use Flonase instead of decongestant nasal spray

## 2023-04-19 NOTE — Assessment & Plan Note (Signed)
BP Readings from Last 1 Encounters:  04/19/23 138/76   Well-controlled with telmisartan-HCTZ 80-25 mg QD and amlodipine 5 mg once daily now Counseled for compliance with the medications Advised DASH diet and moderate exercise/walking, at least 150 mins/week

## 2023-04-19 NOTE — Progress Notes (Signed)
Acute Office Visit  Subjective:    Patient ID: [name], female    DOB: 1960/11/28, 63 y.o.   MRN: [id number]  Chief Complaint  Patient presents with   Dysphagia    Difficulty swallowing    Leg Swelling    Leg swelling mainly at night    Ear Pain    Pain in both ears    HPI Patient is in today for complaint of persistent sore throat and odynophagia.  She also reports bilateral ear pain, but denies any ear discharge.  Denies any fever, chills.  She has had difficulty swallowing food, and has been unable to eat regular food for the last 2 months.  She has difficulty taking her medicines as well.  She was referred to ENT specialist in the last visit, but has not been able to set up appointment yet.  She has noticed improvement in persistent cough with thick mucoid expectoration since starting Breztri.  She has been taking guaifenesin-codeine syrup as needed for cough as well.  She has dyspnea and wheezing as well, but improves with albuterol inhaler.  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  Her BP was better controlled today.  She takes telmisartan-hydrochlorothiazide 80-25 mg once daily and amlodipine 5 mg once daily now.  Her BP remains around 130s/80s at home as well.  Denies chest pain or palpitations currently.  She has noticed mild leg swelling, which could be due to amlodipine.  Her leg swelling usually improves in the morning.  Past Medical History:  Diagnosis Date   Anxiety    Arthritis    Cholecystitis 09/11/2017   Constipation    COPD (chronic obstructive pulmonary disease) (HCC)    Family history of adverse reaction to anesthesia    brother was "allergic" to anesthesia - heart stopped beating on the surgery table   Gallbladder sludge    GERD (gastroesophageal reflux disease)    Headache    used to have migraines   Hypertension    Palpitations    Proximal phalanx fracture  of finger 03/31/2015   Staph infection     Past Surgical History:  Procedure Laterality Date   CHOLECYSTECTOMY N/A 09/13/2017   Procedure: LAPAROSCOPIC CHOLECYSTECTOMY;  Surgeon: Lucretia Roers, MD;  Location: [facility];  Service: General;  Laterality: N/A;   GROIN DEBRIDEMENT     due to spider bite,    OPEN REDUCTION INTERNAL FIXATION (ORIF) PROXIMAL PHALANX Right 04/07/2015   Procedure: OPEN REDUCTION INTERNAL FIXATION (ORIF) PROXIMAL PHALANX;  Surgeon: Cammy Copa, MD;  Location: [facility];  Service: Orthopedics;  Laterality: Right;   TONSILLECTOMY     TUBAL LIGATION      Family History  Problem Relation Age of Onset   COPD Mother    Diabetes Father     Social History   Socioeconomic History   Marital status: Married    Spouse name: Not on file   Number of children: Not on file   Years of education: Not on file   Highest education level: Not on file  Occupational History   Not on file  Tobacco Use   Smoking status: Some Days    Current packs/day: 0.50    Types: Cigarettes   Smokeless tobacco: Never  Vaping Use   Vaping status: Never Used  Substance and Sexual Activity   Alcohol use: Yes    Comment: wine occ   Drug use:  No   Sexual activity: Not on file  Other Topics Concern   Not on file  Social History Narrative   Not on file   Social Drivers of Health   Financial Resource Strain: Not on file  Food Insecurity: Not on file  Transportation Needs: Not on file  Physical Activity: Not on file  Stress: Not on file  Social Connections: Not on file  Intimate Partner Violence: Not on file    Outpatient Medications Prior to Visit  Medication Sig Dispense Refill   albuterol (VENTOLIN HFA) 108 (90 Base) MCG/ACT inhaler Inhale 2 puffs into the lungs every 6 (six) hours as needed for wheezing or shortness of breath. 18 g 5   amLODipine (NORVASC) 5 MG tablet Take 1 tablet (5 mg total) by mouth daily. 30 tablet 1   Budeson-Glycopyrrol-Formoterol (BREZTRI  AEROSPHERE) 160-9-4.8 MCG/ACT AERO Inhale 2 puffs into the lungs 2 (two) times daily. 10.7 g 11   gabapentin (NEURONTIN) 300 MG capsule Take 1 capsule (300 mg total) by mouth at bedtime. 30 capsule 3   ibuprofen (ADVIL) 800 MG tablet Take 1 tablet (800 mg total) by mouth every 8 (eight) hours as needed. 30 tablet 0   omeprazole (PRILOSEC) 40 MG capsule Take 1 capsule (40 mg total) by mouth daily. 30 capsule 3   ondansetron (ZOFRAN-ODT) 4 MG disintegrating tablet Take 1 tablet (4 mg total) by mouth every 8 (eight) hours as needed for nausea or vomiting. 20 tablet 0   telmisartan-hydrochlorothiazide (MICARDIS HCT) 80-25 MG tablet Take 1 tablet by mouth daily. 30 tablet 3   azithromycin (ZITHROMAX) 250 MG tablet Take 2 tablets together on the first day, then 1 every day until finished. 6 tablet 0   guaiFENesin-codeine 100-10 MG/5ML syrup Take 5 mLs by mouth 3 (three) times daily as needed for cough. 120 mL 0   magic mouthwash (lidocaine, diphenhydrAMINE, alum & mag hydroxide) suspension Swish and swallow 5 mLs 3 (three) times daily as needed for mouth pain. 360 mL 0   predniSONE (STERAPRED UNI-PAK 21 TAB) 10 MG (21) TBPK tablet Take as package instructions. 1 each 0   No facility-administered medications prior to visit.    Allergies  Allergen Reactions   Penicillins Swelling    Throat swelling Has patient had a PCN reaction causing immediate rash, facial/tongue/throat swelling, SOB or lightheadedness with hypotension: Yes Has patient had a PCN reaction causing severe rash involving mucus membranes or skin necrosis: No Has patient had a PCN reaction that required hospitalization  ED visit but no hospitalization Has patient had a PCN reaction occurring within the last 10 years: No If all of the above answers are "NO", then may proceed with Cephalosporin use.    Review of Systems  Constitutional:  Negative for chills and fever.  HENT:  Positive for congestion, sinus pressure, sore throat,  trouble swallowing and voice change.   Eyes:  Negative for pain and discharge.  Respiratory:  Positive for cough and shortness of breath.   Cardiovascular:  Positive for leg swelling. Negative for chest pain and palpitations.  Gastrointestinal:  Positive for constipation. Negative for diarrhea, nausea and vomiting.  Endocrine: Negative for polydipsia and polyuria.  Genitourinary:  Negative for dysuria and hematuria.  Musculoskeletal:  Positive for arthralgias, back pain and neck pain. Negative for neck stiffness.  Skin:  Negative for rash.  Neurological:  Positive for numbness. Negative for dizziness and weakness.  Psychiatric/Behavioral:  Negative for agitation and behavioral problems.  Objective:    Physical Exam Vitals reviewed.  Constitutional:      General: She is not in acute distress.    Appearance: She is not diaphoretic.  HENT:     Head: Normocephalic and atraumatic.     Nose: Congestion present.     Mouth/Throat:     Mouth: Mucous membranes are moist.     Pharynx: Posterior oropharyngeal erythema present.  Eyes:     General: No scleral icterus.    Extraocular Movements: Extraocular movements intact.  Cardiovascular:     Rate and Rhythm: Normal rate and regular rhythm.     Heart sounds: Normal heart sounds. No murmur heard. Pulmonary:     Breath sounds: Wheezing (Mild, diffuse) present. No rales.  Musculoskeletal:     Right hand: Tenderness present.     Left hand: Tenderness present.     Cervical back: Neck supple. No tenderness.     Right lower leg: Edema (Mild) present.     Left lower leg: Edema (Mild) present.     Comments: Heberden and Bouchard nodes bilaterally Trigger finger of left ring finger  Skin:    General: Skin is warm.     Findings: No rash.  Neurological:     General: No focal deficit present.     Mental Status: She is alert and oriented to person, place, and time.     Sensory: Sensory deficit (B/l hands) present.     Motor: No weakness.   Psychiatric:        Mood and Affect: Mood normal.        Behavior: Behavior normal.     BP 138/76 (BP Location: Left Arm)   Pulse (!) 101   Ht 4' 11.5" (1.511 m)   Wt 145 lb 12.8 oz (66.1 kg)   SpO2 90%   BMI 28.96 kg/m  Wt Readings from Last 3 Encounters:  04/19/23 145 lb 12.8 oz (66.1 kg)  04/03/23 143 lb 6.4 oz (65 kg)  03/18/23 140 lb (63.5 kg)        Assessment & Plan:   Problem List Items Addressed This Visit       Cardiovascular and Mediastinum   Essential hypertension - Primary   BP Readings from Last 1 Encounters:  04/19/23 138/76   Well-controlled with telmisartan-HCTZ 80-25 mg QD and amlodipine 5 mg once daily now Counseled for compliance with the medications Advised DASH diet and moderate exercise/walking, at least 150 mins/week        Respiratory   Chronic obstructive pulmonary disease (HCC)   Recently completed azithromycin and Sterapred taper for COPD exacerbation Chronic, intermittent dyspnea, worse with exertion and cold, dry air Needs to quit smoking Albuterol as needed for now Has Breztri as maintenance inhaler, but needs to use it BID - did not  tolerate Trelegy Cheratussin as needed for cough      Relevant Medications   fluticasone (FLONASE) 50 MCG/ACT nasal spray   magic mouthwash (lidocaine, diphenhydrAMINE, alum & mag hydroxide) suspension   guaiFENesin-codeine 100-10 MG/5ML syrup   Chronic sinusitis   Chronic nasal congestion likely contributing to postnasal drip, eustachian tube dysfunction and chronic sore throat Has been referred to ENT specialist Advised to use Flonase instead of decongestant nasal spray      Relevant Medications   fluticasone (FLONASE) 50 MCG/ACT nasal spray   magic mouthwash (lidocaine, diphenhydrAMINE, alum & mag hydroxide) suspension   guaiFENesin-codeine 100-10 MG/5ML syrup     Other   Odynophagia   Considering her severe  odynophagia and inability to tolerate solid food, urgent referral sent to GI  for possible EGD Check x-ray esophagram She has Magic mouthwash, as needed for sore throat      Relevant Orders   DG ESOPHAGUS W SINGLE CM (SOL OR THIN BA)   Ambulatory referral to Gastroenterology   Leg swelling   Likely due to amlodipine Chronic venous insufficiency can also lead to leg swelling Advised to perform leg elevation Use compression socks as needed Already on hydrochlorothiazide for HTN      Other Visit Diagnoses       Sore throat       Relevant Medications   magic mouthwash (lidocaine, diphenhydrAMINE, alum & mag hydroxide) suspension     Encounter for immunization       Relevant Orders   Flu vaccine trivalent PF, 6mos and older(Flulaval,Afluria,Fluarix,Fluzone) (Completed)         Meds ordered this encounter  Medications   fluticasone (FLONASE) 50 MCG/ACT nasal spray    Sig: Place 2 sprays into both nostrils daily.    Dispense:  16 g    Refill:  1   magic mouthwash (lidocaine, diphenhydrAMINE, alum & mag hydroxide) suspension    Sig: Swish and swallow 5 mLs 3 (three) times daily as needed for mouth pain.    Dispense:  360 mL    Refill:  0   guaiFENesin-codeine 100-10 MG/5ML syrup    Sig: Take 5 mLs by mouth 3 (three) times daily as needed for cough.    Dispense:  120 mL    Refill:  0     Cloy Cozzens Concha Se, MD

## 2023-04-19 NOTE — Assessment & Plan Note (Signed)
Considering her severe odynophagia and inability to tolerate solid food, urgent referral sent to GI for possible EGD Check x-ray esophagram She has Magic mouthwash, as needed for sore throat

## 2023-04-19 NOTE — Assessment & Plan Note (Signed)
Recently completed azithromycin and Sterapred taper for COPD exacerbation Chronic, intermittent dyspnea, worse with exertion and cold, dry air Needs to quit smoking Albuterol as needed for now Has Breztri as maintenance inhaler, but needs to use it BID - did not  tolerate Trelegy Cheratussin as needed for cough

## 2023-04-19 NOTE — Patient Instructions (Addendum)
Please continue to take medications as prescribed.  Please continue to follow low salt diet and perform moderate exercise/walking at least 150 mins/week.  Please start using Flonase as prescribed.  Please contact ENT office: [address] [place],  [place]  [address] Main: [phone]

## 2023-04-20 ENCOUNTER — Encounter (INDEPENDENT_AMBULATORY_CARE_PROVIDER_SITE_OTHER): Payer: Self-pay | Admitting: *Deleted

## 2023-04-28 ENCOUNTER — Telehealth (INDEPENDENT_AMBULATORY_CARE_PROVIDER_SITE_OTHER): Payer: Self-pay | Admitting: Otolaryngology

## 2023-04-28 NOTE — Telephone Encounter (Signed)
Confirmed appt & location [id number] afm

## 2023-05-01 ENCOUNTER — Institutional Professional Consult (permissible substitution) (INDEPENDENT_AMBULATORY_CARE_PROVIDER_SITE_OTHER): Payer: No Typology Code available for payment source

## 2023-05-01 NOTE — Progress Notes (Deleted)
 Dear Dr. Allena Katz, Here is my assessment for our mutual patient, [name]. Thank you for allowing me the opportunity to care for your patient. Please do not hesitate to contact me should you have any other questions. Sincerely, Dr. Jovita Kussmaul  Otolaryngology Clinic Note Referring provider: Dr. Allena Katz HPI:  Hanya Guerin is a 63 y.o. female kindly referred by Dr. Allena Katz for evaluation of ***.   H&N Surgery: *** Personal or FHx of bleeding dz or anesthesia difficulty: no ***  GLP-1: *** AP/AC: ***  Tobacco: ***. Alcohol: ***. Occupation: ***. Lives in *** with ***.  PMHx: COPD, HTN  Independent Review of Additional Tests or Records:  Dr. Delia Chimes [facility]) - 04/03/2023 -- persistent cough with thick mucus for 2 months, Rx steroids and doxy + Z-pak. Noted dyspnea and using breztri. Persistent sore throat, noted NP cyst in 2020. Dx: COPD, Thronwaldt's cyst; Rx: Ref ENT, Z-pak, PPI, Pred pack Dr. 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) - unchanged compared to 09/2018 with small calcification; no other obvious enhancing masses noted; more inferiorly, carotid is quite medialized; b/l SMG enlarged (more so in July 2020)  PMH/Meds/All/SocHx/FamHx/ROS:   Past Medical History:  Diagnosis Date   Anxiety    Arthritis    Cholecystitis 09/11/2017   Constipation    COPD (chronic obstructive pulmonary disease) (HCC)    Family history of adverse reaction to anesthesia    brother was "allergic" to anesthesia - heart stopped beating on the surgery table   Gallbladder sludge    GERD (gastroesophageal reflux disease)    Headache    used to have migraines   Hypertension    Palpitations    Proximal phalanx  fracture of finger 03/31/2015   Staph infection      Past Surgical History:  Procedure Laterality Date   CHOLECYSTECTOMY N/A 09/13/2017   Procedure: LAPAROSCOPIC CHOLECYSTECTOMY;  Surgeon: Lucretia Roers, MD;  Location: [facility];  Service: General;  Laterality: N/A;   GROIN DEBRIDEMENT     due to spider bite,    OPEN REDUCTION INTERNAL FIXATION (ORIF) PROXIMAL PHALANX Right 04/07/2015   Procedure: OPEN REDUCTION INTERNAL FIXATION (ORIF) PROXIMAL PHALANX;  Surgeon: Cammy Copa, MD;  Location: [facility];  Service: Orthopedics;  Laterality: Right;   TONSILLECTOMY     TUBAL LIGATION      Family History  Problem Relation Age of Onset   COPD Mother    Diabetes Father      Social Connections: Not on file      Current Outpatient Medications:    albuterol (VENTOLIN HFA) 108 (90 Base) MCG/ACT inhaler, Inhale 2 puffs into the lungs every 6 (six) hours as needed for wheezing or shortness of breath., Disp: 18 g, Rfl: 5   amLODipine (NORVASC) 5 MG tablet, Take 1 tablet (5 mg total) by mouth daily., Disp: 30 tablet, Rfl: 1   Budeson-Glycopyrrol-Formoterol (BREZTRI AEROSPHERE) 160-9-4.8 MCG/ACT AERO, Inhale 2 puffs into the lungs 2 (two) times daily., Disp: 10.7 g, Rfl: 11   fluticasone (FLONASE) 50 MCG/ACT nasal spray, Place 2 sprays into both nostrils daily., Disp: 16 g, Rfl: 1   gabapentin (NEURONTIN) 300 MG capsule, Take 1 capsule (300 mg total) by mouth at bedtime., Disp: 30 capsule, Rfl:  3   guaiFENesin-codeine 100-10 MG/5ML syrup, Take 5 mLs by mouth 3 (three) times daily as needed for cough., Disp: 120 mL, Rfl: 0   ibuprofen (ADVIL) 800 MG tablet, Take 1 tablet (800 mg total) by mouth every 8 (eight) hours as needed., Disp: 30 tablet, Rfl: 0   magic mouthwash (lidocaine, diphenhydrAMINE, alum & mag hydroxide) suspension, Swish and swallow 5 mLs 3 (three) times daily as needed for mouth pain., Disp: 360 mL, Rfl: 0   omeprazole (PRILOSEC) 40 MG capsule, Take 1 capsule (40 mg total) by mouth  daily., Disp: 30 capsule, Rfl: 3   ondansetron (ZOFRAN-ODT) 4 MG disintegrating tablet, Take 1 tablet (4 mg total) by mouth every 8 (eight) hours as needed for nausea or vomiting., Disp: 20 tablet, Rfl: 0   telmisartan-hydrochlorothiazide (MICARDIS HCT) 80-25 MG tablet, Take 1 tablet by mouth daily., Disp: 30 tablet, Rfl: 3   Physical Exam:   There were no vitals taken for this visit.  Salient findings:  CN II-XII intact *** Bilateral EAC clear and TM intact with well pneumatized middle ear spaces Weber 512: *** Rinne 512: AC > BC b/l *** Rine 1024: AC > BC b/l *** Anterior rhinoscopy: Septum ***; bilateral inferior turbinates with *** No lesions of oral cavity/oropharynx; dentition *** No obviously palpable neck masses/lymphadenopathy/thyromegaly No respiratory distress or stridor***  Seprately Identifiable Procedures:  Procedure Note Pre-procedure diagnosis:  Dysphonia *** Post-procedure diagnosis: Same Procedure: Transnasal Fiberoptic Laryngoscopy, CPT 31575 - Mod 25 Indication: *** Complications: None apparent EBL: 0 mL  The procedure was undertaken to further evaluate the patient's complaint of ***, with mirror exam inadequate for appropriate examination due to gag reflex and poor patient tolerance  Procedure:  Patient was identified as correct patient. Verbal consent was obtained. The nose was sprayed with oxymetazoline and 4% lidocaine. The The flexible laryngoscope was passed through the nose to view the nasal cavity, pharynx (oropharynx, hypopharynx) and larynx.  The larynx was examined at rest and during multiple phonatory tasks. Documentation was obtained and reviewed with patient. The scope was removed. The patient tolerated the procedure well.  Findings: The nasal cavity and nasopharynx did not reveal any masses or lesions, mucosa appeared to be without obvious lesions. The tongue base, pharyngeal walls, piriform sinuses, vallecula, epiglottis and postcricoid region are  normal in appearance EXCEPT: ***. The visualized portion of the subglottis and proximal trachea is widely patent. The vocal folds are mobile bilaterally. There are no lesions on the free edge of the vocal folds nor elsewhere in the larynx worrisome for malignancy.    Electronically signed by: Read Drivers, MD 05/01/2023 7:58 AM   Impression & Plans:  Lizzete Gough is a 63 y.o. female with ***  No diagnosis found.   - f/u ***  See below regarding exact medications prescribed this encounter including dosages and route: No orders of the defined types were placed in this encounter.     Thank you for allowing me the opportunity to care for your patient. Please do not hesitate to contact me should you have any other questions.  Sincerely, Jovita Kussmaul, MD Otolaryngologist (ENT), [facility] Phone: [phone] Fax: [phone]  05/01/2023, 7:53 AM   MDM:  Level *** Complexity/Problems addressed: *** Data complexity: *** independent review of *** - Morbidity: ***  - Prescription Drug prescribed or managed: ***

## 2023-05-23 ENCOUNTER — Ambulatory Visit (HOSPITAL_COMMUNITY): Payer: No Typology Code available for payment source | Attending: Internal Medicine

## 2023-05-29 ENCOUNTER — Telehealth (INDEPENDENT_AMBULATORY_CARE_PROVIDER_SITE_OTHER): Payer: Self-pay | Admitting: Gastroenterology

## 2023-05-29 ENCOUNTER — Ambulatory Visit (INDEPENDENT_AMBULATORY_CARE_PROVIDER_SITE_OTHER): Payer: No Typology Code available for payment source | Admitting: Gastroenterology

## 2023-05-29 NOTE — Telephone Encounter (Signed)
 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 Looks like she seen Marletta Lor in the past, not sure why on [place] schedule.

## 2023-05-29 NOTE — Telephone Encounter (Signed)
 Patient isn't scheduled for anything by [place]. Looks like she was a new patient for Dr. Levon Hedger.

## 2023-05-29 NOTE — Telephone Encounter (Signed)
 Pt cancelled OV for today because she needs to reschedule her CT. She will reschedule once she has CT done.

## 2023-06-19 ENCOUNTER — Encounter: Payer: Self-pay | Admitting: Internal Medicine

## 2023-06-19 ENCOUNTER — Ambulatory Visit (INDEPENDENT_AMBULATORY_CARE_PROVIDER_SITE_OTHER): Payer: No Typology Code available for payment source | Admitting: Internal Medicine

## 2023-06-19 VITALS — BP 152/84 | HR 84 | Ht 59.5 in | Wt 134.8 lb

## 2023-06-19 DIAGNOSIS — M5416 Radiculopathy, lumbar region: Secondary | ICD-10-CM

## 2023-06-19 DIAGNOSIS — J449 Chronic obstructive pulmonary disease, unspecified: Secondary | ICD-10-CM | POA: Diagnosis not present

## 2023-06-19 DIAGNOSIS — J329 Chronic sinusitis, unspecified: Secondary | ICD-10-CM

## 2023-06-19 DIAGNOSIS — J439 Emphysema, unspecified: Secondary | ICD-10-CM

## 2023-06-19 DIAGNOSIS — Z1211 Encounter for screening for malignant neoplasm of colon: Secondary | ICD-10-CM

## 2023-06-19 DIAGNOSIS — R92323 Mammographic fibroglandular density, bilateral breasts: Secondary | ICD-10-CM

## 2023-06-19 DIAGNOSIS — D179 Benign lipomatous neoplasm, unspecified: Secondary | ICD-10-CM | POA: Insufficient documentation

## 2023-06-19 DIAGNOSIS — I10 Essential (primary) hypertension: Secondary | ICD-10-CM | POA: Diagnosis not present

## 2023-06-19 DIAGNOSIS — J392 Other diseases of pharynx: Secondary | ICD-10-CM

## 2023-06-19 MED ORDER — TELMISARTAN-HCTZ 80-25 MG PO TABS: 1.0000 | ORAL_TABLET | Freq: Every day | ORAL | 1 refills | Status: DC

## 2023-06-19 MED ORDER — AMLODIPINE BESYLATE 10 MG PO TABS: 10.0000 mg | ORAL_TABLET | Freq: Every day | ORAL | 1 refills | Status: DC

## 2023-06-19 MED ORDER — CYCLOBENZAPRINE HCL 10 MG PO TABS: 10.0000 mg | ORAL_TABLET | Freq: Every day | ORAL | 3 refills | Status: DC

## 2023-06-19 NOTE — Assessment & Plan Note (Signed)
 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

## 2023-06-19 NOTE — Assessment & Plan Note (Signed)
 Chronic nasal congestion likely contributing to postnasal drip, eustachian tube dysfunction and chronic sore throat Has been referred to ENT specialist Advised to use Flonase instead of decongestant nasal spray

## 2023-06-19 NOTE — Progress Notes (Signed)
 Established Patient Office Visit  Subjective:  Patient ID: [name], female    DOB: Dec 26, 1960  Age: 63 y.o. MRN: [id number]  CC:  Chief Complaint  Patient presents with   Care Management    2 month f/u, reports sx of knots everywhere noticed this about a year ago and keep getting worse.     HPI [name] is a 63 y.o. female with past medical history of HTN, COPD and tobacco abuse who presents for f/u of her chronic medical conditions.  Her BP was elevated today.  She takes telmisartan-hydrochlorothiazide 80-25 mg once daily and amlodipine 5 mg once daily now.  Her BP remains around 140s/80s at home as well now and attributes it to low back pain.  Denies chest pain or palpitations currently.   She reports chronic low back pain, which is sharp, radiating to bilateral LE and is associated with numbness of the LE.  She has history of DDD of lumbar spine.  She also reports numbness of the hands and forearms, especially worse at night time.  She was placed on gabapentin 100 mg nightly, which helped her with severe pain, but still complains of pain with radicular symptoms. She had dizziness with Gabapentin 300 mg and has stopped taking it now.  COPD: She has noticed improvement in persistent cough with thick mucoid expectoration since starting Breztri.  She has been taking guaifenesin-codeine syrup as needed for cough as well.  She has dyspnea and wheezing as well, but improves with albuterol inhaler.  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 She was referred to ENT specialist, but could not see them due to acute URTI. She requests a new referral. She was referred to GI for dysphagia, but was told to get CT neck done before being scheduled.  She also reports multiple bumps over trunk area, especially over breast area and mid back area.  Previous mammography had showed multiple lipomas.  She is due  for mammography.    Past Medical History:  Diagnosis Date   Anxiety    Arthritis    Cholecystitis 09/11/2017   Constipation    COPD (chronic obstructive pulmonary disease) (HCC)    Family history of adverse reaction to anesthesia    brother was "allergic" to anesthesia - heart stopped beating on the surgery table   Gallbladder sludge    GERD (gastroesophageal reflux disease)    Headache    used to have migraines   Hypertension    Palpitations    Proximal phalanx fracture of finger 03/31/2015   Staph infection     Past Surgical History:  Procedure Laterality Date   CHOLECYSTECTOMY N/A 09/13/2017   Procedure: LAPAROSCOPIC CHOLECYSTECTOMY;  Surgeon: Lucretia Roers, MD;  Location: [facility];  Service: General;  Laterality: N/A;   GROIN DEBRIDEMENT     due to spider bite,    OPEN REDUCTION INTERNAL FIXATION (ORIF) PROXIMAL PHALANX Right 04/07/2015   Procedure: OPEN REDUCTION INTERNAL FIXATION (ORIF) PROXIMAL PHALANX;  Surgeon: Cammy Copa, MD;  Location: [facility];  Service: Orthopedics;  Laterality: Right;   TONSILLECTOMY     TUBAL LIGATION      Family History  Problem Relation Age of Onset   COPD Mother    Diabetes Father     Social History   Socioeconomic History   Marital status: Married    Spouse name: Not on file   Number of children: Not  on file   Years of education: Not on file   Highest education level: Not on file  Occupational History   Not on file  Tobacco Use   Smoking status: Some Days    Current packs/day: 0.50    Types: Cigarettes   Smokeless tobacco: Never  Vaping Use   Vaping status: Never Used  Substance and Sexual Activity   Alcohol use: Yes    Comment: wine occ   Drug use: No   Sexual activity: Not on file  Other Topics Concern   Not on file  Social History Narrative   Not on file   Social Drivers of Health   Financial Resource Strain: Not on file  Food Insecurity: Not on file  Transportation Needs: Not on file  Physical Activity:  Not on file  Stress: Not on file  Social Connections: Not on file  Intimate Partner Violence: Not on file    Outpatient Medications Prior to Visit  Medication Sig Dispense Refill   albuterol (VENTOLIN HFA) 108 (90 Base) MCG/ACT inhaler Inhale 2 puffs into the lungs every 6 (six) hours as needed for wheezing or shortness of breath. 18 g 5   Budeson-Glycopyrrol-Formoterol (BREZTRI AEROSPHERE) 160-9-4.8 MCG/ACT AERO Inhale 2 puffs into the lungs 2 (two) times daily. 10.7 g 11   fluticasone (FLONASE) 50 MCG/ACT nasal spray Place 2 sprays into both nostrils daily. 16 g 1   guaiFENesin-codeine 100-10 MG/5ML syrup Take 5 mLs by mouth 3 (three) times daily as needed for cough. 120 mL 0   ibuprofen (ADVIL) 800 MG tablet Take 1 tablet (800 mg total) by mouth every 8 (eight) hours as needed. 30 tablet 0   magic mouthwash (lidocaine, diphenhydrAMINE, alum & mag hydroxide) suspension Swish and swallow 5 mLs 3 (three) times daily as needed for mouth pain. 360 mL 0   omeprazole (PRILOSEC) 40 MG capsule Take 1 capsule (40 mg total) by mouth daily. 30 capsule 3   ondansetron (ZOFRAN-ODT) 4 MG disintegrating tablet Take 1 tablet (4 mg total) by mouth every 8 (eight) hours as needed for nausea or vomiting. 20 tablet 0   amLODipine (NORVASC) 5 MG tablet Take 1 tablet (5 mg total) by mouth daily. 30 tablet 1   gabapentin (NEURONTIN) 300 MG capsule Take 1 capsule (300 mg total) by mouth at bedtime. 30 capsule 3   telmisartan-hydrochlorothiazide (MICARDIS HCT) 80-25 MG tablet Take 1 tablet by mouth daily. 30 tablet 3   No facility-administered medications prior to visit.    Allergies  Allergen Reactions   Penicillins Swelling    Throat swelling Has patient had a PCN reaction causing immediate rash, facial/tongue/throat swelling, SOB or lightheadedness with hypotension: Yes Has patient had a PCN reaction causing severe rash involving mucus membranes or skin necrosis: No Has patient had a PCN reaction that  required hospitalization  ED visit but no hospitalization Has patient had a PCN reaction occurring within the last 10 years: No If all of the above answers are "NO", then may proceed with Cephalosporin use.    ROS Review of Systems  Constitutional:  Negative for chills and fever.  HENT:  Positive for congestion and sore throat.   Eyes:  Negative for pain and discharge.  Respiratory:  Positive for shortness of breath. Negative for cough.   Cardiovascular:  Negative for chest pain and palpitations.  Gastrointestinal:  Positive for constipation. Negative for abdominal pain, diarrhea, nausea and vomiting.  Endocrine: Negative for polydipsia and polyuria.  Genitourinary:  Negative for dysuria and  hematuria.  Musculoskeletal:  Positive for arthralgias, back pain and neck pain. Negative for neck stiffness.  Skin:  Negative for rash.       Skin bumps  Neurological:  Positive for numbness. Negative for dizziness and weakness.  Psychiatric/Behavioral:  Negative for agitation and behavioral problems.       Objective:    Physical Exam Vitals reviewed.  Constitutional:      General: She is not in acute distress.    Appearance: She is not diaphoretic.  HENT:     Head: Normocephalic and atraumatic.     Nose: Congestion present.     Mouth/Throat:     Mouth: Mucous membranes are moist.  Eyes:     General: No scleral icterus.    Extraocular Movements: Extraocular movements intact.  Cardiovascular:     Rate and Rhythm: Normal rate and regular rhythm.     Heart sounds: Normal heart sounds. No murmur heard. Pulmonary:     Breath sounds: Normal breath sounds. No wheezing or rales.  Abdominal:     Palpations: Abdomen is soft.     Tenderness: There is no abdominal tenderness.  Musculoskeletal:     Right hand: Tenderness present.     Left hand: Tenderness present.     Cervical back: Neck supple. No tenderness.     Right lower leg: No edema.     Left lower leg: No edema.     Comments:  Heberden and Bouchard nodes bilaterally Trigger finger of left ring finger  Skin:    General: Skin is warm.     Findings: No rash.     Comments: Multiple lipomas over breast area and mid back area  Neurological:     General: No focal deficit present.     Mental Status: She is alert and oriented to person, place, and time.     Sensory: Sensory deficit (B/l hands) present.     Motor: No weakness.  Psychiatric:        Mood and Affect: Mood normal.        Behavior: Behavior normal.     BP (!) 152/84 (BP Location: Left Arm)   Pulse 84   Ht 4' 11.5" (1.511 m)   Wt 134 lb 12.8 oz (61.1 kg)   SpO2 90%   BMI 26.77 kg/m  Wt Readings from Last 3 Encounters:  06/19/23 134 lb 12.8 oz (61.1 kg)  04/19/23 145 lb 12.8 oz (66.1 kg)  04/03/23 143 lb 6.4 oz (65 kg)    Lab Results  Component Value Date   TSH 3.372 09/28/2018   Lab Results  Component Value Date   WBC 9.8 03/18/2023   HGB 17.0 (H) 03/18/2023   HCT 52.3 (H) 03/18/2023   MCV 93.1 03/18/2023   PLT 259 03/18/2023   Lab Results  Component Value Date   NA 136 03/18/2023   K 3.4 (L) 03/18/2023   CO2 27 03/18/2023   GLUCOSE 118 (H) 03/18/2023   BUN 11 03/18/2023   CREATININE 0.54 03/18/2023   BILITOT 0.6 03/05/2023   ALKPHOS 74 03/05/2023   AST 44 (H) 03/05/2023   ALT 36 03/05/2023   PROT 7.5 03/05/2023   ALBUMIN 3.5 03/05/2023   CALCIUM 9.6 03/18/2023   ANIONGAP 11 03/18/2023   EGFR 97 03/07/2022   Lab Results  Component Value Date   CHOL 151 02/02/2022   Lab Results  Component Value Date   HDL 54 02/02/2022   Lab Results  Component Value Date   LDLCALC 67  02/02/2022   Lab Results  Component Value Date   TRIG 178 (H) 02/02/2022   Lab Results  Component Value Date   CHOLHDL 2.8 02/02/2022   Lab Results  Component Value Date   HGBA1C 5.9 (H) 02/02/2022      Assessment & Plan:   Problem List Items Addressed This Visit       Cardiovascular and Mediastinum   Essential hypertension - Primary    BP Readings from Last 1 Encounters:  06/19/23 (!) 152/84   Uncontrolled with telmisartan-HCTZ 80-25 mg QD and amlodipine 5 mg QD now Increased dose of amlodipine to 10 mg QD Counseled for compliance with the medications Advised DASH diet and moderate exercise/walking, at least 150 mins/week      Relevant Medications   amLODipine (NORVASC) 10 MG tablet   telmisartan-hydrochlorothiazide (MICARDIS HCT) 80-25 MG tablet     Respiratory   Chronic obstructive pulmonary disease (HCC)   Recently completed azithromycin and Sterapred taper for COPD exacerbation Chronic, intermittent dyspnea, worse with exertion and cold, dry air Needs to quit smoking Albuterol as needed for now Has Breztri as maintenance inhaler, but needs to use it BID - did not  tolerate Trelegy Cheratussin as needed for cough      Thornwaldt's cyst   Usually benign, but considering her recurrent sinus symptoms and sore throat - will recheck CT neck, needs to schedule it Referred to ENT specialist      Relevant Orders   Ambulatory referral to ENT   Chronic sinusitis   Chronic nasal congestion likely contributing to postnasal drip, eustachian tube dysfunction and chronic sore throat Has been referred to ENT specialist Advised to use Flonase instead of decongestant nasal spray      Relevant Orders   Ambulatory referral to ENT     Nervous and Auditory   Lumbar radiculopathy, chronic   Has chronic low back pain with radicular symptoms Has history of DDD of lumbar spine Had given gabapentin for neuropathic pain, had slight improvement - later increased dose of gabapentin to 300 mg nightly, but she stopped it due to dizziness Flexeril 10 mg at bedtime PRN for muscle spasms Referred to PT      Relevant Medications   cyclobenzaprine (FLEXERIL) 10 MG tablet   Other Relevant Orders   Ambulatory referral to Physical Therapy     Other   Lipoma   Has multiple lipomas over trunk area, especially breast and mid  back area Reassured it being benign Check mammography and ultrasound of breasts If increase in size, can get dermatology evaluation      Other Visit Diagnoses       Colon cancer screening       Relevant Orders   Ambulatory referral to Gastroenterology     Mammographic fibroglandular density, bilateral breasts       Relevant Orders   MM 3D DIAGNOSTIC MAMMOGRAM BILATERAL BREAST   US BREAST COMPLETE UNI LEFT INC AXILLA   US BREAST COMPLETE UNI RIGHT INC AXILLA       Meds ordered this encounter  Medications   cyclobenzaprine (FLEXERIL) 10 MG tablet    Sig: Take 1 tablet (10 mg total) by mouth at bedtime.    Dispense:  30 tablet    Refill:  3   amLODipine (NORVASC) 10 MG tablet    Sig: Take 1 tablet (10 mg total) by mouth daily.    Dispense:  90 tablet    Refill:  1   telmisartan-hydrochlorothiazide (MICARDIS HCT) 80-25 MG tablet  Sig: Take 1 tablet by mouth daily.    Dispense:  90 tablet    Refill:  1    Follow-up: Return in about 3 months (around 09/19/2023) for HTN and COPD.    Anabel Halon, MD

## 2023-06-19 NOTE — Patient Instructions (Signed)
 Please start taking Amlodipine 10 mg once daily instead of 5 mg.  Please take Cyclobenzaprine 10 mg at bedtime.  Please continue to take medications as prescribed.  Please continue to follow low salt diet and ambulate as tolerated.

## 2023-06-19 NOTE — Assessment & Plan Note (Addendum)
 Has multiple lipomas over trunk area, especially breast and mid back area Reassured it being benign Check mammography and ultrasound of breasts If increase in size, can get dermatology evaluation

## 2023-06-19 NOTE — Assessment & Plan Note (Signed)
 Recently completed azithromycin and Sterapred taper for COPD exacerbation Chronic, intermittent dyspnea, worse with exertion and cold, dry air Needs to quit smoking Albuterol as needed for now Has Breztri as maintenance inhaler, but needs to use it BID - did not  tolerate Trelegy Cheratussin as needed for cough

## 2023-06-19 NOTE — Assessment & Plan Note (Signed)
 Usually benign, but considering her recurrent sinus symptoms and sore throat - will recheck CT neck, needs to schedule it Referred to ENT specialist

## 2023-06-19 NOTE — Assessment & Plan Note (Addendum)
 BP Readings from Last 1 Encounters:  06/19/23 (!) 152/84   Uncontrolled with telmisartan-HCTZ 80-25 mg QD and amlodipine 5 mg QD now Increased dose of amlodipine to 10 mg QD Counseled for compliance with the medications Advised DASH diet and moderate exercise/walking, at least 150 mins/week

## 2023-06-21 ENCOUNTER — Encounter (INDEPENDENT_AMBULATORY_CARE_PROVIDER_SITE_OTHER): Payer: Self-pay | Admitting: *Deleted

## 2023-07-14 ENCOUNTER — Ambulatory Visit (HOSPITAL_COMMUNITY)
Admission: RE | Admit: 2023-07-14 | Discharge: 2023-07-14 | Disposition: A | Discharge status: Home / Self Care | Source: Ambulatory Visit | Attending: Internal Medicine | Admitting: Internal Medicine

## 2023-07-14 ENCOUNTER — Other Ambulatory Visit: Payer: Self-pay | Admitting: Internal Medicine

## 2023-07-14 DIAGNOSIS — R1310 Dysphagia, unspecified: Secondary | ICD-10-CM | POA: Insufficient documentation

## 2023-07-14 DIAGNOSIS — J029 Acute pharyngitis, unspecified: Secondary | ICD-10-CM

## 2023-07-14 DIAGNOSIS — J441 Chronic obstructive pulmonary disease with (acute) exacerbation: Secondary | ICD-10-CM

## 2023-07-14 DIAGNOSIS — Z23 Encounter for immunization: Secondary | ICD-10-CM

## 2023-07-14 DIAGNOSIS — M7989 Other specified soft tissue disorders: Secondary | ICD-10-CM

## 2023-07-14 DIAGNOSIS — J392 Other diseases of pharynx: Secondary | ICD-10-CM

## 2023-07-14 DIAGNOSIS — J329 Chronic sinusitis, unspecified: Secondary | ICD-10-CM

## 2023-07-14 DIAGNOSIS — I10 Essential (primary) hypertension: Secondary | ICD-10-CM

## 2023-07-18 ENCOUNTER — Ambulatory Visit (HOSPITAL_COMMUNITY)

## 2023-07-19 ENCOUNTER — Ambulatory Visit: Admitting: Internal Medicine

## 2023-07-20 ENCOUNTER — Ambulatory Visit (HOSPITAL_COMMUNITY)
Admission: RE | Admit: 2023-07-20 | Discharge: 2023-07-20 | Disposition: A | Discharge status: Home / Self Care | Source: Ambulatory Visit | Attending: Internal Medicine | Admitting: Internal Medicine

## 2023-07-20 ENCOUNTER — Other Ambulatory Visit: Payer: Self-pay | Admitting: Internal Medicine

## 2023-07-20 DIAGNOSIS — M5416 Radiculopathy, lumbar region: Secondary | ICD-10-CM

## 2023-07-20 DIAGNOSIS — J449 Chronic obstructive pulmonary disease, unspecified: Secondary | ICD-10-CM

## 2023-07-20 DIAGNOSIS — Z1211 Encounter for screening for malignant neoplasm of colon: Secondary | ICD-10-CM

## 2023-07-20 DIAGNOSIS — J329 Chronic sinusitis, unspecified: Secondary | ICD-10-CM

## 2023-07-20 DIAGNOSIS — R92323 Mammographic fibroglandular density, bilateral breasts: Secondary | ICD-10-CM | POA: Diagnosis present

## 2023-07-20 DIAGNOSIS — D179 Benign lipomatous neoplasm, unspecified: Secondary | ICD-10-CM

## 2023-07-20 DIAGNOSIS — I10 Essential (primary) hypertension: Secondary | ICD-10-CM

## 2023-07-20 DIAGNOSIS — J439 Emphysema, unspecified: Secondary | ICD-10-CM

## 2023-07-20 DIAGNOSIS — J392 Other diseases of pharynx: Secondary | ICD-10-CM

## 2023-08-02 ENCOUNTER — Other Ambulatory Visit: Payer: Self-pay

## 2023-08-02 ENCOUNTER — Telehealth: Payer: Self-pay

## 2023-08-02 DIAGNOSIS — Z20822 Contact with and (suspected) exposure to covid-19: Secondary | ICD-10-CM

## 2023-08-02 NOTE — Telephone Encounter (Unsigned)
 Copied from CRM [id number]. Topic: Clinical - Prescription Issue >> Aug 02, 2023  2:00 PM Felizardo Hotter wrote: Reason for CRM: Pt returning call stated she would like a prescription for Covid test so insurance can pay for it. Send to: [id number] - , [place] - [address] Please call pt at [phone].

## 2023-08-02 NOTE — Telephone Encounter (Signed)
 Copied from CRM [id number]. Topic: Clinical - Medical Advice >> Aug 02, 2023  1:16 PM Thliyah D wrote: Patient and her husband have been exposed to COVID and want to know can the office call in test for them. Call back [phone]

## 2023-08-02 NOTE — Telephone Encounter (Signed)
 Left vm for pt letting her know test can be bought otc.

## 2023-08-08 ENCOUNTER — Ambulatory Visit: Payer: Self-pay

## 2023-08-09 ENCOUNTER — Ambulatory Visit (HOSPITAL_COMMUNITY)

## 2023-08-09 ENCOUNTER — Telehealth (HOSPITAL_COMMUNITY): Payer: Self-pay

## 2023-08-09 NOTE — Telephone Encounter (Signed)
 Spoke with patient  about missed appointment today for evaluation who states she wants to have her appointment after 11 as her husband works 3rd shift; spoke with front desk who will reschedule.  9:26 AM, 08/09/23 Maxie Slovacek Small Hadasah Brugger MPT [facility] [place] [phone]

## 2023-08-09 NOTE — Therapy (Incomplete)
 OUTPATIENT PHYSICAL THERAPY THORACOLUMBAR EVALUATION   Patient Name: [name] MRN: [id number] DOB:11/23/1960, 63 y.o., female Today's Date: 08/09/2023  END OF SESSION:   Past Medical History:  Diagnosis Date   Anxiety    Arthritis    Cholecystitis 09/11/2017   Constipation    COPD (chronic obstructive pulmonary disease) (HCC)    Family history of adverse reaction to anesthesia    brother was "allergic" to anesthesia - heart stopped beating on the surgery table   Gallbladder sludge    GERD (gastroesophageal reflux disease)    Headache    used to have migraines   Hypertension    Palpitations    Proximal phalanx fracture of finger 03/31/2015   Staph infection    Past Surgical History:  Procedure Laterality Date   CHOLECYSTECTOMY N/A 09/13/2017   Procedure: LAPAROSCOPIC CHOLECYSTECTOMY;  Surgeon: Awilda Bogus, MD;  Location: [facility];  Service: General;  Laterality: N/A;   GROIN DEBRIDEMENT     due to spider bite,    OPEN REDUCTION INTERNAL FIXATION (ORIF) PROXIMAL PHALANX Right 04/07/2015   Procedure: OPEN REDUCTION INTERNAL FIXATION (ORIF) PROXIMAL PHALANX;  Surgeon: Jasmine Mesi, MD;  Location: [facility];  Service: Orthopedics;  Laterality: Right;   TONSILLECTOMY     TUBAL LIGATION     Patient Active Problem List   Diagnosis Date Noted   Lipoma 06/19/2023   Odynophagia 04/19/2023   Chronic sinusitis 04/19/2023   Leg swelling 04/19/2023   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   Chronic gastritis without bleeding 04/03/2023   Lumbar radiculopathy, chronic 03/02/2022   Bilateral sciatica 02/02/2022   Encounter for general adult medical examination with abnormal findings 02/02/2022   Carpal tunnel syndrome on both sides 02/02/2022   Idiopathic peripheral neuropathy 02/02/2022   Cervical radiculopathy 02/02/2022   Preop examination 09/30/2021   Primary osteoarthritis of both hands 08/19/2021   Chronic obstructive pulmonary disease (HCC) 08/19/2021   Trigger ring finger  of left hand 08/19/2021   Chronic idiopathic constipation 08/19/2021   Lymphadenopathy 08/11/2021   Tobacco abuse 09/28/2018   Essential hypertension 09/28/2018    PCP: Meldon Sport, MD  REFERRING PROVIDER: Meldon Sport, MD  REFERRING DIAG: M54.16 (ICD-10-CM) - Lumbar radiculopathy, chronic  Rationale for Evaluation and Treatment: Rehabilitation  THERAPY DIAG:  No diagnosis found.  ONSET DATE: ***  SUBJECTIVE:                                                                                                                                                                                           SUBJECTIVE STATEMENT: ***  PERTINENT HISTORY:  ***  PAIN:  Are you having  pain? {OPRCPAIN:27236}  PRECAUTIONS: {Therapy precautions:24002}  RED FLAGS: {PT Red Flags:29287}   WEIGHT BEARING RESTRICTIONS: {Yes ***/No:24003}  FALLS:  Has patient fallen in last 6 months? {fallsyesno:27318}  LIVING ENVIRONMENT: Lives with: {OPRC lives with:25569::"lives with their family"} Lives in: {Lives in:25570} Stairs: {opstairs:27293} Has following equipment at home: {Assistive devices:23999}  OCCUPATION: ***  PLOF: {PLOF:24004}  PATIENT GOALS: ***  NEXT MD VISIT: ***  OBJECTIVE:  Note: Objective measures were completed at Evaluation unless otherwise noted.  DIAGNOSTIC FINDINGS:  none  PATIENT SURVEYS:  Modified Oswestry ***   COGNITION: Overall cognitive status: {cognition:24006}     SENSATION: {sensation:27233}  MUSCLE LENGTH: Hamstrings: Right *** deg; Left *** deg Andy Bannister test: Right *** deg; Left *** deg  POSTURE: {posture:25561}  PALPATION: ***  LUMBAR ROM:   AROM eval  Flexion   Extension   Right lateral flexion   Left lateral flexion   Right rotation   Left rotation    (Blank rows = not tested)  LOWER EXTREMITY ROM:     {AROM/PROM:27142}  Right eval Left eval  Hip flexion    Hip extension    Hip abduction    Hip adduction    Hip  internal rotation    Hip external rotation    Knee flexion    Knee extension    Ankle dorsiflexion    Ankle plantarflexion    Ankle inversion    Ankle eversion     (Blank rows = not tested)  LOWER EXTREMITY MMT:    MMT Right eval Left eval  Hip flexion    Hip extension    Hip abduction    Hip adduction    Hip internal rotation    Hip external rotation    Knee flexion    Knee extension    Ankle dorsiflexion    Ankle plantarflexion    Ankle inversion    Ankle eversion     (Blank rows = not tested)  LUMBAR SPECIAL TESTS:  {lumbar special test:25242}  FUNCTIONAL TESTS:  5 times sit to stand: *** 2 minute walk test: ***  GAIT: Distance walked: *** Assistive device utilized: {Assistive devices:23999} Level of assistance: {Levels of assistance:24026} Comments: ***  TREATMENT DATE:  07/18/2023  Evaluation: -ROM measured, Strength assessed, HEP prescribed, pt educated on prognosis, findings, and importance of HEP compliance if given.  Manual Therapy: -CPA of Lumbar Spinal segments L3-L5, grade II-III mobilizations -STM of Lumbar Paraspinal musculature  Therapeutic Exercise: -Supine bridges 2 sets of 10 reps, 3 second holds, symptomatic, pt cued for max hip extension -Standing 3 way hip 1 sets 10 reps, bilaterally, pt cued for upright trunk and maintaining of neutral spine -Lateral stepping 3 laps 20 feet per lap, second 2 with RTB around ankles, pt cued for upright posture -Forward lunges, 1 set of 5 reps better performance going into RLE, pt cued for core activation and upright posture  PATIENT EDUCATION:  Education details: Pt was educated on findings of PT evaluation, prognosis, frequency of therapy visits and rationale, attendance policy, and HEP if given.  Education details: Patient educated on exam findings, POC, scope of PT, HEP,  and ***. Person educated: Patient Education method: Explanation, Demonstration, and Handouts Education comprehension: verbalized understanding, returned demonstration, verbal cues required, and tactile cues required  HOME EXERCISE PROGRAM: ***  ASSESSMENT:  CLINICAL IMPRESSION: Patient is a 63 y.o. female who was seen today for physical therapy evaluation and treatment for M54.16 (ICD-10-CM) - Lumbar radiculopathy, chronic.   Patient demonstrates decreased LE strength, abnormal pain rating, and impaired balance. Patient also demonstrates difficulty with ambulation during today's session with decreased stride length and velocity noted. Patient also demonstrates ***. Patient requires ***. Patient would benefit from skilled physical therapy for increased endurance with ambulation, increased LE strength, and balance for improved gait quality, return to higher level of function with ADLs, and progress towards therapy goals.   OBJECTIVE IMPAIRMENTS: {opptimpairments:25111}.   ACTIVITY LIMITATIONS: {activitylimitations:27494}  PARTICIPATION LIMITATIONS: {participationrestrictions:25113}  PERSONAL FACTORS: {Personal factors:25162} are also affecting patient's functional outcome.   REHAB POTENTIAL: {rehabpotential:25112}  CLINICAL DECISION MAKING: {clinical decision making:25114}  EVALUATION COMPLEXITY: {Evaluation complexity:25115}   GOALS: Goals reviewed with patient? {yes/no:20286}  SHORT TERM GOALS: Target date: ***  Pt will be independent with HEP in order to demonstrate participation in Physical Therapy POC.  Baseline: Goal status: {GOALSTATUS:25110}  2.  Pt will report ***/10 pain with mobility in order to demonstrate improved pain with ADLs.  Baseline:  Goal status: {GOALSTATUS:25110}  LONG TERM GOALS: Target date: ***  Pt will improve *** by *** in order to demonstrate improved functional strength to return to desired activities.  Baseline: see objective.  Goal status:  {GOALSTATUS:25110}  2.  Pt will improve 2 MWT by *** in order to demonstrate improved functional ambulatory capacity in community setting.  Baseline: see objective.  Goal status: {GOALSTATUS:25110}  3.  Pt will improve Modified Oswestry score by *** in order to demonstrate improved pain with functional goals and outcomes. Baseline: see objective.  Goal status: {GOALSTATUS:25110}  4.  Pt will report ***/10 pain with mobility in order to demonstrate reduced pain with ADLs lasting greater than 30 minutes.  Baseline: see objective.  Goal status: {GOALSTATUS:25110}   PLAN:  PT FREQUENCY: {rehab frequency:25116}  PT DURATION: {rehab duration:25117}  PLANNED INTERVENTIONS: 97164- PT Re-evaluation, 97110-Therapeutic exercises, 97530- Therapeutic activity, 97112- Neuromuscular re-education, 97535- Self Care, 16109- Manual therapy, [phone]- Gait training, [phone]- Orthotic Fit/training, [phone]- Canalith repositioning, [id number]- Aquatic Therapy, [phone]- Splinting, Patient/Family education, Balance training, Stair training, Taping, Dry Needling, Joint mobilization, Joint manipulation, Spinal manipulation, Spinal mobilization, Scar mobilization, and DME instructions. Aaron Aas  PLAN FOR NEXT SESSION: Review HEP and goals   7:04 AM, 08/09/23 Zakiah Gauthreaux Small Yahshua Thibault MPT [facility] [place] [phone]

## 2023-08-28 ENCOUNTER — Institutional Professional Consult (permissible substitution) (INDEPENDENT_AMBULATORY_CARE_PROVIDER_SITE_OTHER): Admitting: Otolaryngology

## 2023-09-11 ENCOUNTER — Other Ambulatory Visit: Payer: Self-pay

## 2023-09-11 ENCOUNTER — Encounter (HOSPITAL_COMMUNITY): Payer: Self-pay

## 2023-09-11 ENCOUNTER — Ambulatory Visit (HOSPITAL_COMMUNITY): Attending: Internal Medicine

## 2023-09-11 DIAGNOSIS — M5416 Radiculopathy, lumbar region: Secondary | ICD-10-CM | POA: Insufficient documentation

## 2023-09-11 DIAGNOSIS — M6281 Muscle weakness (generalized): Secondary | ICD-10-CM | POA: Diagnosis present

## 2023-09-11 NOTE — Therapy (Addendum)
 OUTPATIENT PHYSICAL THERAPY THORACOLUMBAR EVALUATION   Patient Name: [name] MRN: [id number] DOB:1960-04-28, 63 y.o., female Today's Date: 09/11/2023  END OF SESSION:  PT End of Session - 09/11/23 1309     Visit Number 1    Number of Visits 13    Date for PT Re-Evaluation 10/23/23    Authorization Type [employer]    Authorization Time Period no auth    Progress Note Due on Visit 10    PT Start Time 1308    PT Stop Time 1345    PT Time Calculation (min) 37 min    Activity Tolerance Patient tolerated treatment well    Behavior During Therapy WFL for tasks assessed/performed          Past Medical History:  Diagnosis Date   Anxiety    Arthritis    Cholecystitis 09/11/2017   Constipation    COPD (chronic obstructive pulmonary disease) (HCC)    Family history of adverse reaction to anesthesia    brother was allergic to anesthesia - heart stopped beating on the surgery table   Gallbladder sludge    GERD (gastroesophageal reflux disease)    Headache    used to have migraines   Hypertension    Palpitations    Proximal phalanx fracture of finger 03/31/2015   Staph infection    Past Surgical History:  Procedure Laterality Date   CHOLECYSTECTOMY N/A 09/13/2017   Procedure: LAPAROSCOPIC CHOLECYSTECTOMY;  Surgeon: Awilda Bogus, MD;  Location: [facility];  Service: General;  Laterality: N/A;   GROIN DEBRIDEMENT     due to spider bite,    OPEN REDUCTION INTERNAL FIXATION (ORIF) PROXIMAL PHALANX Right 04/07/2015   Procedure: OPEN REDUCTION INTERNAL FIXATION (ORIF) PROXIMAL PHALANX;  Surgeon: Jasmine Mesi, MD;  Location: [facility];  Service: Orthopedics;  Laterality: Right;   TONSILLECTOMY     TUBAL LIGATION     Patient Active Problem List   Diagnosis Date Noted   Lipoma 06/19/2023   Odynophagia 04/19/2023   Chronic sinusitis 04/19/2023   Leg swelling 04/19/2023   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   Chronic gastritis without bleeding 04/03/2023   Lumbar radiculopathy, chronic  03/02/2022   Bilateral sciatica 02/02/2022   Encounter for general adult medical examination with abnormal findings 02/02/2022   Carpal tunnel syndrome on both sides 02/02/2022   Idiopathic peripheral neuropathy 02/02/2022   Cervical radiculopathy 02/02/2022   Preop examination 09/30/2021   Primary osteoarthritis of both hands 08/19/2021   Chronic obstructive pulmonary disease (HCC) 08/19/2021   Trigger ring finger of left hand 08/19/2021   Chronic idiopathic constipation 08/19/2021   Lymphadenopathy 08/11/2021   Tobacco abuse 09/28/2018   Essential hypertension 09/28/2018    PCP: Meldon Sport, MD  REFERRING PROVIDER: Meldon Sport, MD  REFERRING DIAG: M54.16 (ICD-10-CM) - Lumbar radiculopathy, chronic   Rationale for Evaluation and Treatment: Rehabilitation  THERAPY DIAG:  Lumbar radiculopathy, chronic - Plan: PT plan of care cert/re-cert  Muscle weakness (generalized) - Plan: PT plan of care cert/re-cert  ONSET DATE: 6-7 months  SUBJECTIVE:  SUBJECTIVE STATEMENT: My low back hurts all the time. Pt reports laying down at night is the worst, she is very active throughout the date. Acute on chronic low back pain exacerbation. Pt described pain is at bottom of her pelvic pain. It feels like my bones hurt.  PERTINENT HISTORY:  Neuropathy Narcolepsy   PAIN:  Are you having pain? Yes: NPRS scale: 7/10 Pain location: low back Pain description: Deep gnawing, excessive pain Aggravating factors: nothing Relieving factors: More activity reduced pain  PRECAUTIONS: None  RED FLAGS: None   WEIGHT BEARING RESTRICTIONS: No  FALLS:  Has patient fallen in last 6 months? Yes. Number of falls 4-5  PATIENT GOALS: get out pain  NEXT MD VISIT: Pending  OBJECTIVE:  Note: Objective  measures were completed at Evaluation unless otherwise noted.  DIAGNOSTIC FINDINGS:  Pending  PATIENT SURVEYS:  Modified Oswestry 30/50 = 60%   COGNITION: Overall cognitive status: Within functional limits for tasks assessed     SENSATION: WFL  POSTURE: decreased lumbar lordosis, posterior pelvic tilt, and flexed trunk   PALPATION: increased muscle tension paraspinals  LUMBAR ROM:   AROM eval  Flexion 100%*  Extension 75%*  Right lateral flexion 50%*  Left lateral flexion 25%*  Right rotation 60%*  Left rotation 60%*   (Blank rows = not tested)*painful*  LOWER EXTREMITY ROM:     Active  Right eval Left eval  Hip flexion    Hip extension    Hip abduction    Hip adduction    Hip internal rotation    Hip external rotation    Knee flexion    Knee extension    Ankle dorsiflexion    Ankle plantarflexion    Ankle inversion    Ankle eversion     (Blank rows = not tested)  LOWER EXTREMITY MMT:    MMT Right eval Left eval  Hip flexion 4- 3+  Hip extension 3+ 3+  Hip abduction 4- 3+  Hip adduction    Hip internal rotation    Hip external rotation    Knee flexion 4- 4-  Knee extension 4 4  Ankle dorsiflexion    Ankle plantarflexion    Ankle inversion    Ankle eversion     (Blank rows = not tested)  LUMBAR SPECIAL TESTS:  Straight leg raise test: Positive, Slump test: Positive, and Quadrant test: Positive  FUNCTIONAL TESTS:  30 Second Chair Stand Test: 18x  Norms:   Age 10-64 47-69 70-74 75-79 80-84 85-89 90-94  Women 15 15 14 13 12 11 9   Men 17 16 15 14 13 11 9    L SLS: 7.11 seconds  R SLS: 3.21 seconds  Norms: 18-39  F: 43.5 seconds  M: 43.2 seconds 40-49  F: 40.4 seconds  M: 40.1 seconds 50-59  F: 36 seconds  M: 38.1 seconds 60-69  F: 25.1 seconds  M: 28.7 seconds 70-79  F: 11.3 seconds  M: 18.3 seconds  GAIT: Distance walked: 41ft Assistive device utilized: None Level of assistance: Complete Independence Comments: trunk  flexion and no other major abnormalities.   TREATMENT DATE:   09/11/2023 PT Evaluation  LTR x 30 Supine piriformis stretch 1 x 1' SKTC x 15 Brides w/ GTB x 15 Clamshell with GTB x 15 bilaterally  PATIENT EDUCATION:  Education details: PT Evaluation, findings, prognosis, frequency, attendance policy, and HEP. Person educated: Patient Education method: Medical illustrator Education comprehension: verbalized understanding  HOME EXERCISE PROGRAM: Access Code: [email or web address] URL: [email or web address] Date: 09/11/2023 Prepared by: Irene Mannheim  Exercises - Supine Lower Trunk Rotation  - 1 x daily - 7 x weekly - 3 sets - 10 reps - Supine Figure 4 Piriformis Stretch  - 1 x daily - 7 x weekly - 3 sets - 10 reps - Hooklying Single Knee to Chest Stretch  - 1 x daily - 7 x weekly - 3 sets - 10 reps - Supine Bridge with Resistance Band  - 1 x daily - 7 x weekly - 3 sets - 10 reps - Clamshell with Resistance  - 1 x daily - 7 x weekly - 3 sets - 10 reps  ASSESSMENT:  CLINICAL IMPRESSION: Patient is a 63y.o. female who was seen today for physical therapy evaluation and treatment for M54.16 (ICD-10-CM) - Lumbar radiculopathy, chronic.  Patient demonstrating limited lumbar range of motion, bilateral hip muscle weakness, poor anatomical lumbar posture, which are limiting patient's capacity to perform functional mobility, lifting bending and pertinent ADLs with pain. Pt will benefit from skilled Physical Therapy services to address deficits/limitations in order to improve functional and QOL.    OBJECTIVE IMPAIRMENTS: Abnormal gait, decreased activity tolerance, decreased balance, decreased mobility, difficulty walking, decreased ROM, decreased strength, hypomobility, impaired flexibility, impaired sensation, improper body mechanics, postural dysfunction,  and pain.   ACTIVITY LIMITATIONS: carrying, lifting, bending, sitting, standing, squatting, sleeping, transfers, bed mobility, and locomotion level  PARTICIPATION LIMITATIONS: community activity, occupation, and yard work  PERSONAL FACTORS: Age are also affecting patient's functional outcome.   REHAB POTENTIAL: Good  CLINICAL DECISION MAKING: Stable/uncomplicated  EVALUATION COMPLEXITY: Low   GOALS: Goals reviewed with patient? No  SHORT TERM GOALS: Target date: 10/02/23  Pt will be independent with HEP in order to demonstrate participation in Physical Therapy POC.  Baseline: Goal status: INITIAL  2.  Pt will report 4/10 pain with mobility in order to demonstrate improved pain with ADLs.  Baseline:  Goal status: INITIAL  3.  Pt will improve Modified Oswestry score by at least 15% in order to demonstrate improved pain with functional goals and outcomes. Baseline: see objective.  Goal status: INITIAL  LONG TERM GOALS: Target date: 10/23/23  Pt will improve single-leg balance by at least 10 seconds in order to demonstrate improved safety with functional dynamic activities to return to desired activities.  Baseline: see objective.  Goal status: INITIAL  2.  Pt will improve BLE MMT by at least one half muscle grade in order to improve patient's activity tolerance during functional lifting and bending activities Baseline: see objective.  Goal status: INITIAL  3.  Pt will improve Modified Oswestry score by at least 30% in order to demonstrate improved pain with functional goals and outcomes. Baseline: see objective.  Goal status: INITIAL  4.  Pt will report 2/10 pain with mobility in order to demonstrate reduced pain with ADLs lasting greater than 30 minutes.  Baseline: see objective.  Goal status: INITIAL  PLAN:  PT FREQUENCY: 2x/week  PT DURATION: 6 weeks  PLANNED INTERVENTIONS: 97164- PT Re-evaluation, 97750- Physical Performance Testing, 97110-Therapeutic exercises,  97530- Therapeutic activity, [id number]- Neuromuscular re-education, 97535- Self Care, 16109- Manual therapy, 20560 (1-2 muscles), 20561 (3+ muscles)- Dry Needling, Balance training, Cryotherapy, and Moist heat.  PLAN FOR NEXT SESSION: decompressive activities, gluteal/ hip strengthening, cue patient for slow and  controlled movement, moves fast, SL balance.  Astrid Lay, DPT [facility] [phone] office  [facility] Medicare Auth Request Information  Date of referral: 06/19/23 Referring provider: Meldon Sport, MD Referring diagnosis (ICD 10)? M54.16 Treatment diagnosis (ICD 10)? (if different than referring diagnosis) M54.16  Functional Tool Score: 60% Modified Oswestry  What was this (referring dx) caused by? Ongoing Issue  Lonne Roan of Condition: Chronic (continuous duration > 3 months)   Laterality: Both  Current Functional Measure Score: Other Modified Oswestry  Objective measurements identify impairments when they are compared to normal values, the uninvolved extremity, and prior level of function.  [x]  Yes  []  No  Objective assessment of functional ability: Moderate functional limitations   Briefly describe symptoms: See above  How did symptoms start: See above  Average pain intensity:  Last 24 hours: 7/10  Past week: 6-7/10  How often does the pt experience symptoms? Occasionally  How much have the symptoms interfered with usual daily activities? Moderately  How has condition changed since care began at this facility? A little worse  In general, how is the patients overall health? Good   BACK PAIN (STarT Back Screening Tool) Has pain spread down the leg(s) at some time in the last 2 weeks? Yes Has there been pain in the shoulder or neck at some time in the last 2 weeks? No Has the pt only walked short distances because of back pain? No Has patient dressed more slowly because of back pain in the past 2 weeks? Yes Does  patient think it's not safe for a person with this condition to be physically active? No Does patient have worrying thoughts a lot of the time? No Does patient feel back pain is terrible and will never get any better? No Has patient stopped enjoying things they usually enjoy? No   Gatha Kaska, PT 09/11/2023, 2:10 PM

## 2023-09-19 ENCOUNTER — Ambulatory Visit (INDEPENDENT_AMBULATORY_CARE_PROVIDER_SITE_OTHER): Admitting: Internal Medicine

## 2023-09-19 ENCOUNTER — Encounter: Payer: Self-pay | Admitting: Internal Medicine

## 2023-09-19 VITALS — BP 118/75 | HR 93 | Ht 59.0 in | Wt 126.6 lb

## 2023-09-19 DIAGNOSIS — K219 Gastro-esophageal reflux disease without esophagitis: Secondary | ICD-10-CM | POA: Diagnosis not present

## 2023-09-19 DIAGNOSIS — Z0001 Encounter for general adult medical examination with abnormal findings: Secondary | ICD-10-CM

## 2023-09-19 DIAGNOSIS — I10 Essential (primary) hypertension: Secondary | ICD-10-CM | POA: Diagnosis not present

## 2023-09-19 DIAGNOSIS — J449 Chronic obstructive pulmonary disease, unspecified: Secondary | ICD-10-CM | POA: Diagnosis not present

## 2023-09-19 DIAGNOSIS — R739 Hyperglycemia, unspecified: Secondary | ICD-10-CM

## 2023-09-19 DIAGNOSIS — E782 Mixed hyperlipidemia: Secondary | ICD-10-CM

## 2023-09-19 DIAGNOSIS — E559 Vitamin D deficiency, unspecified: Secondary | ICD-10-CM

## 2023-09-19 DIAGNOSIS — M5416 Radiculopathy, lumbar region: Secondary | ICD-10-CM

## 2023-09-19 MED ORDER — MELOXICAM 7.5 MG PO TABS
7.5000 mg | ORAL_TABLET | Freq: Every day | ORAL | 2 refills | Status: AC
Start: 2023-09-19 — End: ?

## 2023-09-19 MED ORDER — METHOCARBAMOL 500 MG PO TABS: 500.0000 mg | ORAL_TABLET | Freq: Three times a day (TID) | ORAL | 0 refills | Status: AC | PRN

## 2023-09-19 MED ORDER — PREDNISONE 10 MG (21) PO TBPK
ORAL_TABLET | ORAL | 0 refills | Status: AC
Start: 2023-09-19 — End: ?

## 2023-09-19 NOTE — Assessment & Plan Note (Signed)
 Chronic, intermittent dyspnea, worse with exertion and cold, dry air Needs to quit smoking Albuterol  as needed for now Has Breztri  as maintenance inhaler, but needs to use it BID - did not  tolerate Trelegy Cheratussin as needed for cough

## 2023-09-19 NOTE — Assessment & Plan Note (Signed)
 BP Readings from Last 1 Encounters:  09/19/23 118/75   Well-controlled with telmisartan -HCTZ 80-25 mg QD and amlodipine  10 mg QD now Counseled for compliance with the medications Advised DASH diet and moderate exercise/walking, at least 150 mins/week

## 2023-09-19 NOTE — Patient Instructions (Addendum)
 Please start taking Prednisone  as prescribed.  Please take Methocarbamol  as needed for muscle spasms.  Please contact [facility] to schedule appointment for back pain.  [address], [place], [place] [address] Phone: [phone]  Please contact GI office to schedule appointment for swallowing difficulty and colonoscopy.

## 2023-09-19 NOTE — Assessment & Plan Note (Addendum)
 Has acute on chronic low back pain with radicular symptoms Started Sterapred Dosepak Meloxicam  as needed for pain, advised to take it after completing oral prednisone  Previous x-ray of lumbar spine reviewed - has history of DDD of lumbar spine Has tried gabapentin , but caused dizziness DC Flexeril  10 mg as it did not help much - trial of Robaxin  for now Had referred to PT, but could not continue due to financial constraints Referred to Spine surgery - may need MRI of lumbar spine

## 2023-09-19 NOTE — Assessment & Plan Note (Signed)
 Epigastric discomfort and sore throat could be due to GERD/gastritis On omeprazole  40 mg QD

## 2023-09-19 NOTE — Progress Notes (Signed)
 Established Patient Office Visit  Subjective:  Patient ID: [name], female    DOB: 1960/08/04  Age: 63 y.o. MRN: [id number]  CC:  Chief Complaint  Patient presents with   Hypertension    Three month follow up    Back Pain    Back pain, gabapentin  and flexeril  does not help   COPD    Three month follow up    HPI [name] is a 63 y.o. female with past medical history of HTN, COPD and tobacco abuse who presents for f/u of her chronic medical conditions.  Her BP was WNL today.  She takes telmisartan -hydrochlorothiazide  80-25 mg once daily and amlodipine  10 mg once daily now.  Her BP remains around 120s/70s at home as well now. Denies chest pain or palpitations currently.  She reports acute on chronic low back pain since 09/05/23, which is sharp, radiating to bilateral LE and is associated with numbness of the LE.  She has history of DDD of lumbar spine.  She also reports numbness of the hands and forearms, especially worse at night time.  She had dizziness with Gabapentin  300 mg and stopped taking it now.  She was given Flexeril  in the last visit, but she stopped taking it as it was not helpful.  She could not continue PT due to financial constraints.  COPD: She has noticed improvement in persistent cough with thick mucoid expectoration since starting Breztri .  She has been taking guaifenesin -codeine  syrup as needed for cough as well.  She has dyspnea and wheezing as well, but improves with albuterol  inhaler.  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 She was referred to ENT specialist, but did not schedule appt despite sending referral again in the last visit. She was referred to GI for dysphagia, but has not been able to see them yet as well.  She also reports multiple bumps over trunk area, especially over breast area and mid back area. Mammography showed multiple lipomas.    Past  Medical History:  Diagnosis Date   Anxiety    Arthritis    Cholecystitis 09/11/2017   Constipation    COPD (chronic obstructive pulmonary disease) (HCC)    Family history of adverse reaction to anesthesia    brother was allergic to anesthesia - heart stopped beating on the surgery table   Gallbladder sludge    GERD (gastroesophageal reflux disease)    Headache    used to have migraines   Hypertension    Palpitations    Proximal phalanx fracture of finger 03/31/2015   Staph infection     Past Surgical History:  Procedure Laterality Date   CHOLECYSTECTOMY N/A 09/13/2017   Procedure: LAPAROSCOPIC CHOLECYSTECTOMY;  Surgeon: Kallie Manuelita BROCKS, MD;  Location: [facility];  Service: General;  Laterality: N/A;   GROIN DEBRIDEMENT     due to spider bite,    OPEN REDUCTION INTERNAL FIXATION (ORIF) PROXIMAL PHALANX Right 04/07/2015   Procedure: OPEN REDUCTION INTERNAL FIXATION (ORIF) PROXIMAL PHALANX;  Surgeon: Glendia Cordella Hutchinson, MD;  Location: [facility];  Service: Orthopedics;  Laterality: Right;   TONSILLECTOMY     TUBAL LIGATION      Family History  Problem Relation Age of Onset   COPD Mother    Diabetes Father     Social History   Socioeconomic History   Marital status: Married    Spouse name: Not on file   Number of children:  Not on file   Years of education: Not on file   Highest education level: Not on file  Occupational History   Not on file  Tobacco Use   Smoking status: Some Days    Current packs/day: 0.50    Types: Cigarettes   Smokeless tobacco: Never  Vaping Use   Vaping status: Never Used  Substance and Sexual Activity   Alcohol use: Yes    Comment: wine occ   Drug use: No   Sexual activity: Not on file  Other Topics Concern   Not on file  Social History Narrative   Not on file   Social Drivers of Health   Financial Resource Strain: Not on file  Food Insecurity: Not on file  Transportation Needs: Not on file  Physical Activity: Not on file  Stress: Not  on file  Social Connections: Not on file  Intimate Partner Violence: Not on file    Outpatient Medications Prior to Visit  Medication Sig Dispense Refill   albuterol  (VENTOLIN  HFA) 108 (90 Base) MCG/ACT inhaler Inhale 2 puffs into the lungs every 6 (six) hours as needed for wheezing or shortness of breath. 18 g 5   amLODipine  (NORVASC ) 10 MG tablet Take 1 tablet (10 mg total) by mouth daily. 90 tablet 1   Budeson-Glycopyrrol-Formoterol (BREZTRI  AEROSPHERE) 160-9-4.8 MCG/ACT AERO Inhale 2 puffs into the lungs 2 (two) times daily. 10.7 g 11   fluticasone  (FLONASE ) 50 MCG/ACT nasal spray Place 2 sprays into both nostrils daily. 16 g 1   guaiFENesin -codeine  100-10 MG/5ML syrup Take 5 mLs by mouth 3 (three) times daily as needed for cough. 120 mL 0   magic mouthwash (lidocaine , diphenhydrAMINE , alum & mag hydroxide) suspension Swish and swallow 5 mLs 3 (three) times daily as needed for mouth pain. 360 mL 0   omeprazole  (PRILOSEC) 40 MG capsule Take 1 capsule (40 mg total) by mouth daily. 30 capsule 3   ondansetron  (ZOFRAN -ODT) 4 MG disintegrating tablet Take 1 tablet (4 mg total) by mouth every 8 (eight) hours as needed for nausea or vomiting. 20 tablet 0   telmisartan -hydrochlorothiazide  (MICARDIS  HCT) 80-25 MG tablet Take 1 tablet by mouth daily. 90 tablet 1   cyclobenzaprine  (FLEXERIL ) 10 MG tablet Take 1 tablet (10 mg total) by mouth at bedtime. 30 tablet 3   ibuprofen  (ADVIL ) 800 MG tablet Take 1 tablet (800 mg total) by mouth every 8 (eight) hours as needed. 30 tablet 0   No facility-administered medications prior to visit.    Allergies  Allergen Reactions   Penicillins Swelling    Throat swelling Has patient had a PCN reaction causing immediate rash, facial/tongue/throat swelling, SOB or lightheadedness with hypotension: Yes Has patient had a PCN reaction causing severe rash involving mucus membranes or skin necrosis: No Has patient had a PCN reaction that required hospitalization  ED  visit but no hospitalization Has patient had a PCN reaction occurring within the last 10 years: No If all of the above answers are NO, then may proceed with Cephalosporin use.    ROS Review of Systems  Constitutional:  Negative for chills and fever.  HENT:  Positive for congestion and sore throat.   Eyes:  Negative for pain and discharge.  Respiratory:  Positive for shortness of breath (Intermittent). Negative for cough.   Cardiovascular:  Negative for chest pain and palpitations.  Gastrointestinal:  Positive for constipation. Negative for abdominal pain, diarrhea, nausea and vomiting.  Endocrine: Negative for polydipsia and polyuria.  Genitourinary:  Negative for  dysuria and hematuria.  Musculoskeletal:  Positive for arthralgias, back pain and neck pain. Negative for neck stiffness.  Skin:  Negative for rash.       Skin bumps  Neurological:  Positive for numbness. Negative for dizziness and weakness.  Psychiatric/Behavioral:  Negative for agitation and behavioral problems.       Objective:    Physical Exam Vitals reviewed.  Constitutional:      General: She is not in acute distress.    Appearance: She is not diaphoretic.  HENT:     Head: Normocephalic and atraumatic.     Nose: Congestion present.     Mouth/Throat:     Mouth: Mucous membranes are moist.   Eyes:     General: No scleral icterus.    Extraocular Movements: Extraocular movements intact.    Cardiovascular:     Rate and Rhythm: Normal rate and regular rhythm.     Heart sounds: Normal heart sounds. No murmur heard. Pulmonary:     Breath sounds: Normal breath sounds. No wheezing or rales.  Abdominal:     Palpations: Abdomen is soft.     Tenderness: There is no abdominal tenderness.   Musculoskeletal:     Right hand: Tenderness present.     Left hand: Tenderness present.     Cervical back: Neck supple. No tenderness.     Lumbar back: Tenderness present. Decreased range of motion. Positive right straight  leg raise test and positive left straight leg raise test.     Right lower leg: No edema.     Left lower leg: No edema.     Comments: Heberden and Bouchard nodes bilaterally Trigger finger of left ring finger   Skin:    General: Skin is warm.     Findings: No rash.     Comments: Multiple lipomas over breast area and mid back area   Neurological:     General: No focal deficit present.     Mental Status: She is alert and oriented to person, place, and time.     Sensory: Sensory deficit (B/l hands) present.     Motor: No weakness.   Psychiatric:        Mood and Affect: Mood normal.        Behavior: Behavior normal.     BP 118/75   Pulse 93   Ht 4' 11 (1.499 m)   Wt 126 lb 9.6 oz (57.4 kg)   SpO2 91%   BMI 25.57 kg/m  Wt Readings from Last 3 Encounters:  09/19/23 126 lb 9.6 oz (57.4 kg)  06/19/23 134 lb 12.8 oz (61.1 kg)  04/19/23 145 lb 12.8 oz (66.1 kg)    Lab Results  Component Value Date   TSH 3.372 09/28/2018   Lab Results  Component Value Date   WBC 9.8 03/18/2023   HGB 17.0 (H) 03/18/2023   HCT 52.3 (H) 03/18/2023   MCV 93.1 03/18/2023   PLT 259 03/18/2023   Lab Results  Component Value Date   NA 136 03/18/2023   K 3.4 (L) 03/18/2023   CO2 27 03/18/2023   GLUCOSE 118 (H) 03/18/2023   BUN 11 03/18/2023   CREATININE 0.54 03/18/2023   BILITOT 0.6 03/05/2023   ALKPHOS 74 03/05/2023   AST 44 (H) 03/05/2023   ALT 36 03/05/2023   PROT 7.5 03/05/2023   ALBUMIN 3.5 03/05/2023   CALCIUM 9.6 03/18/2023   ANIONGAP 11 03/18/2023   EGFR 97 03/07/2022   Lab Results  Component Value Date  CHOL 151 02/02/2022   Lab Results  Component Value Date   HDL 54 02/02/2022   Lab Results  Component Value Date   LDLCALC 67 02/02/2022   Lab Results  Component Value Date   TRIG 178 (H) 02/02/2022   Lab Results  Component Value Date   CHOLHDL 2.8 02/02/2022   Lab Results  Component Value Date   HGBA1C 5.9 (H) 02/02/2022      Assessment & Plan:    Problem List Items Addressed This Visit       Cardiovascular and Mediastinum   Essential hypertension   BP Readings from Last 1 Encounters:  09/19/23 118/75   Well-controlled with telmisartan -HCTZ 80-25 mg QD and amlodipine  10 mg QD now Counseled for compliance with the medications Advised DASH diet and moderate exercise/walking, at least 150 mins/week      Relevant Orders   CBC with Differential/Platelet   CMP14+EGFR   TSH     Respiratory   Chronic obstructive pulmonary disease (HCC)   Chronic, intermittent dyspnea, worse with exertion and cold, dry air Needs to quit smoking Albuterol  as needed for now Has Breztri  as maintenance inhaler, but needs to use it BID - did not  tolerate Trelegy Cheratussin as needed for cough      Relevant Medications   predniSONE  (STERAPRED UNI-PAK 21 TAB) 10 MG (21) TBPK tablet     Digestive   GERD (gastroesophageal reflux disease)   Epigastric discomfort and sore throat could be due to GERD/gastritis On omeprazole  40 mg QD        Nervous and Auditory   Lumbar radiculopathy, chronic - Primary   Has acute on chronic low back pain with radicular symptoms Started Sterapred Dosepak Meloxicam  as needed for pain, advised to take it after completing oral prednisone  Previous x-ray of lumbar spine reviewed - has history of DDD of lumbar spine Has tried gabapentin , but caused dizziness DC Flexeril  10 mg as it did not help much - trial of Robaxin  for now Had referred to PT, but could not continue due to financial constraints Referred to Spine surgery - may need MRI of lumbar spine       Relevant Medications   predniSONE  (STERAPRED UNI-PAK 21 TAB) 10 MG (21) TBPK tablet   methocarbamol  (ROBAXIN ) 500 MG tablet   meloxicam  (MOBIC ) 7.5 MG tablet   Other Relevant Orders   Ambulatory referral to Spine Surgery     Other   Encounter for general adult medical examination with abnormal findings   Physical exam as documented. Fasting blood  tests today. Prefers to wait for Shingrix  vaccine.      Other Visit Diagnoses       Hyperglycemia       Relevant Orders   CMP14+EGFR   Hemoglobin A1c     Mixed hyperlipidemia       Relevant Orders   Lipid Profile     Vitamin D deficiency       Relevant Orders   Vitamin D (25 hydroxy)        Meds ordered this encounter  Medications   predniSONE  (STERAPRED UNI-PAK 21 TAB) 10 MG (21) TBPK tablet    Sig: Take as package instructions.    Dispense:  1 each    Refill:  0   methocarbamol  (ROBAXIN ) 500 MG tablet    Sig: Take 1 tablet (500 mg total) by mouth every 8 (eight) hours as needed for muscle spasms.    Dispense:  20 tablet    Refill:  0  meloxicam  (MOBIC ) 7.5 MG tablet    Sig: Take 1 tablet (7.5 mg total) by mouth daily.    Dispense:  30 tablet    Refill:  2    Follow-up: Return in about 4 months (around 01/19/2024) for HTN and COPD.    Suzzane MARLA Blanch, MD

## 2023-09-19 NOTE — Assessment & Plan Note (Signed)
 Physical exam as documented. Fasting blood tests today. Prefers to wait for Shingrix  vaccine.

## 2023-11-09 ENCOUNTER — Other Ambulatory Visit (HOSPITAL_COMMUNITY): Payer: Self-pay

## 2023-11-09 DIAGNOSIS — M47812 Spondylosis without myelopathy or radiculopathy, cervical region: Secondary | ICD-10-CM

## 2023-11-09 DIAGNOSIS — M47816 Spondylosis without myelopathy or radiculopathy, lumbar region: Secondary | ICD-10-CM

## 2023-11-11 ENCOUNTER — Ambulatory Visit (HOSPITAL_BASED_OUTPATIENT_CLINIC_OR_DEPARTMENT_OTHER)
Admission: RE | Admit: 2023-11-11 | Discharge: 2023-11-11 | Disposition: A | Discharge status: Home / Self Care | Source: Ambulatory Visit

## 2023-11-11 ENCOUNTER — Ambulatory Visit (HOSPITAL_COMMUNITY)
Admission: RE | Admit: 2023-11-11 | Discharge: 2023-11-11 | Disposition: A | Discharge status: Home / Self Care | Source: Ambulatory Visit

## 2023-11-11 DIAGNOSIS — M47816 Spondylosis without myelopathy or radiculopathy, lumbar region: Secondary | ICD-10-CM | POA: Diagnosis present

## 2023-11-11 DIAGNOSIS — R200 Anesthesia of skin: Secondary | ICD-10-CM | POA: Diagnosis not present

## 2023-11-11 DIAGNOSIS — M549 Dorsalgia, unspecified: Secondary | ICD-10-CM | POA: Diagnosis not present

## 2023-11-11 DIAGNOSIS — M47812 Spondylosis without myelopathy or radiculopathy, cervical region: Secondary | ICD-10-CM | POA: Insufficient documentation

## 2023-11-30 ENCOUNTER — Other Ambulatory Visit (HOSPITAL_COMMUNITY): Payer: Self-pay

## 2023-11-30 DIAGNOSIS — M47816 Spondylosis without myelopathy or radiculopathy, lumbar region: Secondary | ICD-10-CM

## 2023-12-02 ENCOUNTER — Emergency Department (HOSPITAL_COMMUNITY)

## 2023-12-02 ENCOUNTER — Encounter (HOSPITAL_COMMUNITY): Payer: Self-pay

## 2023-12-02 ENCOUNTER — Emergency Department (HOSPITAL_COMMUNITY)
Admission: EM | Admit: 2023-12-02 | Discharge: 2023-12-02 | Disposition: A | Discharge status: Home / Self Care | Attending: Emergency Medicine | Admitting: Emergency Medicine

## 2023-12-02 ENCOUNTER — Other Ambulatory Visit: Payer: Self-pay

## 2023-12-02 DIAGNOSIS — S0083XA Contusion of other part of head, initial encounter: Secondary | ICD-10-CM | POA: Insufficient documentation

## 2023-12-02 DIAGNOSIS — S0990XA Unspecified injury of head, initial encounter: Secondary | ICD-10-CM

## 2023-12-02 DIAGNOSIS — S0011XA Contusion of right eyelid and periocular area, initial encounter: Secondary | ICD-10-CM | POA: Insufficient documentation

## 2023-12-02 DIAGNOSIS — I10 Essential (primary) hypertension: Secondary | ICD-10-CM | POA: Diagnosis not present

## 2023-12-02 DIAGNOSIS — S301XXA Contusion of abdominal wall, initial encounter: Secondary | ICD-10-CM | POA: Diagnosis not present

## 2023-12-02 DIAGNOSIS — M542 Cervicalgia: Secondary | ICD-10-CM | POA: Diagnosis present

## 2023-12-02 DIAGNOSIS — Y92410 Unspecified street and highway as the place of occurrence of the external cause: Secondary | ICD-10-CM | POA: Diagnosis not present

## 2023-12-02 DIAGNOSIS — Z79899 Other long term (current) drug therapy: Secondary | ICD-10-CM | POA: Diagnosis not present

## 2023-12-02 LAB — BASIC METABOLIC PANEL WITH GFR
Anion gap: 14 (ref 5–15)
BUN: 13 mg/dL (ref 8–23)
CO2: 21 mmol/L — ABNORMAL LOW (ref 22–32)
Calcium: 9 mg/dL (ref 8.9–10.3)
Chloride: 101 mmol/L (ref 98–111)
Creatinine, Ser: 0.55 mg/dL (ref 0.44–1.00)
GFR, Estimated: >60 mL/min (ref >60)
Glucose, Bld: 104 mg/dL — ABNORMAL HIGH (ref 70–99)
Potassium: 3.5 mmol/L (ref 3.5–5.1)
Sodium: 136 mmol/L (ref 135–145)

## 2023-12-02 LAB — CBC
HCT: 46.2 % — ABNORMAL HIGH (ref 36.0–46.0)
Hemoglobin: 14.9 g/dL (ref 12.0–15.0)
MCH: 30.7 pg (ref 26.0–34.0)
MCHC: 32.3 g/dL (ref 30.0–36.0)
MCV: 95.3 fL (ref 80.0–100.0)
Platelets: 200 K/uL (ref 150–400)
RBC: 4.85 MIL/uL (ref 3.87–5.11)
RDW: 12.8 % (ref 11.5–15.5)
WBC: 8.2 K/uL (ref 4.0–10.5)
nRBC: 0 % (ref 0.0–0.2)

## 2023-12-02 MED ORDER — IOHEXOL 300 MG/ML  SOLN
100.0000 mL | Freq: Once | INTRAMUSCULAR | Status: AC | PRN
  Administered 2023-12-02: 100 mL via INTRAVENOUS
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
## 2023-12-05 ENCOUNTER — Other Ambulatory Visit (HOSPITAL_COMMUNITY)

## 2023-12-25 ENCOUNTER — Encounter (INDEPENDENT_AMBULATORY_CARE_PROVIDER_SITE_OTHER): Payer: Self-pay | Admitting: *Deleted

## 2024-01-03 ENCOUNTER — Telehealth: Payer: Self-pay | Admitting: Internal Medicine

## 2024-01-03 NOTE — Telephone Encounter (Signed)
 Medical clearance forms  Noted Copied Sleeved  Original placed provider box (appt 10.27.2025  Copy placed front desk folder

## 2024-01-10 ENCOUNTER — Encounter (INDEPENDENT_AMBULATORY_CARE_PROVIDER_SITE_OTHER): Payer: Self-pay | Admitting: Gastroenterology

## 2024-01-22 ENCOUNTER — Telehealth: Payer: Self-pay | Admitting: Internal Medicine

## 2024-01-22 ENCOUNTER — Other Ambulatory Visit: Payer: Self-pay

## 2024-01-22 ENCOUNTER — Ambulatory Visit: Admitting: Internal Medicine

## 2024-01-22 ENCOUNTER — Ambulatory Visit
Admission: EM | Admit: 2024-01-22 | Discharge: 2024-01-22 | Disposition: A | Discharge status: Home / Self Care | Attending: Family Medicine | Admitting: Family Medicine

## 2024-01-22 DIAGNOSIS — N390 Urinary tract infection, site not specified: Secondary | ICD-10-CM | POA: Insufficient documentation

## 2024-01-22 DIAGNOSIS — R030 Elevated blood-pressure reading, without diagnosis of hypertension: Secondary | ICD-10-CM | POA: Diagnosis not present

## 2024-01-22 DIAGNOSIS — I10 Essential (primary) hypertension: Secondary | ICD-10-CM

## 2024-01-22 LAB — POCT URINE DIPSTICK
Glucose, UA: NEGATIVE mg/dL
Nitrite, UA: NEGATIVE
POC PROTEIN,UA: >=300 — AB
Spec Grav, UA: 1.02 (ref 1.010–1.025)
Urobilinogen, UA: 1 U/dL
pH, UA: 7 (ref 5.0–8.0)

## 2024-01-22 MED ORDER — SULFAMETHOXAZOLE-TRIMETHOPRIM 800-160 MG PO TABS: 1.0000 | ORAL_TABLET | Freq: Two times a day (BID) | ORAL | 0 refills | Status: DC

## 2024-01-22 MED ORDER — SULFAMETHOXAZOLE-TRIMETHOPRIM 800-160 MG PO TABS: 1.0000 | ORAL_TABLET | Freq: Two times a day (BID) | ORAL | 0 refills | Status: AC

## 2024-01-22 MED ORDER — TELMISARTAN-HCTZ 80-25 MG PO TABS
1.0000 | ORAL_TABLET | Freq: Every day | ORAL | 1 refills | Status: AC
Start: 2024-01-22 — End: ?

## 2024-01-22 MED ORDER — PHENAZOPYRIDINE HCL 200 MG PO TABS: 200.0000 mg | ORAL_TABLET | Freq: Three times a day (TID) | ORAL | 0 refills | Status: DC | PRN

## 2024-01-22 MED ORDER — PHENAZOPYRIDINE HCL 200 MG PO TABS: 200.0000 mg | ORAL_TABLET | Freq: Three times a day (TID) | ORAL | 0 refills | Status: AC | PRN

## 2024-01-22 NOTE — ED Provider Notes (Signed)
 [facility]    CSN: [id number] Arrival date & time: 01/22/24  1816      History   Chief Complaint No chief complaint on file.   HPI [name] is a 62 y.o. female.   Patient presenting today with 4-day history of low back pain, dysuria, frequency, urgency.  Denies fever, vomiting, diarrhea, vaginal symptoms, concern for STIs.  So far not trying anything over-the-counter for symptoms.    Past Medical History:  Diagnosis Date   Anxiety    Arthritis    Cholecystitis 09/11/2017   Constipation    COPD (chronic obstructive pulmonary disease) (HCC)    Family history of adverse reaction to anesthesia    brother was allergic to anesthesia - heart stopped beating on the surgery table   Gallbladder sludge    GERD (gastroesophageal reflux disease)    Headache    used to have migraines   Hypertension    Palpitations    Proximal phalanx fracture of finger 03/31/2015   Staph infection     Patient Active Problem List   Diagnosis Date Noted   Lipoma 06/19/2023   Odynophagia 04/19/2023   Chronic sinusitis 04/19/2023   Leg swelling 04/19/2023   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   GERD (gastroesophageal reflux disease) 04/03/2023   Lumbar radiculopathy, chronic 03/02/2022   Bilateral sciatica 02/02/2022   Encounter for general adult medical examination with abnormal findings 02/02/2022   Carpal tunnel syndrome on both sides 02/02/2022   Idiopathic peripheral neuropathy 02/02/2022   Cervical radiculopathy 02/02/2022   Preop examination 09/30/2021   Primary osteoarthritis of both hands 08/19/2021   Chronic obstructive pulmonary disease (HCC) 08/19/2021   Trigger ring finger of left hand 08/19/2021   Chronic idiopathic constipation 08/19/2021   Lymphadenopathy 08/11/2021   Tobacco abuse 09/28/2018   Essential hypertension 09/28/2018    Past Surgical History:  Procedure Laterality Date   CHOLECYSTECTOMY N/A 09/13/2017   Procedure: LAPAROSCOPIC CHOLECYSTECTOMY;   Surgeon: Kallie Manuelita BROCKS, MD;  Location: [facility];  Service: General;  Laterality: N/A;   GROIN DEBRIDEMENT     due to spider bite,    OPEN REDUCTION INTERNAL FIXATION (ORIF) PROXIMAL PHALANX Right 04/07/2015   Procedure: OPEN REDUCTION INTERNAL FIXATION (ORIF) PROXIMAL PHALANX;  Surgeon: Glendia Cordella Hutchinson, MD;  Location: [facility];  Service: Orthopedics;  Laterality: Right;   TONSILLECTOMY     TUBAL LIGATION      OB History     Gravida      Para      Term      Preterm      AB      Living  3      SAB      IAB      Ectopic      Multiple      Live Births               Home Medications    Prior to Admission medications   Medication Sig Start Date End Date Taking? Authorizing Provider  albuterol  (VENTOLIN  HFA) 108 (90 Base) MCG/ACT inhaler Inhale 2 puffs into the lungs every 6 (six) hours as needed for wheezing or shortness of breath. 04/03/23   Tobie Suzzane POUR, MD  amLODipine  (NORVASC ) 10 MG tablet Take 1 tablet (10 mg total) by mouth daily. 06/19/23   Tobie Suzzane POUR, MD  Budeson-Glycopyrrol-Formoterol (BREZTRI  AEROSPHERE) 160-9-4.8 MCG/ACT AERO Inhale 2 puffs into the lungs 2 (two) times daily. 04/03/23   Tobie Suzzane POUR, MD  fluticasone  (FLONASE ) 50  MCG/ACT nasal spray Place 2 sprays into both nostrils daily. 04/19/23   Tobie Suzzane POUR, MD  guaiFENesin -codeine  100-10 MG/5ML syrup Take 5 mLs by mouth 3 (three) times daily as needed for cough. 04/19/23   Tobie Suzzane POUR, MD  magic mouthwash (lidocaine , diphenhydrAMINE , alum & mag hydroxide) suspension Swish and swallow 5 mLs 3 (three) times daily as needed for mouth pain. 04/19/23   Tobie Suzzane POUR, MD  meloxicam  (MOBIC ) 7.5 MG tablet Take 1 tablet (7.5 mg total) by mouth daily. 09/19/23   Tobie Suzzane POUR, MD  methocarbamol  (ROBAXIN ) 500 MG tablet Take 1 tablet (500 mg total) by mouth every 8 (eight) hours as needed for muscle spasms. 09/19/23   Tobie Suzzane POUR, MD  omeprazole  (PRILOSEC) 40 MG capsule Take 1 capsule (40 mg  total) by mouth daily. 04/03/23   Tobie Suzzane POUR, MD  ondansetron  (ZOFRAN -ODT) 4 MG disintegrating tablet Take 1 tablet (4 mg total) by mouth every 8 (eight) hours as needed for nausea or vomiting. 02/04/22   Chandra Harlene LABOR, NP  phenazopyridine (PYRIDIUM) 200 MG tablet Take 1 tablet (200 mg total) by mouth 3 (three) times daily as needed for pain. 01/22/24   Stuart Vernell Norris, PA-C  predniSONE  (STERAPRED UNI-PAK 21 TAB) 10 MG (21) TBPK tablet Take as package instructions. 09/19/23   Patel, Rutwik K, MD  sulfamethoxazole-trimethoprim (BACTRIM DS) 800-160 MG tablet Take 1 tablet by mouth 2 (two) times daily. 01/22/24   Stuart Vernell Norris, PA-C  telmisartan -hydrochlorothiazide  (MICARDIS  HCT) 80-25 MG tablet Take 1 tablet by mouth daily. 01/22/24   Tobie Suzzane POUR, MD    Family History Family History  Problem Relation Age of Onset   COPD Mother    Diabetes Father     Social History Social History   Tobacco Use   Smoking status: Some Days    Current packs/day: 0.50    Types: Cigarettes   Smokeless tobacco: Never  Vaping Use   Vaping status: Never Used  Substance Use Topics   Alcohol use: Yes    Comment: wine occ   Drug use: No     Allergies   Penicillins   Review of Systems Review of Systems Per HPI  Physical Exam Triage Vital Signs ED Triage Vitals  Encounter Vitals Group     BP 01/22/24 1820 (!) 161/100     Girls Systolic BP Percentile --      Girls Diastolic BP Percentile --      Boys Systolic BP Percentile --      Boys Diastolic BP Percentile --      Pulse Rate 01/22/24 1820 98     Resp 01/22/24 1820 20     Temp 01/22/24 1820 98.4 F (36.9 C)     Temp Source 01/22/24 1820 Oral     SpO2 01/22/24 1820 94 %     Weight --      Height --      Head Circumference --      Peak Flow --      Pain Score 01/22/24 1822 8     Pain Loc --      Pain Education --      Exclude from Growth Chart --    No data found.  Updated Vital Signs BP (!) 161/100 (BP  Location: Right Arm)   Pulse 98   Temp 98.4 F (36.9 C) (Oral)   Resp 20   SpO2 94%   Visual Acuity Right Eye Distance:   Left Eye Distance:  Bilateral Distance:    Right Eye Near:   Left Eye Near:    Bilateral Near:     Physical Exam Vitals and nursing note reviewed.  Constitutional:      Appearance: Normal appearance. She is not ill-appearing.  HENT:     Head: Atraumatic.  Eyes:     Extraocular Movements: Extraocular movements intact.     Conjunctiva/sclera: Conjunctivae normal.  Cardiovascular:     Rate and Rhythm: Normal rate.  Pulmonary:     Effort: Pulmonary effort is normal.  Abdominal:     General: Bowel sounds are normal. There is no distension.     Palpations: Abdomen is soft.     Tenderness: There is no abdominal tenderness. There is no right CVA tenderness, left CVA tenderness or guarding.  Musculoskeletal:        General: Normal range of motion.     Cervical back: Normal range of motion and neck supple.  Skin:    General: Skin is warm and dry.  Neurological:     Mental Status: She is alert and oriented to person, place, and time.  Psychiatric:        Mood and Affect: Mood normal.        Thought Content: Thought content normal.        Judgment: Judgment normal.      UC Treatments / Results  Labs (all labs ordered are listed, but only abnormal results are displayed) Labs Reviewed  POCT URINE DIPSTICK - Abnormal; Notable for the following components:      Result Value   Clarity, UA cloudy (*)    Bilirubin, UA small (*)    Ketones, POC UA trace (5) (*)    Blood, UA large (*)    POC PROTEIN,UA >=300 (*)    Leukocytes, UA Large (3+) (*)    All other components within normal limits  URINE CULTURE    EKG   Radiology No results found.  Procedures Procedures (including critical care time)  Medications Ordered in UC Medications - No data to display  Initial Impression / Assessment and Plan / UC Course  I have reviewed the triage vital  signs and the nursing notes.  Pertinent labs & imaging results that were available during my care of the patient were reviewed by me and considered in my medical decision making (see chart for details).     Urinalysis consistent with urinary tract infection, suspect the discomfort to be elevating her blood pressure today but monitor home blood pressure readings and follow-up if not resolving back to within normal limits.  Treat with Bactrim, Prodium, fluids, supportive over-the-counter medications at home care.  Return for worsening or unresolving symptoms.  Final Clinical Impressions(s) / UC Diagnoses   Final diagnoses:  Acute lower UTI  Elevated blood pressure reading   Discharge Instructions   None    ED Prescriptions     Medication Sig Dispense Auth. Provider   sulfamethoxazole-trimethoprim (BACTRIM DS) 800-160 MG tablet  (Status: Discontinued) Take 1 tablet by mouth 2 (two) times daily. 10 tablet Stuart Vernell Norris, PA-C   phenazopyridine (PYRIDIUM) 200 MG tablet  (Status: Discontinued) Take 1 tablet (200 mg total) by mouth 3 (three) times daily as needed for pain. 6 tablet Pose[place]on Pam Elizabeth, PA-C   sulfamethoxazole-trimethoprim (BACTRIM DS) 800-160 MG tablet  (Status: Discontinued) Take 1 tablet by mouth 2 (two) times daily. 10 tablet Stuart Vernell Norris, PA-C   phenazopyridine (PYRIDIUM) 200 MG tablet  (Status: Discontinued) Take 1 tablet (200  mg total) by mouth 3 (three) times daily as needed for pain. 6 tablet Stuart Vernell Norris, PA-C   phenazopyridine (PYRIDIUM) 200 MG tablet Take 1 tablet (200 mg total) by mouth 3 (three) times daily as needed for pain. 6 tablet Lindyn Vossler Elizabeth, PA-C   sulfamethoxazole-trimethoprim (BACTRIM DS) 800-160 MG tablet Take 1 tablet by mouth 2 (two) times daily. 10 tablet Stuart Vernell Norris, [place]      PDMP not reviewed this encounter.   Stuart Vernell Norris, [place] 01/22/24 1846

## 2024-01-22 NOTE — ED Triage Notes (Signed)
 Pt reports lower back pain, x 4 days, unable to urinate this morning. Stabbing pressure, urinary frequency and urgency.

## 2024-01-22 NOTE — Telephone Encounter (Signed)
 REFILL SENT

## 2024-01-22 NOTE — Telephone Encounter (Signed)
 Prescription Request  01/22/2024  LOV: 09/19/2023  What is the name of the medication or equipment? telmisartan -hydrochlorothiazide  (MICARDIS  HCT) 80-25 MG tablet   Have you contacted your pharmacy to request a refill? Yes   Which pharmacy would you like this sent to?  [facility] [address] [place] - [address] [place] [address] Phone: [phone] Fax: [phone]    Patient notified that their request is being sent to the clinical staff for review and that they should receive a response within 2 business days.   Please advise at walked into office

## 2024-01-24 ENCOUNTER — Other Ambulatory Visit: Payer: Self-pay

## 2024-01-25 ENCOUNTER — Other Ambulatory Visit: Payer: Self-pay | Admitting: Internal Medicine

## 2024-01-25 ENCOUNTER — Ambulatory Visit (HOSPITAL_COMMUNITY): Payer: Self-pay

## 2024-01-25 DIAGNOSIS — I10 Essential (primary) hypertension: Secondary | ICD-10-CM

## 2024-01-25 LAB — URINE CULTURE: Culture: 60000 — AB

## 2024-01-30 ENCOUNTER — Ambulatory Visit: Admitting: Internal Medicine

## 2024-02-12 NOTE — H&P (View-Only) (Signed)
 [facility] [place]  ASSESSMENT / PLAN: [name] is a 63 y.o. female with plans to undergo L5/S1 anterior lumbar interbody fusion on 02/29/24 with Dr. Darnella. I have reviewed the patient's imaging and feel it is safe to approach this anteriorly. I discussed the specific risks associated with spine exposure including vascular injury, urethral injury, bowel injury, nervous injury. The patient was understanding and wished to proceed.   CHIEF COMPLAINT: Back pain with radiculopathy  HISTORY OF PRESENT ILLNESS: [name] is a 63 y.o. female referred to clinic to discuss anterior exposure of the L5-S1 disc space for back pain with radiculopathy.  She has been recommended to undergo this therapy by Dr. Darnella.  I have reviewed her scans with Dr. Darnella and feel this is safe to do from an anterior exposure.  I explained to the patient the typical operative conduct of the repair.  She was understanding and wanted to proceed.  Past Medical History:  Diagnosis Date   Anxiety    Arthritis    Cholecystitis 09/11/2017   Constipation    COPD (chronic obstructive pulmonary disease) (HCC)    Family history of adverse reaction to anesthesia    brother was allergic to anesthesia - heart stopped beating on the surgery table   Gallbladder sludge    GERD (gastroesophageal reflux disease)    Headache    used to have migraines   Hypertension    Palpitations    Proximal phalanx fracture of finger 03/31/2015   Staph infection     Past Surgical History:  Procedure Laterality Date   CHOLECYSTECTOMY N/A 09/13/2017   Procedure: LAPAROSCOPIC CHOLECYSTECTOMY;  Surgeon: Kallie Manuelita BROCKS, MD;  Location: [facility];  Service: General;  Laterality: N/A;   GROIN DEBRIDEMENT     due to spider bite,    OPEN REDUCTION INTERNAL FIXATION (ORIF) PROXIMAL PHALANX Right 04/07/2015   Procedure: OPEN REDUCTION INTERNAL FIXATION (ORIF) PROXIMAL PHALANX;  Surgeon: Glendia Cordella Hutchinson, MD;  Location: [facility];  Service: Orthopedics;  Laterality: Right;   TONSILLECTOMY     TUBAL LIGATION      Family History  Problem Relation Age of Onset   COPD Mother    Diabetes Father     Social History   Socioeconomic History   Marital status: Married    Spouse name: Not on file   Number of children: Not on file   Years of education: Not on file   Highest education level: Not on file  Occupational History   Not on file  Tobacco Use   Smoking status: Former    Current packs/day: 0.00    Types: Cigarettes    Quit date: 2025    Years since quitting: 0.8   Smokeless tobacco: Never  Vaping Use   Vaping status: Never Used  Substance and Sexual Activity   Alcohol use: Yes    Comment: wine occ   Drug use: No   Sexual activity: Not on file  Other Topics Concern   Not on file  Social History Narrative   Not on file   Social Drivers of Health   Financial Resource Strain: Not on file  Food Insecurity: Not on file  Transportation Needs: Not on file  Physical Activity: Not on file  Stress: Not on file  Social Connections: Not on file  Intimate Partner Violence: Not on file    Allergies  Allergen Reactions   Penicillins Swelling    Throat swelling Has patient had a PCN reaction causing  immediate rash, facial/tongue/throat swelling, SOB or lightheadedness with hypotension: Yes Has patient had a PCN reaction causing severe rash involving mucus membranes or skin necrosis: No Has patient had a PCN reaction that required hospitalization  ED visit but no hospitalization Has patient had a PCN reaction occurring within the last 10 years: No If all of the above answers are NO, then may proceed with Cephalosporin use.    Current Outpatient Medications  Medication Sig Dispense Refill   albuterol  (VENTOLIN  HFA) 108 (90 Base) MCG/ACT inhaler Inhale 2 puffs into the lungs every 6 (six) hours as needed for wheezing or shortness of breath. 18 g 5   amLODipine  (NORVASC ) 10 MG tablet Take 1 tablet  by mouth once daily 90 tablet 0   Budeson-Glycopyrrol-Formoterol (BREZTRI  AEROSPHERE) 160-9-4.8 MCG/ACT AERO Inhale 2 puffs into the lungs 2 (two) times daily. 10.7 g 11   fluticasone  (FLONASE ) 50 MCG/ACT nasal spray Place 2 sprays into both nostrils daily. 16 g 1   guaiFENesin -codeine  100-10 MG/5ML syrup Take 5 mLs by mouth 3 (three) times daily as needed for cough. 120 mL 0   magic mouthwash (lidocaine , diphenhydrAMINE , alum & mag hydroxide) suspension Swish and swallow 5 mLs 3 (three) times daily as needed for mouth pain. 360 mL 0   meloxicam  (MOBIC ) 7.5 MG tablet Take 1 tablet (7.5 mg total) by mouth daily. 30 tablet 2   methocarbamol  (ROBAXIN ) 500 MG tablet Take 1 tablet (500 mg total) by mouth every 8 (eight) hours as needed for muscle spasms. 20 tablet 0   omeprazole  (PRILOSEC) 40 MG capsule Take 1 capsule (40 mg total) by mouth daily. 30 capsule 3   ondansetron  (ZOFRAN -ODT) 4 MG disintegrating tablet Take 1 tablet (4 mg total) by mouth every 8 (eight) hours as needed for nausea or vomiting. 20 tablet 0   phenazopyridine (PYRIDIUM) 200 MG tablet Take 1 tablet (200 mg total) by mouth 3 (three) times daily as needed for pain. 6 tablet 0   predniSONE  (STERAPRED UNI-PAK 21 TAB) 10 MG (21) TBPK tablet Take as package instructions. 1 each 0   sulfamethoxazole-trimethoprim (BACTRIM DS) 800-160 MG tablet Take 1 tablet by mouth 2 (two) times daily. 10 tablet 0   telmisartan -hydrochlorothiazide  (MICARDIS  HCT) 80-25 MG tablet Take 1 tablet by mouth daily. 90 tablet 1   No current facility-administered medications for this visit.    PHYSICAL EXAM Vitals:   02/13/24 1013  BP: (!) 143/80  Pulse: 90  Weight: 124 lb (56.2 kg)  Height: 4' 11 (1.499 m)   No acute distress Regular rate and rhythm Unlabored breathing Soft, nontender abdomen  PERTINENT LABORATORY AND RADIOLOGIC DATA  Most recent CBC    Latest Ref Rng & Units 12/02/2023    3:50 PM 03/18/2023    8:29 PM 03/05/2023   11:59 PM  CBC   WBC 4.0 - 10.5 K/uL 8.2  9.8  9.7   Hemoglobin 12.0 - 15.0 g/dL 85.0  82.9  84.7   Hematocrit 36.0 - 46.0 % 46.2  52.3  46.2   Platelets 150 - 400 K/uL 200  259  220      Most recent CMP    Latest Ref Rng & Units 12/02/2023    3:50 PM 03/18/2023    8:29 PM 03/05/2023   11:59 PM  CMP  Glucose 70 - 99 mg/dL 895  881  873   BUN 8 - 23 mg/dL 13  11  10    Creatinine 0.44 - 1.00 mg/dL 9.44  9.45  9.49  Sodium 135 - 145 mmol/L 136  136  138   Potassium 3.5 - 5.1 mmol/L 3.5  3.4  3.5   Chloride 98 - 111 mmol/L 101  98  102   CO2 22 - 32 mmol/L 21  27  28    Calcium 8.9 - 10.3 mg/dL 9.0  9.6  8.9   Total Protein 6.5 - 8.1 g/dL   7.5   Total Bilirubin <1.2 mg/dL   0.6   Alkaline Phos 38 - 126 U/L   74   AST 15 - 41 U/L   44   ALT 0 - 44 U/L   36     Renal function CrCl cannot be calculated (Patient's most recent lab result is older than the maximum 21 days allowed.).  Hgb A1c MFr Bld (%)  Date Value  02/02/2022 5.9 (H)    LDL Chol Calc (NIH)  Date Value Ref Range Status  02/02/2022 67 0 - 99 mg/dL Final    Debby SAILOR. Magda, MD FACS [facility] Phone Number: [phone] 02/13/2024 11:37 AM   Total time spent on preparing this encounter including chart review, data review, collecting history, examining the patient, and coordinating care: 60 min  Portions of this report may have been transcribed using voice recognition software.  Every effort has been made to ensure accuracy; however, inadvertent computerized transcription errors may still be present.

## 2024-02-12 NOTE — Progress Notes (Unsigned)
 [facility] [place]  ASSESSMENT / PLAN: [name] is a 63 y.o. female with plans to undergo L5/S1 anterior lumbar interbody fusion on 02/29/24 with Dr. Darnella. I have reviewed the patient's imaging and feel it is safe to approach this anteriorly. I discussed the specific risks associated with spine exposure including vascular injury, urethral injury, bowel injury, nervous injury. The patient was understanding and wished to proceed.   CHIEF COMPLAINT: Back pain with radiculopathy  HISTORY OF PRESENT ILLNESS: [name] is a 63 y.o. female referred to clinic to discuss anterior exposure of the L5-S1 disc space for back pain with radiculopathy.  She has been recommended to undergo this therapy by Dr. Darnella.  I have reviewed her scans with Dr. Darnella and feel this is safe to do from an anterior exposure.  I explained to the patient the typical operative conduct of the repair.  She was understanding and wanted to proceed.  Past Medical History:  Diagnosis Date   Anxiety    Arthritis    Cholecystitis 09/11/2017   Constipation    COPD (chronic obstructive pulmonary disease) (HCC)    Family history of adverse reaction to anesthesia    brother was allergic to anesthesia - heart stopped beating on the surgery table   Gallbladder sludge    GERD (gastroesophageal reflux disease)    Headache    used to have migraines   Hypertension    Palpitations    Proximal phalanx fracture of finger 03/31/2015   Staph infection     Past Surgical History:  Procedure Laterality Date   CHOLECYSTECTOMY N/A 09/13/2017   Procedure: LAPAROSCOPIC CHOLECYSTECTOMY;  Surgeon: Kallie Manuelita BROCKS, MD;  Location: [facility];  Service: General;  Laterality: N/A;   GROIN DEBRIDEMENT     due to spider bite,    OPEN REDUCTION INTERNAL FIXATION (ORIF) PROXIMAL PHALANX Right 04/07/2015   Procedure: OPEN REDUCTION INTERNAL FIXATION (ORIF) PROXIMAL PHALANX;  Surgeon: Glendia Cordella Hutchinson, MD;  Location: [facility];  Service: Orthopedics;  Laterality: Right;   TONSILLECTOMY     TUBAL LIGATION      Family History  Problem Relation Age of Onset   COPD Mother    Diabetes Father     Social History   Socioeconomic History   Marital status: Married    Spouse name: Not on file   Number of children: Not on file   Years of education: Not on file   Highest education level: Not on file  Occupational History   Not on file  Tobacco Use   Smoking status: Former    Current packs/day: 0.00    Types: Cigarettes    Quit date: 2025    Years since quitting: 0.8   Smokeless tobacco: Never  Vaping Use   Vaping status: Never Used  Substance and Sexual Activity   Alcohol use: Yes    Comment: wine occ   Drug use: No   Sexual activity: Not on file  Other Topics Concern   Not on file  Social History Narrative   Not on file   Social Drivers of Health   Financial Resource Strain: Not on file  Food Insecurity: Not on file  Transportation Needs: Not on file  Physical Activity: Not on file  Stress: Not on file  Social Connections: Not on file  Intimate Partner Violence: Not on file    Allergies  Allergen Reactions   Penicillins Swelling    Throat swelling Has patient had a PCN reaction causing  immediate rash, facial/tongue/throat swelling, SOB or lightheadedness with hypotension: Yes Has patient had a PCN reaction causing severe rash involving mucus membranes or skin necrosis: No Has patient had a PCN reaction that required hospitalization  ED visit but no hospitalization Has patient had a PCN reaction occurring within the last 10 years: No If all of the above answers are NO, then may proceed with Cephalosporin use.    Current Outpatient Medications  Medication Sig Dispense Refill   albuterol  (VENTOLIN  HFA) 108 (90 Base) MCG/ACT inhaler Inhale 2 puffs into the lungs every 6 (six) hours as needed for wheezing or shortness of breath. 18 g 5   amLODipine  (NORVASC ) 10 MG tablet Take 1 tablet  by mouth once daily 90 tablet 0   Budeson-Glycopyrrol-Formoterol (BREZTRI  AEROSPHERE) 160-9-4.8 MCG/ACT AERO Inhale 2 puffs into the lungs 2 (two) times daily. 10.7 g 11   fluticasone  (FLONASE ) 50 MCG/ACT nasal spray Place 2 sprays into both nostrils daily. 16 g 1   guaiFENesin -codeine  100-10 MG/5ML syrup Take 5 mLs by mouth 3 (three) times daily as needed for cough. 120 mL 0   magic mouthwash (lidocaine , diphenhydrAMINE , alum & mag hydroxide) suspension Swish and swallow 5 mLs 3 (three) times daily as needed for mouth pain. 360 mL 0   meloxicam  (MOBIC ) 7.5 MG tablet Take 1 tablet (7.5 mg total) by mouth daily. 30 tablet 2   methocarbamol  (ROBAXIN ) 500 MG tablet Take 1 tablet (500 mg total) by mouth every 8 (eight) hours as needed for muscle spasms. 20 tablet 0   omeprazole  (PRILOSEC) 40 MG capsule Take 1 capsule (40 mg total) by mouth daily. 30 capsule 3   ondansetron  (ZOFRAN -ODT) 4 MG disintegrating tablet Take 1 tablet (4 mg total) by mouth every 8 (eight) hours as needed for nausea or vomiting. 20 tablet 0   phenazopyridine (PYRIDIUM) 200 MG tablet Take 1 tablet (200 mg total) by mouth 3 (three) times daily as needed for pain. 6 tablet 0   predniSONE  (STERAPRED UNI-PAK 21 TAB) 10 MG (21) TBPK tablet Take as package instructions. 1 each 0   sulfamethoxazole-trimethoprim (BACTRIM DS) 800-160 MG tablet Take 1 tablet by mouth 2 (two) times daily. 10 tablet 0   telmisartan -hydrochlorothiazide  (MICARDIS  HCT) 80-25 MG tablet Take 1 tablet by mouth daily. 90 tablet 1   No current facility-administered medications for this visit.    PHYSICAL EXAM Vitals:   02/13/24 1013  BP: (!) 143/80  Pulse: 90  Weight: 124 lb (56.2 kg)  Height: 4' 11 (1.499 m)   No acute distress Regular rate and rhythm Unlabored breathing Soft, nontender abdomen  PERTINENT LABORATORY AND RADIOLOGIC DATA  Most recent CBC    Latest Ref Rng & Units 12/02/2023    3:50 PM 03/18/2023    8:29 PM 03/05/2023   11:59 PM  CBC   WBC 4.0 - 10.5 K/uL 8.2  9.8  9.7   Hemoglobin 12.0 - 15.0 g/dL 85.0  82.9  84.7   Hematocrit 36.0 - 46.0 % 46.2  52.3  46.2   Platelets 150 - 400 K/uL 200  259  220      Most recent CMP    Latest Ref Rng & Units 12/02/2023    3:50 PM 03/18/2023    8:29 PM 03/05/2023   11:59 PM  CMP  Glucose 70 - 99 mg/dL 895  881  873   BUN 8 - 23 mg/dL 13  11  10    Creatinine 0.44 - 1.00 mg/dL 9.44  9.45  9.49  Sodium 135 - 145 mmol/L 136  136  138   Potassium 3.5 - 5.1 mmol/L 3.5  3.4  3.5   Chloride 98 - 111 mmol/L 101  98  102   CO2 22 - 32 mmol/L 21  27  28    Calcium 8.9 - 10.3 mg/dL 9.0  9.6  8.9   Total Protein 6.5 - 8.1 g/dL   7.5   Total Bilirubin <1.2 mg/dL   0.6   Alkaline Phos 38 - 126 U/L   74   AST 15 - 41 U/L   44   ALT 0 - 44 U/L   36     Renal function CrCl cannot be calculated (Patient's most recent lab result is older than the maximum 21 days allowed.).  Hgb A1c MFr Bld (%)  Date Value  02/02/2022 5.9 (H)    LDL Chol Calc (NIH)  Date Value Ref Range Status  02/02/2022 67 0 - 99 mg/dL Final    Debby SAILOR. Magda, MD FACS [facility] Phone Number: [phone] 02/13/2024 11:37 AM   Total time spent on preparing this encounter including chart review, data review, collecting history, examining the patient, and coordinating care: 60 min  Portions of this report may have been transcribed using voice recognition software.  Every effort has been made to ensure accuracy; however, inadvertent computerized transcription errors may still be present.

## 2024-02-13 ENCOUNTER — Other Ambulatory Visit: Payer: Self-pay

## 2024-02-13 ENCOUNTER — Encounter: Payer: Self-pay | Admitting: Vascular Surgery

## 2024-02-13 ENCOUNTER — Ambulatory Visit: Admitting: Vascular Surgery

## 2024-02-13 VITALS — BP 143/80 | HR 90 | Ht 59.0 in | Wt 124.0 lb

## 2024-02-13 DIAGNOSIS — M5410 Radiculopathy, site unspecified: Secondary | ICD-10-CM

## 2024-02-19 ENCOUNTER — Other Ambulatory Visit: Payer: Self-pay | Admitting: *Deleted

## 2024-02-19 ENCOUNTER — Encounter (HOSPITAL_COMMUNITY): Payer: Self-pay

## 2024-02-19 DIAGNOSIS — M79606 Pain in leg, unspecified: Secondary | ICD-10-CM

## 2024-02-19 DIAGNOSIS — R0989 Other specified symptoms and signs involving the circulatory and respiratory systems: Secondary | ICD-10-CM

## 2024-02-19 NOTE — Progress Notes (Signed)
 Surgical Instructions   Your procedure is scheduled on Tuesday, December 4th. Report to [facility] Main Entrance A at 5:30 A.M., then check in with the Admitting office. Any questions or running late day of surgery: call [phone]  Questions prior to your surgery date: call [phone], Monday-Friday, 8am-4pm. If you experience any cold or flu symptoms such as cough, fever, chills, shortness of breath, etc. between now and your scheduled surgery, please notify us  at the above number.     Remember:  Do not eat or drink after midnight the night before your surgery. This includes no gum, mints, or hard candy.   Take these medicines the morning of surgery with A SIP OF WATER  amLODipine  (NORVASC )  omeprazole  (PRILOSEC)    May take these medicines IF NEEDED: albuterol  (VENTOLIN  HFA) inhaler - bring with you on day of surgery    One week prior to surgery, STOP taking any Aspirin  (unless otherwise instructed by your surgeon) Aleve , Naproxen , Ibuprofen , Motrin , Advil , Goody's, BC's, all herbal medications, fish oil, and non-prescription vitamins.                     Do NOT Smoke (Tobacco/Vaping) for 24 hours prior to your procedure.  If you use a CPAP at night, you may bring your mask/headgear for your overnight stay.   You will be asked to remove any contacts, glasses, piercing's, hearing aid's, dentures/partials prior to surgery. Please bring cases for these items if needed.    Patients discharged the day of surgery will not be allowed to drive home, and someone needs to stay with them for 24 hours.  SURGICAL WAITING ROOM VISITATION Patients may have no more than 2 support people in the waiting area - these visitors may rotate.   Pre-op nurse will coordinate an appropriate time for 1 ADULT support person, who may not rotate, to accompany patient in pre-op.  Children under the age of 38 must have an adult with them who is not the patient and must remain in the main waiting area  with an adult.  If the patient needs to stay at the hospital during part of their recovery, the visitor guidelines for inpatient rooms apply.  Please refer to the [facility] website for the visitor guidelines for any additional information.   If you received a COVID test during your pre-op visit  it is requested that you wear a mask when out in public, stay away from anyone that may not be feeling well and notify your surgeon if you develop symptoms. If you have been in contact with anyone that has tested positive in the last 10 days please notify you surgeon.      Pre-operative 4 CHG Bathing Instructions   You can play a key role in reducing the risk of infection after surgery. Your skin needs to be as free of germs as possible. You can reduce the number of germs on your skin by washing with CHG (chlorhexidine  gluconate) soap before surgery. CHG is an antiseptic soap that kills germs and continues to kill germs even after washing.   DO NOT use if you have an allergy to chlorhexidine /CHG or antibacterial soaps. If your skin becomes reddened or irritated, stop using the CHG and notify one of our RNs at [phone].   Please shower with the CHG soap starting 4 days before surgery using the following schedule:     Please keep in mind the following:  DO NOT shave, including legs and underarms, starting  the day of your first shower.   You may shave your face at any point before/day of surgery.  Place clean sheets on your bed the day you start using CHG soap. Use a clean washcloth (not used since being washed) for each shower. DO NOT sleep with pets once you start using the CHG.   CHG Shower Instructions:  Wash your face and private area with normal soap. If you choose to wash your hair, wash first with your normal shampoo.  After you use shampoo/soap, rinse your hair and body thoroughly to remove shampoo/soap residue.  Turn the water OFF and apply  bottle of CHG soap to a CLEAN  washcloth.  Apply CHG soap ONLY FROM YOUR NECK DOWN TO YOUR TOES (washing for 3-5 minutes)  DO NOT use CHG soap on face, private areas, open wounds, or sores.  Pay special attention to the area where your surgery is being performed.  If you are having back surgery, having someone wash your back for you may be helpful. Wait 2 minutes after CHG soap is applied, then you may rinse off the CHG soap.  Pat dry with a clean towel  Put on clean clothes/pajamas   If you choose to wear lotion, please use ONLY the CHG-compatible lotions that are listed below.  Additional instructions for the day of surgery:  If you choose, you may shower the morning of surgery with an antibacterial soap.  DO NOT APPLY any lotions, deodorants, or perfumes.   Do not bring valuables to the hospital. [facility] is not responsible for any belongings/valuables. Do not wear nail polish, gel polish, artificial nails, or any other type of covering on natural nails (fingers and toes) Do not wear jewelry or makeup Put on clean/comfortable clothes.  Please brush your teeth.  Ask your nurse before applying any prescription medications to the skin.     CHG Compatible Lotions   Aveeno Moisturizing lotion  Cetaphil Moisturizing Cream  Cetaphil Moisturizing Lotion  Clairol Herbal Essence Moisturizing Lotion, Dry Skin  Clairol Herbal Essence Moisturizing Lotion, Extra Dry Skin  Clairol Herbal Essence Moisturizing Lotion, Normal Skin  Curel Age Defying Therapeutic Moisturizing Lotion with Alpha Hydroxy  Curel Extreme Care Body Lotion  Curel Soothing Hands Moisturizing Hand Lotion  Curel Therapeutic Moisturizing Cream, Fragrance-Free  Curel Therapeutic Moisturizing Lotion, Fragrance-Free  Curel Therapeutic Moisturizing Lotion, Original Formula  Eucerin Daily Replenishing Lotion  Eucerin Dry Skin Therapy Plus Alpha Hydroxy Crme  Eucerin Dry Skin Therapy Plus Alpha Hydroxy Lotion  Eucerin Original Crme  Eucerin Original  Lotion  Eucerin Plus Crme Eucerin Plus Lotion  Eucerin TriLipid Replenishing Lotion  Keri Anti-Bacterial Hand Lotion  Keri Deep Conditioning Original Lotion Dry Skin Formula Softly Scented  Keri Deep Conditioning Original Lotion, Fragrance Free Sensitive Skin Formula  Keri Lotion Fast Absorbing Fragrance Free Sensitive Skin Formula  Keri Lotion Fast Absorbing Softly Scented Dry Skin Formula  Keri Original Lotion  Keri Skin Renewal Lotion Keri Silky Smooth Lotion  Keri Silky Smooth Sensitive Skin Lotion  Nivea Body Creamy Conditioning Oil  Nivea Body Extra Enriched Lotion  Nivea Body Original Lotion  Nivea Body Sheer Moisturizing Lotion Nivea Crme  Nivea Skin Firming Lotion  NutraDerm 30 Skin Lotion  NutraDerm Skin Lotion  NutraDerm Therapeutic Skin Cream  NutraDerm Therapeutic Skin Lotion  ProShield Protective Hand Cream  Provon moisturizing lotion  Please read over the following fact sheets that you were given.

## 2024-02-19 NOTE — Progress Notes (Incomplete)
 PCP - Dr Suzzane Blanch Cardiologist - none  CT Chest x-ray - 12/02/23 EKG - 03/18/23 Stress Test - n/a ECHO - n/a Cardiac Cath - n/a  ICD Pacemaker/Loop - n/a  Sleep Study -  n/a  Diabetes - n/a  Aspirin  & Blood Thinner Instructions:  n/a  NPO  Anesthesia review: Yes  STOP now taking any Aspirin  (unless otherwise instructed by your surgeon), Aleve , Naproxen , Ibuprofen , Motrin , Advil , Goody's, BC's, all herbal medications, fish oil, and all vitamins.   Coronavirus Screening Do you have any of the following symptoms:  Cough yes/no: No Fever (>100.67F)  yes/no: No Runny nose yes/no: No Sore throat yes/no: No Difficulty breathing/shortness of breath  yes/no: No  Have you traveled in the last 14 days and where? yes/no: No  Patient verbalized understanding of instructions that were given to them at the PAT appointment. Patient was also instructed that they will need to review over the PAT instructions again at home before surgery.

## 2024-02-20 ENCOUNTER — Encounter (HOSPITAL_COMMUNITY)
Admission: RE | Admit: 2024-02-20 | Discharge: 2024-02-20 | Disposition: A | Discharge status: Home / Self Care | Source: Ambulatory Visit

## 2024-02-20 ENCOUNTER — Encounter (HOSPITAL_COMMUNITY): Payer: Self-pay

## 2024-02-20 ENCOUNTER — Other Ambulatory Visit: Payer: Self-pay

## 2024-02-20 DIAGNOSIS — Z01818 Encounter for other preprocedural examination: Secondary | ICD-10-CM | POA: Insufficient documentation

## 2024-02-20 HISTORY — DX: Pneumonia, unspecified organism: J18.9

## 2024-02-20 LAB — CBC
HCT: 47.5 % — ABNORMAL HIGH (ref 36.0–46.0)
Hemoglobin: 15.7 g/dL — ABNORMAL HIGH (ref 12.0–15.0)
MCH: 30.6 pg (ref 26.0–34.0)
MCHC: 33.1 g/dL (ref 30.0–36.0)
MCV: 92.6 fL (ref 80.0–100.0)
Platelets: 278 K/uL (ref 150–400)
RBC: 5.13 MIL/uL — ABNORMAL HIGH (ref 3.87–5.11)
RDW: 12.9 % (ref 11.5–15.5)
WBC: 9 K/uL (ref 4.0–10.5)
nRBC: 0 % (ref 0.0–0.2)

## 2024-02-20 LAB — BASIC METABOLIC PANEL WITH GFR
Anion gap: 10 (ref 5–15)
BUN: 12 mg/dL (ref 8–23)
CO2: 25 mmol/L (ref 22–32)
Calcium: 9.3 mg/dL (ref 8.9–10.3)
Chloride: 98 mmol/L (ref 98–111)
Creatinine, Ser: 0.66 mg/dL (ref 0.44–1.00)
GFR, Estimated: >60 mL/min (ref >60)
Glucose, Bld: 118 mg/dL — ABNORMAL HIGH (ref 70–99)
Potassium: 3.3 mmol/L — ABNORMAL LOW (ref 3.5–5.1)
Sodium: 133 mmol/L — ABNORMAL LOW (ref 135–145)

## 2024-02-20 LAB — TYPE AND SCREEN
ABO/RH(D): A POS
Antibody Screen: NEGATIVE

## 2024-02-20 LAB — SURGICAL PCR SCREEN
MRSA, PCR: NEGATIVE
Staphylococcus aureus: NEGATIVE

## 2024-02-20 NOTE — Progress Notes (Signed)
 PCP - Dr Suzzane Blanch  Cardiologist - none  CT Chest x-ray - 12/02/23 EKG - 03/20/23 Stress Test - [address] [place] [facility] ECHO - n/a Cardiac Cath - n/a  ICD Pacemaker/Loop - n/a  Sleep Study -  n/a  Diabetes - n/a   Aspirin  & Blood Thinner Instructions:  n/a NPO  Anesthesia review: Yes  STOP now taking any Aspirin  (unless otherwise instructed by your surgeon), Aleve , Naproxen , Ibuprofen , Motrin , Advil , Goody's, BC's, all herbal medications, fish oil, and all vitamins.   Coronavirus Screening Do you have any of the following symptoms:  Cough yes/no: No Fever (>100.30F)  yes/no: No Runny nose yes/no: No Sore throat yes/no: No Difficulty breathing/shortness of breath  yes/no: No  Have you traveled in the last 14 days and where? yes/no: No  Patient verbalized understanding of instructions that were given to them at the PAT appointment. Patient was also instructed that they will need to review over the PAT instructions again at home before surgery.

## 2024-02-27 ENCOUNTER — Ambulatory Visit: Admitting: Internal Medicine

## 2024-02-29 ENCOUNTER — Other Ambulatory Visit: Payer: Self-pay

## 2024-02-29 ENCOUNTER — Observation Stay (HOSPITAL_COMMUNITY): Admission: RE | Admit: 2024-02-29 | Discharge: 2024-03-01 | DRG: 402 | Disposition: A

## 2024-02-29 ENCOUNTER — Inpatient Hospital Stay (HOSPITAL_COMMUNITY)

## 2024-02-29 ENCOUNTER — Encounter (HOSPITAL_COMMUNITY): Admission: RE | Disposition: A | Payer: Self-pay | Source: Home / Self Care

## 2024-02-29 ENCOUNTER — Inpatient Hospital Stay (HOSPITAL_COMMUNITY): Admitting: Physician Assistant

## 2024-02-29 ENCOUNTER — Inpatient Hospital Stay (HOSPITAL_COMMUNITY): Admitting: Anesthesiology

## 2024-02-29 ENCOUNTER — Encounter (HOSPITAL_COMMUNITY): Payer: Self-pay

## 2024-02-29 DIAGNOSIS — Y838 Other surgical procedures as the cause of abnormal reaction of the patient, or of later complication, without mention of misadventure at the time of the procedure: Secondary | ICD-10-CM | POA: Diagnosis not present

## 2024-02-29 DIAGNOSIS — Z01818 Encounter for other preprocedural examination: Principal | ICD-10-CM

## 2024-02-29 DIAGNOSIS — M419 Scoliosis, unspecified: Secondary | ICD-10-CM | POA: Diagnosis present

## 2024-02-29 DIAGNOSIS — Z79899 Other long term (current) drug therapy: Secondary | ICD-10-CM

## 2024-02-29 DIAGNOSIS — S35515A Injury of left iliac vein, initial encounter: Secondary | ICD-10-CM | POA: Diagnosis not present

## 2024-02-29 DIAGNOSIS — Z9049 Acquired absence of other specified parts of digestive tract: Secondary | ICD-10-CM

## 2024-02-29 DIAGNOSIS — Z87891 Personal history of nicotine dependence: Secondary | ICD-10-CM

## 2024-02-29 DIAGNOSIS — J449 Chronic obstructive pulmonary disease, unspecified: Secondary | ICD-10-CM | POA: Diagnosis present

## 2024-02-29 DIAGNOSIS — K219 Gastro-esophageal reflux disease without esophagitis: Secondary | ICD-10-CM | POA: Diagnosis present

## 2024-02-29 DIAGNOSIS — M19041 Primary osteoarthritis, right hand: Secondary | ICD-10-CM | POA: Diagnosis present

## 2024-02-29 DIAGNOSIS — Y92234 Operating room of hospital as the place of occurrence of the external cause: Secondary | ICD-10-CM | POA: Diagnosis not present

## 2024-02-29 DIAGNOSIS — I10 Essential (primary) hypertension: Secondary | ICD-10-CM | POA: Diagnosis present

## 2024-02-29 DIAGNOSIS — M19042 Primary osteoarthritis, left hand: Secondary | ICD-10-CM | POA: Diagnosis present

## 2024-02-29 DIAGNOSIS — Z791 Long term (current) use of non-steroidal anti-inflammatories (NSAID): Secondary | ICD-10-CM

## 2024-02-29 DIAGNOSIS — M4857XA Collapsed vertebra, not elsewhere classified, lumbosacral region, initial encounter for fracture: Secondary | ICD-10-CM | POA: Diagnosis present

## 2024-02-29 DIAGNOSIS — Z419 Encounter for procedure for purposes other than remedying health state, unspecified: Secondary | ICD-10-CM

## 2024-02-29 DIAGNOSIS — M5117 Intervertebral disc disorders with radiculopathy, lumbosacral region: Principal | ICD-10-CM | POA: Diagnosis present

## 2024-02-29 DIAGNOSIS — Z9851 Tubal ligation status: Secondary | ICD-10-CM

## 2024-02-29 HISTORY — PX: LUMBAR PERCUTANEOUS PEDICLE SCREW 1 LEVEL: SHX5560

## 2024-02-29 HISTORY — PX: ABDOMINAL EXPOSURE: SHX5708

## 2024-02-29 HISTORY — PX: ANTERIOR LUMBAR FUSION: SHX1170

## 2024-02-29 LAB — ABO/RH: ABO/RH(D): A POS

## 2024-02-29 SURGERY — ANTERIOR LUMBAR FUSION 1 LEVEL
Anesthesia: General

## 2024-02-29 MED ORDER — PROPOFOL 500 MG/50ML IV EMUL
INTRAVENOUS | Status: DC | PRN
  Administered 2024-02-29: 50 ug/kg/min via INTRAVENOUS

## 2024-02-29 MED ORDER — POLYETHYLENE GLYCOL 3350 17 G PO PACK
17.0000 g | PACK | Freq: Every day | ORAL | Status: DC
  Administered 2024-03-01: 17 g via ORAL
  Filled 2024-02-29: qty 1

## 2024-02-29 MED ORDER — MAGNESIUM CITRATE PO SOLN: 1.0000 | Freq: Once | ORAL | Status: DC | PRN

## 2024-02-29 MED ORDER — CHLORHEXIDINE GLUCONATE CLOTH 2 % EX PADS: 6.0000 | MEDICATED_PAD | Freq: Once | CUTANEOUS | Status: DC

## 2024-02-29 MED ORDER — THROMBIN 20000 UNITS EX SOLR
CUTANEOUS | Status: AC
  Filled 2024-02-29: qty 20000

## 2024-02-29 MED ORDER — ACETAMINOPHEN 500 MG PO TABS
ORAL_TABLET | ORAL | Status: AC
  Administered 2024-02-29: 1000 mg via ORAL
  Filled 2024-02-29: qty 2

## 2024-02-29 MED ORDER — PROPOFOL 10 MG/ML IV BOLUS
INTRAVENOUS | Status: AC
  Filled 2024-02-29: qty 20

## 2024-02-29 MED ORDER — DEXAMETHASONE SOD PHOSPHATE PF 10 MG/ML IJ SOLN
INTRAMUSCULAR | Status: DC | PRN
  Administered 2024-02-29: 5 mg via INTRAVENOUS

## 2024-02-29 MED ORDER — FENTANYL CITRATE (PF) 100 MCG/2ML IJ SOLN
INTRAMUSCULAR | Status: AC
  Filled 2024-02-29: qty 2

## 2024-02-29 MED ORDER — SODIUM CHLORIDE 0.9 % IV SOLN: 250.0000 mL | INTRAVENOUS | Status: DC

## 2024-02-29 MED ORDER — SUFENTANIL CITRATE 250 MCG/5ML IV SOLN: 0.2000 ug/kg | Freq: Once | INTRAVENOUS | Status: DC

## 2024-02-29 MED ORDER — ENOXAPARIN SODIUM 40 MG/0.4ML IJ SOSY
40.0000 mg | PREFILLED_SYRINGE | INTRAMUSCULAR | Status: DC
  Administered 2024-03-01: 40 mg via SUBCUTANEOUS
  Filled 2024-02-29: qty 0.4

## 2024-02-29 MED ORDER — SUFENTANIL CITRATE 250 MCG/5ML IV SOLN
0.2500 ug/kg/h | INTRAVENOUS | Status: AC
  Administered 2024-02-29: .25 ug/kg/h via INTRAVENOUS
  Filled 2024-02-29: qty 5

## 2024-02-29 MED ORDER — OXYCODONE HCL 5 MG/5ML PO SOLN: 5.0000 mg | Freq: Once | ORAL | Status: DC | PRN

## 2024-02-29 MED ORDER — CEFAZOLIN SODIUM-DEXTROSE 1-4 GM/50ML-% IV SOLN
INTRAVENOUS | Status: DC | PRN
  Administered 2024-02-29: 2 g via INTRAVENOUS

## 2024-02-29 MED ORDER — SUGAMMADEX SODIUM 200 MG/2ML IV SOLN
INTRAVENOUS | Status: DC | PRN
  Administered 2024-02-29: 200 mg via INTRAVENOUS

## 2024-02-29 MED ORDER — ACETAMINOPHEN 650 MG RE SUPP: 650.0000 mg | RECTAL | Status: DC | PRN

## 2024-02-29 MED ORDER — DOCUSATE SODIUM 100 MG PO CAPS
100.0000 mg | ORAL_CAPSULE | Freq: Two times a day (BID) | ORAL | Status: DC
  Administered 2024-02-29 – 2024-03-01 (×3): 100 mg via ORAL
  Filled 2024-02-29 (×2): qty 1

## 2024-02-29 MED ORDER — FENTANYL CITRATE (PF) 250 MCG/5ML IJ SOLN
INTRAMUSCULAR | Status: DC | PRN
  Administered 2024-02-29 (×2): 50 ug via INTRAVENOUS

## 2024-02-29 MED ORDER — PHENYLEPHRINE HCL-NACL 20-0.9 MG/250ML-% IV SOLN
INTRAVENOUS | Status: DC | PRN
  Administered 2024-02-29: 35 ug/min via INTRAVENOUS

## 2024-02-29 MED ORDER — ONDANSETRON HCL 4 MG/2ML IJ SOLN
INTRAMUSCULAR | Status: AC
  Filled 2024-02-29: qty 2

## 2024-02-29 MED ORDER — EPHEDRINE SULFATE-NACL 50-0.9 MG/10ML-% IV SOSY
PREFILLED_SYRINGE | INTRAVENOUS | Status: DC | PRN
  Administered 2024-02-29 (×2): 10 mg via INTRAVENOUS
  Administered 2024-02-29 (×2): 5 mg via INTRAVENOUS

## 2024-02-29 MED ORDER — ALBUTEROL SULFATE (2.5 MG/3ML) 0.083% IN NEBU: 2.5000 mg | INHALATION_SOLUTION | Freq: Four times a day (QID) | RESPIRATORY_TRACT | Status: DC | PRN

## 2024-02-29 MED ORDER — LIDOCAINE-EPINEPHRINE 1 %-1:100000 IJ SOLN
INTRAMUSCULAR | Status: DC | PRN
  Administered 2024-02-29: 20 mL

## 2024-02-29 MED ORDER — PANTOPRAZOLE SODIUM 40 MG PO TBEC
40.0000 mg | DELAYED_RELEASE_TABLET | Freq: Every day | ORAL | Status: DC
  Administered 2024-03-01: 40 mg via ORAL
  Filled 2024-02-29: qty 1

## 2024-02-29 MED ORDER — 0.9 % SODIUM CHLORIDE (POUR BTL) OPTIME
TOPICAL | Status: DC | PRN
  Administered 2024-02-29: 1000 mL

## 2024-02-29 MED ORDER — PROPOFOL 10 MG/ML IV BOLUS
INTRAVENOUS | Status: DC | PRN
  Administered 2024-02-29: 100 mg via INTRAVENOUS

## 2024-02-29 MED ORDER — TELMISARTAN-HCTZ 80-25 MG PO TABS: 1.0000 | ORAL_TABLET | Freq: Every day | ORAL | Status: DC

## 2024-02-29 MED ORDER — MIDAZOLAM HCL (PF) 2 MG/2ML IJ SOLN
INTRAMUSCULAR | Status: DC | PRN
  Administered 2024-02-29: 2 mg via INTRAVENOUS

## 2024-02-29 MED ORDER — CEFAZOLIN SODIUM-DEXTROSE 2-4 GM/100ML-% IV SOLN
2.0000 g | Freq: Four times a day (QID) | INTRAVENOUS | Status: AC
  Administered 2024-02-29 (×2): 2 g via INTRAVENOUS
  Filled 2024-02-29 (×2): qty 100

## 2024-02-29 MED ORDER — HYDROCHLOROTHIAZIDE 25 MG PO TABS
25.0000 mg | ORAL_TABLET | Freq: Every day | ORAL | Status: DC
  Administered 2024-03-01: 25 mg via ORAL
  Filled 2024-02-29: qty 1

## 2024-02-29 MED ORDER — CHLORHEXIDINE GLUCONATE 0.12 % MT SOLN
15.0000 mL | Freq: Once | OROMUCOSAL | Status: AC
  Administered 2024-02-29: 15 mL via OROMUCOSAL

## 2024-02-29 MED ORDER — IRBESARTAN 150 MG PO TABS
300.0000 mg | ORAL_TABLET | Freq: Every day | ORAL | Status: DC
  Administered 2024-03-01: 300 mg via ORAL
  Filled 2024-02-29: qty 2

## 2024-02-29 MED ORDER — MENTHOL 3 MG MT LOZG: 1.0000 | LOZENGE | OROMUCOSAL | Status: DC | PRN

## 2024-02-29 MED ORDER — PHENOL 1.4 % MT LIQD: 1.0000 | OROMUCOSAL | Status: DC | PRN

## 2024-02-29 MED ORDER — SODIUM CHLORIDE 0.9% FLUSH
3.0000 mL | Freq: Two times a day (BID) | INTRAVENOUS | Status: DC
  Administered 2024-02-29: 3 mL via INTRAVENOUS

## 2024-02-29 MED ORDER — KETAMINE HCL 50 MG/5ML IJ SOSY
PREFILLED_SYRINGE | INTRAMUSCULAR | Status: AC
  Filled 2024-02-29: qty 5

## 2024-02-29 MED ORDER — PHENYLEPHRINE 80 MCG/ML (10ML) SYRINGE FOR IV PUSH (FOR BLOOD PRESSURE SUPPORT)
PREFILLED_SYRINGE | INTRAVENOUS | Status: DC | PRN
  Administered 2024-02-29: 40 ug via INTRAVENOUS
  Administered 2024-02-29 (×3): 120 ug via INTRAVENOUS

## 2024-02-29 MED ORDER — FENTANYL CITRATE (PF) 100 MCG/2ML IJ SOLN
25.0000 ug | INTRAMUSCULAR | Status: DC | PRN
  Administered 2024-02-29: 50 ug via INTRAVENOUS

## 2024-02-29 MED ORDER — ONDANSETRON HCL 4 MG/2ML IJ SOLN
INTRAMUSCULAR | Status: DC | PRN
  Administered 2024-02-29: 4 mg via INTRAVENOUS

## 2024-02-29 MED ORDER — MORPHINE SULFATE (PF) 2 MG/ML IV SOLN
2.0000 mg | INTRAVENOUS | Status: DC | PRN
  Administered 2024-02-29: 2 mg via INTRAVENOUS
  Filled 2024-02-29: qty 1

## 2024-02-29 MED ORDER — ROCURONIUM BROMIDE 10 MG/ML (PF) SYRINGE
PREFILLED_SYRINGE | INTRAVENOUS | Status: DC | PRN
  Administered 2024-02-29: 20 mg via INTRAVENOUS
  Administered 2024-02-29: 60 mg via INTRAVENOUS
  Administered 2024-02-29: 20 mg via INTRAVENOUS
  Administered 2024-02-29: 40 mg via INTRAVENOUS
  Administered 2024-02-29: 20 mg via INTRAVENOUS

## 2024-02-29 MED ORDER — CEFAZOLIN SODIUM-DEXTROSE 2-4 GM/100ML-% IV SOLN
INTRAVENOUS | Status: AC
  Filled 2024-02-29: qty 100

## 2024-02-29 MED ORDER — CYCLOBENZAPRINE HCL 10 MG PO TABS
10.0000 mg | ORAL_TABLET | Freq: Three times a day (TID) | ORAL | Status: DC
  Administered 2024-02-29 – 2024-03-01 (×3): 10 mg via ORAL
  Filled 2024-02-29 (×3): qty 1

## 2024-02-29 MED ORDER — THROMBIN 5000 UNITS EX KIT
PACK | CUTANEOUS | Status: AC
  Filled 2024-02-29: qty 1

## 2024-02-29 MED ORDER — AMLODIPINE BESYLATE 10 MG PO TABS
10.0000 mg | ORAL_TABLET | Freq: Every day | ORAL | Status: DC
  Administered 2024-03-01: 10 mg via ORAL
  Filled 2024-02-29: qty 1

## 2024-02-29 MED ORDER — LACTATED RINGERS IV SOLN: INTRAVENOUS | Status: DC | PRN

## 2024-02-29 MED ORDER — DROPERIDOL 2.5 MG/ML IJ SOLN: 0.6250 mg | Freq: Once | INTRAMUSCULAR | Status: DC | PRN

## 2024-02-29 MED ORDER — ONDANSETRON HCL 4 MG/2ML IJ SOLN: 4.0000 mg | Freq: Four times a day (QID) | INTRAMUSCULAR | Status: DC | PRN

## 2024-02-29 MED ORDER — OXYCODONE HCL 5 MG PO TABS
5.0000 mg | ORAL_TABLET | ORAL | Status: DC | PRN
  Administered 2024-02-29 – 2024-03-01 (×4): 5 mg via ORAL
  Filled 2024-02-29 (×4): qty 1

## 2024-02-29 MED ORDER — SODIUM CHLORIDE 0.9 % IV SOLN: INTRAVENOUS | Status: DC

## 2024-02-29 MED ORDER — ONDANSETRON HCL 4 MG PO TABS: 4.0000 mg | ORAL_TABLET | Freq: Four times a day (QID) | ORAL | Status: DC | PRN

## 2024-02-29 MED ORDER — VANCOMYCIN HCL IN DEXTROSE 1-5 GM/200ML-% IV SOLN
INTRAVENOUS | Status: DC
  Filled 2024-02-29: qty 200

## 2024-02-29 MED ORDER — LACTATED RINGERS IV SOLN: INTRAVENOUS | Status: DC

## 2024-02-29 MED ORDER — LIDOCAINE 2% (20 MG/ML) 5 ML SYRINGE
INTRAMUSCULAR | Status: DC | PRN
  Administered 2024-02-29: 60 mg via INTRAVENOUS

## 2024-02-29 MED ORDER — LIDOCAINE 2% (20 MG/ML) 5 ML SYRINGE
INTRAMUSCULAR | Status: AC
  Filled 2024-02-29: qty 5

## 2024-02-29 MED ORDER — SODIUM CHLORIDE 0.9% FLUSH: 3.0000 mL | INTRAVENOUS | Status: DC | PRN

## 2024-02-29 MED ORDER — OXYCODONE HCL 5 MG PO TABS: 5.0000 mg | ORAL_TABLET | Freq: Once | ORAL | Status: DC | PRN

## 2024-02-29 MED ORDER — BUDESON-GLYCOPYRROL-FORMOTEROL 160-9-4.8 MCG/ACT IN AERO
2.0000 | INHALATION_SPRAY | Freq: Two times a day (BID) | RESPIRATORY_TRACT | Status: DC
  Administered 2024-02-29 – 2024-03-01 (×2): 2 via RESPIRATORY_TRACT
  Filled 2024-02-29: qty 5.9

## 2024-02-29 MED ORDER — ROCURONIUM BROMIDE 10 MG/ML (PF) SYRINGE
PREFILLED_SYRINGE | INTRAVENOUS | Status: AC
  Filled 2024-02-29: qty 10

## 2024-02-29 MED ORDER — ACETAMINOPHEN 500 MG PO TABS: 1000.0000 mg | ORAL_TABLET | Freq: Once | ORAL | Status: AC

## 2024-02-29 MED ORDER — THROMBIN 5000 UNITS EX SOLR
OROMUCOSAL | Status: DC | PRN
  Administered 2024-02-29: 5 mL via TOPICAL

## 2024-02-29 MED ORDER — ACETAMINOPHEN 325 MG PO TABS: 650.0000 mg | ORAL_TABLET | ORAL | Status: DC | PRN

## 2024-02-29 MED ORDER — MIDAZOLAM HCL 2 MG/2ML IJ SOLN
INTRAMUSCULAR | Status: AC
  Filled 2024-02-29: qty 2

## 2024-02-29 MED ORDER — BISACODYL 5 MG PO TBEC: 5.0000 mg | DELAYED_RELEASE_TABLET | Freq: Every day | ORAL | Status: DC | PRN

## 2024-02-29 MED ORDER — ORAL CARE MOUTH RINSE: 15.0000 mL | Freq: Once | OROMUCOSAL | Status: AC

## 2024-02-29 MED ORDER — LIDOCAINE-EPINEPHRINE 1 %-1:100000 IJ SOLN
INTRAMUSCULAR | Status: AC
  Filled 2024-02-29: qty 2

## 2024-02-29 MED ORDER — ALBUMIN HUMAN 5 % IV SOLN: INTRAVENOUS | Status: DC | PRN

## 2024-02-29 MED ORDER — VANCOMYCIN HCL IN DEXTROSE 1-5 GM/200ML-% IV SOLN: 1000.0000 mg | INTRAVENOUS | Status: DC

## 2024-02-29 SURGICAL SUPPLY — 92 items
ALLOGRAFT ARTHROCELL PLUS 2.5 (Bone Implant) IMPLANT
ALLOGRAFT ARTHROCELL PLUS 5 (Bone Implant) IMPLANT
ANCHOR LUMBAR 25 MIS (Anchor) IMPLANT
BAG COUNTER SPONGE SURGICOUNT (BAG) ×2 IMPLANT
BASKET BONE COLLECTION (BASKET) IMPLANT
BENZOIN TINCTURE PRP APPL 2/3 (GAUZE/BANDAGES/DRESSINGS) IMPLANT
BLADE CLIPPER SURG (BLADE) IMPLANT
BUR 14 MATCH 3 (BUR) IMPLANT
BUR CARBIDE MATCH 3.0 (BURR) IMPLANT
BUR MR8 14 BALL 5 (BUR) IMPLANT
BUR PRECISION FLUTE 5.0 (BURR) IMPLANT
CANISTER SUCTION 3000ML PPV (SUCTIONS) ×1 IMPLANT
CAP LOCKING SPINE (Cap) IMPLANT
CLIP APPLIE 11 MED OPEN (CLIP) ×2 IMPLANT
CLIP LIGATING EXTRA MED SLVR (CLIP) ×1 IMPLANT
COVER BACK TABLE 60X90IN (DRAPES) ×1 IMPLANT
DERMABOND ADVANCED .7 DNX12 (GAUZE/BANDAGES/DRESSINGS) ×1 IMPLANT
DRAPE C-ARM 42X72 X-RAY (DRAPES) ×2 IMPLANT
DRAPE C-ARMOR (DRAPES) ×1 IMPLANT
DRAPE LAPAROTOMY 100X72X124 (DRAPES) ×1 IMPLANT
DRAPE SHEET LG 3/4 BI-LAMINATE (DRAPES) ×4 IMPLANT
DRAPE SURG 17X23 STRL (DRAPES) ×1 IMPLANT
DRAPE UTILITY XL STRL (DRAPES) ×1 IMPLANT
DRSG OPSITE POSTOP 4X6 (GAUZE/BANDAGES/DRESSINGS) IMPLANT
DURAPREP 26ML APPLICATOR (WOUND CARE) ×4 IMPLANT
ELECTRODE BLADE INSULATED 4IN (ELECTROSURGICAL) ×1 IMPLANT
ELECTRODE BLDE 4.0 EZ CLN MEGD (MISCELLANEOUS) ×1 IMPLANT
ELECTRODE REM PT RTRN 9FT ADLT (ELECTROSURGICAL) ×1 IMPLANT
FEE COVERAGE SUPPORT O-ARM (MISCELLANEOUS) ×1 IMPLANT
GAUZE 4X4 16PLY ~~LOC~~+RFID DBL (SPONGE) IMPLANT
GAUZE SPONGE 4X4 12PLY STRL (GAUZE/BANDAGES/DRESSINGS) IMPLANT
GLOVE BIO SURGEON STRL SZ8 (GLOVE) ×3 IMPLANT
GLOVE INDICATOR 8.5 STRL (GLOVE) ×4 IMPLANT
GLOVE SS BIOGEL STRL SZ 7.5 (GLOVE) ×1 IMPLANT
GLOVE SURG SYN 8.0 PF PI (GLOVE) ×3 IMPLANT
GOWN STRL REUS W/ TWL LRG LVL3 (GOWN DISPOSABLE) ×1 IMPLANT
GOWN STRL REUS W/ TWL XL LVL3 (GOWN DISPOSABLE) ×3 IMPLANT
GOWN STRL REUS W/TWL 2XL LVL3 (GOWN DISPOSABLE) IMPLANT
HEMOSTAT POWDER KIT SURGIFOAM (HEMOSTASIS) ×1 IMPLANT
HEMOSTAT POWDER SURGIFOAM 1G (HEMOSTASIS) IMPLANT
INSERT FOGARTY 61MM (MISCELLANEOUS) IMPLANT
INSERT FOGARTY SM (MISCELLANEOUS) IMPLANT
KIT BASIN OR (CUSTOM PROCEDURE TRAY) ×1 IMPLANT
KIT POSITIONER JACKSON TABLE (MISCELLANEOUS) ×1 IMPLANT
KIT TURNOVER KIT B (KITS) ×1 IMPLANT
MARKER SPHERE PSV REFLC NDI (MISCELLANEOUS) ×5 IMPLANT
NDL HYPO 18GX1.5 BLUNT FILL (NEEDLE) IMPLANT
NDL HYPO 22X1.5 SAFETY MO (MISCELLANEOUS) ×1 IMPLANT
NDL SPNL 18GX3.5 QUINCKE PK (NEEDLE) ×1 IMPLANT
PACK LAMINECTOMY NEURO (CUSTOM PROCEDURE TRAY) ×1 IMPLANT
PACK UNIVERSAL I (CUSTOM PROCEDURE TRAY) ×1 IMPLANT
PAD ARMBOARD POSITIONER FOAM (MISCELLANEOUS) ×5 IMPLANT
PENCIL SMOKE EVACUATOR (MISCELLANEOUS) ×1 IMPLANT
PIN BONE FIX 100 (PIN) IMPLANT
ROD SPINAL 5.5X45 TI CRVD (Rod) IMPLANT
SCREW CREO MIS 30 TULIP (Screw) IMPLANT
SCREW MOD CREO 5.5X45 (Screw) IMPLANT
SCREW SD MOD CREO 6.5X40 (Screw) IMPLANT
SCREW SD MOD CREO 6.5X45 (Screw) IMPLANT
SEALER BIPOLAR AQUA 6.0 (INSTRUMENTS) ×1 IMPLANT
SOLN 0.9% NACL POUR BTL 1000ML (IV SOLUTION) ×1 IMPLANT
SOLN STERILE WATER BTL 1000 ML (IV SOLUTION) ×1 IMPLANT
SPACER HEDRON IA 26X34X13 20D (Spacer) IMPLANT
SPIKE FLUID TRANSFER (MISCELLANEOUS) ×1 IMPLANT
SPONGE INTESTINAL PEANUT (DISPOSABLE) ×1 IMPLANT
SPONGE SURGIFOAM ABS GEL SZ50 (HEMOSTASIS) IMPLANT
SPONGE T-LAP 18X18 ~~LOC~~+RFID (SPONGE) ×2 IMPLANT
SPONGE T-LAP 4X18 ~~LOC~~+RFID (SPONGE) ×2 IMPLANT
STAPLER SKIN PROX 35W (STAPLE) IMPLANT
STRIP CLOSURE SKIN 1/2X4 (GAUZE/BANDAGES/DRESSINGS) IMPLANT
SUT MNCRL AB 4-0 PS2 18 (SUTURE) ×1 IMPLANT
SUT PDS AB 1 CTX 36 (SUTURE) ×2 IMPLANT
SUT PROLENE 4-0 RB1 .5 CRCL 36 (SUTURE) IMPLANT
SUT PROLENE 5 0 C1 (SUTURE) IMPLANT
SUT PROLENE 5 0 CC1 (SUTURE) IMPLANT
SUT PROLENE 6 0 BV (SUTURE) ×2 IMPLANT
SUT PROLENE 6 0 C 1 30 (SUTURE) IMPLANT
SUT SILK 0 TIES 10X30 (SUTURE) ×1 IMPLANT
SUT SILK 2 0 TIES 10X30 (SUTURE) ×1 IMPLANT
SUT SILK 2 0 TIES 17X18 (SUTURE) ×1 IMPLANT
SUT SILK 2 0SH CR/8 30 (SUTURE) IMPLANT
SUT SILK 3 0 TIES 10X30 (SUTURE) ×1 IMPLANT
SUT SILK 3 0SH CR/8 30 (SUTURE) IMPLANT
SUT VIC AB 0 CT1 18XCR BRD8 (SUTURE) ×1 IMPLANT
SUT VIC AB 2-0 CP2 18 (SUTURE) ×1 IMPLANT
SUT VIC AB 3-0 SH 8-18 (SUTURE) ×1 IMPLANT
SUT VIC AB 4-0 PS2 18 (SUTURE) ×1 IMPLANT
SYR 3ML LL SCALE MARK (SYRINGE) ×1 IMPLANT
TOWEL GREEN STERILE (TOWEL DISPOSABLE) ×2 IMPLANT
TOWEL GREEN STERILE FF (TOWEL DISPOSABLE) ×1 IMPLANT
TRAY FOLEY MTR SLVR 14FR STAT (SET/KITS/TRAYS/PACK) IMPLANT
TRAY FOLEY MTR SLVR 16FR STAT (SET/KITS/TRAYS/PACK) ×1 IMPLANT

## 2024-02-29 NOTE — Plan of Care (Signed)

## 2024-02-29 NOTE — Progress Notes (Signed)
 Assessment 63 y/o F w/ hx scoliosis, COPD who is s/p L5-S1 ALIF with perc screws (12/4). Intra-op events notable for avulsion of a branch off of the common iliac vein which was repaired primarily by vascular surgery  LOS: 0 days    Plan: Postop films AAT DAT DVT ppx 12/5 Pedal pulse checks with vitals Anticipate discharge on 12/6 AM Will remove dressings prior to discharge   Subjective: Pt awoke with expected back pain. No leg pain or foot weakness  Objective: Vital signs in last 24 hours: Temp:  [97.6 F (36.4 C)-97.9 F (36.6 C)] 97.6 F (36.4 C) (12/04 1310) Pulse Rate:  [73-87] 87 (12/04 1330) Resp:  [12-18] 14 (12/04 1330) BP: (126-160)/(54-71) 131/66 (12/04 1330) SpO2:  [90 %-95 %] 95 % (12/04 1330) Weight:  [59 kg] 59 kg (12/04 0609)  Intake/Output from previous day: No intake/output data recorded. Intake/Output this shift: Total I/O In: 1464 [I.V.:1000; Blood:114; IV Piggyback:350] Out: 800 [Urine:400; Blood:400]  Exam: RUE: 5/5 grip LUE: 5/5 grip RLE: 5/5 IP, 5/5 quad, 5/5 ham, 5/5 DF, 5/5 EHL, 5/5 PF LLE: 5/5 IP, 5/5 quad, 5/5 ham, 5/5 DF, 5/5 EHL, 5/5 PF Sensation to light intact Belly soft Posterior incisions covered with dressing Anterior incision c/d/i   Lab Results: No results for input(s): WBC, HGB, HCT, PLT in the last 72 hours. BMET No results for input(s): NA, K, CL, CO2, GLUCOSE, BUN, CREATININE, CALCIUM in the last 72 hours.  Studies/Results: DG O-ARM IMAGE ONLY/NO REPORT Result Date: 02/29/2024 There is no Radiologist interpretation  for this exam.  DG C-Arm 1-60 Min-No Report Result Date: 02/29/2024 Fluoroscopy was utilized by the requesting physician.  No radiographic interpretation.   DG C-Arm 1-60 Min-No Report Result Date: 02/29/2024 Fluoroscopy was utilized by the requesting physician.  No radiographic interpretation.   DG C-Arm 1-60 Min-No Report Result Date: 02/29/2024 Fluoroscopy was utilized by the  requesting physician.  No radiographic interpretation.   DG C-Arm 1-60 Min-No Report Result Date: 02/29/2024 Fluoroscopy was utilized by the requesting physician.  No radiographic interpretation.   DG C-Arm 1-60 Min-No Report Result Date: 02/29/2024 Fluoroscopy was utilized by the requesting physician.  No radiographic interpretation.   DG OR LOCAL ABDOMEN Result Date: 02/29/2024 CLINICAL DATA:  Elective surgery.  Evaluate for foreign body. EXAM: OR LOCAL ABDOMEN COMPARISON:  None Available. FINDINGS: Bowel gas pattern nonobstructive. Evidence patient's recent L5-S1 fusion with hardware intact. Several surgical clips project over the midline to left lower 1.7 cm oval density projects over the superior aspect of the right sacroiliac joint and 1.4 cm similar oval density projects just inferior to the right sacroiliac joint of uncertain clinical significance. Moderate degenerative change of the spine. Mild degenerative change of the hips. IMPRESSION: 1. Nonobstructive bowel gas pattern. 2. Two oval densities projecting over the right sacroiliac joint of uncertain clinical significance. 3. Evidence of patient's recent L5-S1 fusion with hardware intact. These results were called by telephone at the time of interpretation on 02/29/2024 at 11:10 am to OR nurse, who verbally acknowledged these results and states they are not looking for any particular foreign body. Electronically Signed   By: Toribio Agreste M.D.   On: 02/29/2024 11:11      Dorn JONELLE Glade 02/29/2024, 1:45 PM

## 2024-02-29 NOTE — Anesthesia Preprocedure Evaluation (Addendum)
 Anesthesia Evaluation  Patient identified by MRN, date of birth, ID band Patient awake    Reviewed: Allergy & Precautions, NPO status , Patient's Chart, lab work & pertinent test results  History of Anesthesia Complications Negative for: history of anesthetic complications  Airway Mallampati: II  TM Distance: >3 FB Neck ROM: Full    Dental  (+) Chipped   Pulmonary neg sleep apnea, COPD, Patient abstained from smoking.Not current smoker, former smoker SpO2 ~92-94% in preop. Not on O2 at home. Says her breathing feels fine/ at baseline.   Pulmonary exam normal breath sounds clear to auscultation       Cardiovascular Exercise Tolerance: Good METShypertension, Pt. on medications (-) CAD and (-) Past MI (-) dysrhythmias  Rhythm:Regular Rate:Normal - Systolic murmurs    Neuro/Psych  Headaches  Anxiety      negative psych ROS   GI/Hepatic Neg liver ROS,GERD  Medicated,,  Endo/Other  negative endocrine ROSneg diabetes    Renal/GU negative Renal ROS  negative genitourinary   Musculoskeletal  (+) Arthritis ,    Abdominal   Peds  Hematology   Anesthesia Other Findings Past Medical History: No date: Anxiety No date: Arthritis 09/11/2017: Cholecystitis No date: Constipation No date: COPD (chronic obstructive pulmonary disease) (HCC) No date: Family history of adverse reaction to anesthesia     Comment:  brother was allergic to anesthesia - heart stopped               beating on the surgery table No date: Gallbladder sludge No date: GERD (gastroesophageal reflux disease) No date: Headache     Comment:  used to have migraines No date: Hypertension No date: Palpitations     Comment:  hx No date: Pneumonia     Comment:  x 1 03/31/2015: Proximal phalanx fracture of finger No date: Staph infection     Comment:  hx  Reproductive/Obstetrics                              Anesthesia  Physical Anesthesia Plan  ASA: 3  Anesthesia Plan: General   Post-op Pain Management: Tylenol  PO (pre-op)* and Ketamine IV*   Induction: Intravenous  PONV Risk Score and Plan: 4 or greater and Ondansetron , Dexamethasone , Midazolam , TIVA and Propofol  infusion  Airway Management Planned: Oral ETT  Additional Equipment: None  Intra-op Plan:   Post-operative Plan: Extubation in OR  Informed Consent: I have reviewed the patients History and Physical, chart, labs and discussed the procedure including the risks, benefits and alternatives for the proposed anesthesia with the patient or authorized representative who has indicated his/her understanding and acceptance.     Dental advisory given  Plan Discussed with: CRNA and Surgeon  Anesthesia Plan Comments: (Discussed risks of anesthesia with patient, including PONV, sore throat, lip/dental/eye damage. Rare risks discussed as well, such as cardiorespiratory and neurological sequelae, and allergic reactions. Discussed the role of CRNA in patient's perioperative care. Patient understands. Patient has listed allergy to PCN - swelling as a child Severe blistering skin reaction (SJS/TEN)? No/unknown Liver or kidney injury caused by PCN? No/unknown Hemolytic anemia from PCN? No/unknown Drug fever? No/unknown Painful swollen joints? No/unknown Severe reaction involving inside of mouth, eye, or genital ulcers? No/unknown Based on current evidence Zachary dunker al, J Allergy Clin Immunol Pract, 2019), as well as latest [place] Perioperative Prophylaxis guidelines, will proceed with cefazolin use: Yes  )         Anesthesia Quick Evaluation

## 2024-02-29 NOTE — Op Note (Signed)
 DATE OF SERVICE: 02/29/2024  PATIENT:  [name]  63 y.o. female  PRE-OPERATIVE DIAGNOSIS:  L5/S1 collapsed disc with symptoms  POST-OPERATIVE DIAGNOSIS:  Same  PROCEDURE:   Anterior exposure of L5/S1 disc space  SURGEON:  Surgeons and Role: Panel 1:    * Darnella Dorn SAUNDERS, MD - Primary Panel 2:    * Magda Debby SAILOR, MD - Primary    * Gretta, Lonni PARAS, MD - Assisting  An experienced assistant was required given the complexity of this procedure and the standard of surgical care. My assistant helped with exposure through counter tension, suctioning, ligation and retraction to better visualize the surgical field.  My assistant expedited sewing during the case by following my sutures. Wherever I use the term we in the report, my assistant actively helped me with that portion of the procedure.  ANESTHESIA:   general  EBL: from my portion  BLOOD ADMINISTERED:none  DRAINS: none   LOCAL MEDICATIONS USED:  NONE  SPECIMEN:  none  COUNTS: confirmed correct.  TOURNIQUET:  none  PATIENT DISPOSITION:  PACU - hemodynamically stable.   Delay start of Pharmacological VTE agent (>24hrs) due to surgical blood loss or risk of bleeding: no  INDICATION FOR PROCEDURE: [name] is a 63 y.o. female with L5/S1 collapsed disc and radiculopathy symptoms. After careful discussion of risks, benefits, and alternatives the patient was offered ALIF. We specifically discussed risk of bleeding, injury to surrounding structures. The patient  understood and wished to proceed.  OPERATIVE FINDINGS: left common iliac vein injured during approach to disc space. Successfully repaired with interrupted prolene suture. Disc then exposed uneventfully.  DESCRIPTION OF PROCEDURE: After identification of the patient in the pre-operative holding area, the patient was transferred to the operating room. The patient was positioned supine on the operating room table. Anesthesia was induced. The abdomen  was prepped and draped in standard fashion. A surgical pause was performed confirming correct patient, procedure, and operative location.  Intraoperative fluoroscopy was used to mark the skin overlying the L5/S1 disc base.  A transverse incision was made in the left lower quadrant of the abdomen and carried down through the subcutaneous tissue until the anterior rectus fascia was identified.  This was skeletonized.  A longitudinal incision was made in the fascia.  A avascular plane was developed over the rectus muscle and extended to the lateral margin of the rectus muscle.  Here I entered the retroperitoneal space bluntly.  The peritoneal envelope was swept anteriorly.  The ureter was identified and swept anteriorly.  As I was exposing on the anterior aspect of the common iliac vein, a small branch avulsed.  This created fairly significant bleeding which worsened with initial attempts at repair.  I asked Dr. Gretta to come cystis.  He helped facilitate exposure and we were ultimately able to repair the venotomy with a 4-0 Prolene.  With this done we then developed a plane over the presacral fascia at the L5/S1 level.  Middle sacral vessels were identified and clipped.  We then isolated the disc base with hand-held retractors.  Next the [employer] self-retaining retractor system was brought in the field and prepared for manufactures instructions.  This was used to expose the disc base.  Dr. Darnella then introduced a needle into the L5/S1 disc space.  Fluoroscopy was used to confirm correct location.  With this done I turned the case over to Dr. Darnella.  Debby SAILOR. Magda, MD FACS [facility] Phone Number: [phone] 02/29/2024 10:14 AM

## 2024-02-29 NOTE — Anesthesia Procedure Notes (Signed)
 Procedure Name: Intubation Date/Time: 02/29/2024 7:59 AM  Performed by: Mannie Krystal LABOR, CRNAPre-anesthesia Checklist: Patient identified, Emergency Drugs available, Suction available and Patient being monitored Patient Re-evaluated:Patient Re-evaluated prior to induction Oxygen Delivery Method: Circle system utilized Preoxygenation: Pre-oxygenation with 100% oxygen Induction Type: IV induction Ventilation: Mask ventilation without difficulty and Oral airway inserted - appropriate to patient size Laryngoscope Size: Cleotilde and 2 Grade View: Grade I Tube type: Oral Tube size: 6.5 mm Number of attempts: 1 Airway Equipment and Method: Stylet and Oral airway Placement Confirmation: ETT inserted through vocal cords under direct vision, positive ETCO2 and breath sounds checked- equal and bilateral Secured at: 22 cm Tube secured with: Tape Dental Injury: Teeth and Oropharynx as per pre-operative assessment

## 2024-02-29 NOTE — Op Note (Signed)
 PREOP DIAGNOSIS:  L5 radiculopathy, positive sagittal balance, severe L5-S1 degenerative disease  POSTOP DIAGNOSIS: Same  PROCEDURE: Stage 1 L5-S1 anterior lumbar interbody fusion Stage 2 L5-S1 posterolateral arthrodesis and percutaneous pedicle screw fixation  SURGEON: Dorn Glade, MD  CO-SURGEON FOR STAGE 1: Debby Robertson, MD, Lonni Gaskins, MD  ASSISTANT FOR STAGE 2: Dorn Debby, MD  ANESTHESIA: General Endotracheal  EBL: 400 cc  SPECIMENS: None  DRAINS: None  COMPLICATIONS: None  CONDITION: Stable  HISTORY: [name] is a 63 y.o. female with history of COPD who presented with severe low back pain and right lower extremity radiculopathy.  Imaging studies showed positive sagittal balance, severe L5-S1 degenerative disc disease with bilateral neuroforaminal stenosis and complete loss of disc height. She underwent several weeks of physical therapy.  She had a temporary response to L5-S1 injection..  I offered an L5-S1 ALIF. I explained the risks of bleeding, infection, hardware failure, pseudoarthrosis, damage to internal organs, DVT, general anesthesia risk, CSF leak, nerve root injury, mortality.  They verbalized understanding and wanted to proceed  PROCEDURE IN DETAIL:  Stage 1  The patient was brought to the operating room. After induction of general anesthesia, the patient was positioned on the operative table in the supine position. All pressure points were meticulously padded. Skin incision was then marked out with fluoroscopy and prepped and draped in the usual sterile fashion.  Timeout was performed.  Reoperative antibiotics were administered. Please refer to Dr. Cleda operative note for details on the laparotomy for spinal exposure. I acted as geophysicist/field seismologist during the laparotomy exposure.  Self-retaining retractors were in place with full view of the L5-S1 annulus.  We first marked the midline using AP fluoroscopy.  Scalpel was used to incise the annulus  as far laterally as we could go.  Cobb was then used to dissect the bulk of the disc from the L5 and S1 endplates.  Afterwards the disc fragments were removed.  An expandable trial was used to expand the disc space and working area.  We then used combination of curettes, rongeurs, and rasps to remove the remaining disc and endplate cartilage.  The posterior longitudinal ligament was visible at this point.  We also used angled curettes and rongeur's to resect part of the osteophyte from the bilateral L5 foramen. We then turned our attention to placement of the interbody cage.  We used trial spacers under fluoroscopy to select a Globus Hedron 20 degree 13 mm cage.  This was filled with allograft and demineralized bone matrix and inserted into the L5-S1 disc space. Appropriate position was verified under AP and lateral fluoroscopy.  We then malleted angled shims to anchor the cage into the disc space.  Again fluoroscopy was used to confirm appropriate placement.  The cage was final tightened.  Self-retaining retractors were removed.  The retroperitoneum was inspected and hemostasis achieved.  The rectus fascia was closed with running Prolene.  The subcutaneous tissue was closed with interrupted Vicryl stitches.  The skin was closed with Monocryl and glue.   At the end of the case all sponge, needle, and instrument counts were correct.  Abdominal x-rays were used to ensure no retained objects.    Stage 2  Under the same anesthesia, the patient was positioned prone on a Jackson table.  All pressure points were meticulously padded.  The surgical site was prepped and draped in the usual sterile fashion.  I first made a stab incision over the left posterior superior iliac spine.  I then malleted a  percutaneous navigation frame into the PSIS.  We then performed an intra operative O-arm spin.  Navigated probe was used to plan the skin entry points for the L5 and S1 pedicle screws.  Sharp incision was made over the  entry points bilaterally.  Bovie electrocautery was used to dissect past the fascia to the level of the screw entry point.  Navigated high-speed bur was used to create a pilot hole.  Navigated tap was used to the pedicle screw trajectory.  Navigated screwdriver was used to place the screw shank.  This was performed at bilateral L5 and S1.  Screw sizes varied from 5.5-6.5 mm in diameter and 40-75mm in length.  AP and lateral fluoroscopy were used to confirm appropriate placement.  We then used a navigated high-speed bur to decorticate the bilateral L5-S1 facets for arthrodesis.  We used a navigated probe to position a funnel over the decorticated facet joints.  We then inserted allograft through the funnel  onto the decorticated facet joints for arthrodesis.  We then placed percutaneous tulip heads and towers on all of the pedicle screw shanks.  Bilateral 5.5 mm titanium rods were passed percutaneously.  Setscrews were used to secure the rod and final tightened.  Fluoroscopy was used to confirm appropriate rod length.  Percutaneous towers were removed  The wounds were copiously irrigated.  The percutaneous navigation tower was removed.  Hemostasis was achieved.  The fascia was closed with 0 Vicryl.  The subcutaneous tissue was closed with interrupted Vicryl.  The skin was closed with Vicryl and glue.  The patient was then transferred to the stretcher, extubated, and taken to the post-anesthesia care unit in stable hemodynamic condition.

## 2024-02-29 NOTE — Transfer of Care (Signed)
 Immediate Anesthesia Transfer of Care Note  Patient: [name]  Procedure(s) Performed: ANTERIOR LUMBAR FUSION LUMBAR FIVE-SACRAL ONE APPLICATION OF O-ARM LUMBAR PERCUTANEOUS PEDICLE SCREW 1 LEVEL ABDOMINAL EXPOSURE  Patient Location: PACU  Anesthesia Type:General  Level of Consciousness: drowsy  Airway & Oxygen Therapy: Patient Spontanous Breathing and Patient connected to face mask oxygen  Post-op Assessment: Report given to RN and Post -op Vital signs reviewed and stable  Post vital signs: Reviewed and stable  Last Vitals:  Vitals Value Taken Time  BP 126/60 02/29/24 13:15  Temp    Pulse 74 02/29/24 13:24  Resp 14 02/29/24 13:24  SpO2 95 % 02/29/24 13:24  Vitals shown include unfiled device data.  Last Pain:  Vitals:   02/29/24 1310  TempSrc:   PainSc: Asleep      Patients Stated Pain Goal: 3 (02/29/24 9361)  Complications: No notable events documented.

## 2024-02-29 NOTE — Anesthesia Postprocedure Evaluation (Signed)
 Anesthesia Post Note  Patient: [name]  Procedure(s) Performed: ANTERIOR LUMBAR FUSION LUMBAR FIVE-SACRAL ONE APPLICATION OF O-ARM LUMBAR PERCUTANEOUS PEDICLE SCREW 1 LEVEL ABDOMINAL EXPOSURE     Patient location during evaluation: PACU Anesthesia Type: General Level of consciousness: awake and alert Pain management: pain level controlled Vital Signs Assessment: post-procedure vital signs reviewed and stable Respiratory status: spontaneous breathing, nonlabored ventilation, respiratory function stable and patient connected to nasal cannula oxygen Cardiovascular status: blood pressure returned to baseline and stable Postop Assessment: no apparent nausea or vomiting Anesthetic complications: no   No notable events documented.  Last Vitals:  Vitals:   02/29/24 1400 02/29/24 1415  BP: (!) 117/59 130/65  Pulse: 81 80  Resp: 20 16  Temp:    SpO2: 94% (!) 88%    Last Pain:  Vitals:   02/29/24 1350  TempSrc:   PainSc: 10-Worst pain ever                 Rome Ade

## 2024-02-29 NOTE — Interval H&P Note (Signed)
 History and Physical Interval Note:  02/29/2024 7:32 AM  [name]  has presented today for surgery, with the diagnosis of LUMBAR RADICULOPATHY.  The various methods of treatment have been discussed with the patient and family. After consideration of risks, benefits and other options for treatment, the patient has consented to  Procedure(s) with comments: ANTERIOR LUMBAR FUSION 1 LEVEL (N/A) - L5-S1 ALIF, PERC PED SCREW FIXATION AND POSTEROLATERAL ARTHRODESIS APPLICATION OF O-ARM (N/A) LUMBAR PERCUTANEOUS PEDICLE SCREW 1 LEVEL (N/A) ABDOMINAL EXPOSURE (N/A) as a surgical intervention.  The patient's history has been reviewed, patient examined, no change in status, stable for surgery.  I have reviewed the patient's chart and labs.  Questions were answered to the patient's satisfaction.     [name]

## 2024-02-29 NOTE — H&P (Signed)
 Neurosurgery Consult Note  Assessment:  63 y/o F w/ hx scoliosis, COPD who presented with LBP and RLE pain refractory to conservative mgmt. Imaging shows L5-S1 severe DDD  Plan:  L5-S1 ALIF with perc screws     CC: right leg pain  HPI:      63 year old female history of scoliosis, COPD presents as an injection follow-up. As you may recall, she was initially seen for progressive low back pain and right lower extremity radiculopathy going to the bottom of the right foot. She had been undergoing greater than 6 weeks of physical therapy which did not help her pain. Imaging studies showed positive sagittal balance, lumbar scoliosis, L2-3 severe degenerative disc disease, L5-S1 severe degenerative disc disease. She underwent an L5-S1 ESI last month. She experienced 60% improvement in her low back pain and near resolution of her right radiculopathy for a few weeks. This substantially benefit her activities of daily living. However, she has been experiencing return of back pain and radiculopathy over the previous few daysHer albumin was normal in December She stopped smoking a few months ago     Patient Active Problem List   Diagnosis Date Noted   Lipoma 06/19/2023   Odynophagia 04/19/2023   Chronic sinusitis 04/19/2023   Leg swelling 04/19/2023   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   GERD (gastroesophageal reflux disease) 04/03/2023   Lumbar radiculopathy, chronic 03/02/2022   Bilateral sciatica 02/02/2022   Encounter for general adult medical examination with abnormal findings 02/02/2022   Carpal tunnel syndrome on both sides 02/02/2022   Idiopathic peripheral neuropathy 02/02/2022   Cervical radiculopathy 02/02/2022   Preop examination 09/30/2021   Primary osteoarthritis of both hands 08/19/2021   Chronic obstructive pulmonary disease (HCC) 08/19/2021   Trigger ring finger of left hand 08/19/2021   Chronic idiopathic constipation 08/19/2021   Lymphadenopathy 08/11/2021   Tobacco abuse  09/28/2018   Essential hypertension 09/28/2018   Past Medical History:  Diagnosis Date   Anxiety    Arthritis    Cholecystitis 09/11/2017   Constipation    COPD (chronic obstructive pulmonary disease) (HCC)    Family history of adverse reaction to anesthesia    brother was allergic to anesthesia - heart stopped beating on the surgery table   Gallbladder sludge    GERD (gastroesophageal reflux disease)    Headache    used to have migraines   Hypertension    Palpitations    hx   Pneumonia    x 1   Proximal phalanx fracture of finger 03/31/2015   Staph infection    hx    Past Surgical History:  Procedure Laterality Date   CHOLECYSTECTOMY N/A 09/13/2017   Procedure: LAPAROSCOPIC CHOLECYSTECTOMY;  Surgeon: Kallie Manuelita BROCKS, MD;  Location: [facility];  Service: General;  Laterality: N/A;   GROIN DEBRIDEMENT     due to spider bite,    OPEN REDUCTION INTERNAL FIXATION (ORIF) PROXIMAL PHALANX Right 04/07/2015   Procedure: OPEN REDUCTION INTERNAL FIXATION (ORIF) PROXIMAL PHALANX;  Surgeon: Glendia Cordella Hutchinson, MD;  Location: [facility];  Service: Orthopedics;  Laterality: Right;   TONSILLECTOMY     TUBAL LIGATION      Medications Prior to Admission  Medication Sig Dispense Refill Last Dose/Taking   albuterol  (VENTOLIN  HFA) 108 (90 Base) MCG/ACT inhaler Inhale 2 puffs into the lungs every 6 (six) hours as needed for wheezing or shortness of breath. 18 g 5 02/29/2024 at  5:15 AM   amLODipine  (NORVASC ) 10 MG tablet Take 1 tablet by mouth once daily  90 tablet 0 02/29/2024 at  5:00 AM   Budeson-Glycopyrrol-Formoterol  (BREZTRI  AEROSPHERE) 160-9-4.8 MCG/ACT AERO Inhale 2 puffs into the lungs 2 (two) times daily. 10.7 g 11 02/29/2024 at  5:15 AM   omeprazole  (PRILOSEC) 40 MG capsule Take 1 capsule (40 mg total) by mouth daily. 30 capsule 3 02/29/2024 at  5:00 AM   telmisartan -hydrochlorothiazide  (MICARDIS  HCT) 80-25 MG tablet Take 1 tablet by mouth daily. 90 tablet 1 02/29/2024 at  5:00 AM   fluticasone   (FLONASE ) 50 MCG/ACT nasal spray Place 2 sprays into both nostrils daily. (Patient not taking: Reported on 02/16/2024) 16 g 1 Not Taking   guaiFENesin -codeine  100-10 MG/5ML syrup Take 5 mLs by mouth 3 (three) times daily as needed for cough. (Patient not taking: Reported on 02/16/2024) 120 mL 0 Not Taking   magic mouthwash (lidocaine , diphenhydrAMINE , alum & mag hydroxide) suspension Swish and swallow 5 mLs 3 (three) times daily as needed for mouth pain. (Patient not taking: Reported on 02/16/2024) 360 mL 0 Not Taking   meloxicam  (MOBIC ) 7.5 MG tablet Take 1 tablet (7.5 mg total) by mouth daily. (Patient not taking: Reported on 02/16/2024) 30 tablet 2 Not Taking   methocarbamol  (ROBAXIN ) 500 MG tablet Take 1 tablet (500 mg total) by mouth every 8 (eight) hours as needed for muscle spasms. (Patient not taking: Reported on 02/16/2024) 20 tablet 0 Not Taking   ondansetron  (ZOFRAN -ODT) 4 MG disintegrating tablet Take 1 tablet (4 mg total) by mouth every 8 (eight) hours as needed for nausea or vomiting. (Patient not taking: Reported on 02/16/2024) 20 tablet 0 Not Taking   phenazopyridine  (PYRIDIUM ) 200 MG tablet Take 1 tablet (200 mg total) by mouth 3 (three) times daily as needed for pain. (Patient not taking: Reported on 02/16/2024) 6 tablet 0 Not Taking   predniSONE  (STERAPRED UNI-PAK 21 TAB) 10 MG (21) TBPK tablet Take as package instructions. (Patient not taking: Reported on 02/16/2024) 1 each 0 Not Taking   sulfamethoxazole -trimethoprim  (BACTRIM  DS) 800-160 MG tablet Take 1 tablet by mouth 2 (two) times daily. (Patient not taking: Reported on 02/16/2024) 10 tablet 0 Not Taking   Allergies  Allergen Reactions   Penicillins Swelling    Throat swelling Has patient had a PCN reaction causing immediate rash, facial/tongue/throat swelling, SOB or lightheadedness with hypotension: Yes Has patient had a PCN reaction causing severe rash involving mucus membranes or skin necrosis: No Has patient had a PCN  reaction that required hospitalization  ED visit but no hospitalization Has patient had a PCN reaction occurring within the last 10 years: No If all of the above answers are NO, then may proceed with Cephalosporin use.   Tape Rash    Social History   Tobacco Use   Smoking status: Former    Current packs/day: 0.00    Types: Cigarettes    Quit date: 2025    Years since quitting: 0.9   Smokeless tobacco: Never   Tobacco comments:    Quit smoking on 01/26/24.  Substance Use Topics   Alcohol use: Not Currently    Comment: wine occ    Family History  Problem Relation Age of Onset   COPD Mother    Diabetes Father      Review of Systems Pertinent items are noted in HPI.  Objective:   Patient Vitals for the past 8 hrs:  BP Temp Temp src Pulse Resp SpO2 Height Weight  02/29/24 0609 (!) 160/71 97.9 F (36.6 C) Oral 75 18 92 % 5' (1.524 m) 59 kg  No intake/output data recorded. No intake/output data recorded.    Exam: RUE: 5/5 deltoid, 5/5 bicep, 5/5 wrist extension, 5/5 tricep, 5/5 grip LUE: 5/5 deltoid, 5/5 bicep, 5/5 wrist extension, 5/5 tricep, 5/5 grip RLE: 5/5 IP, 5/5 quad, 5/5 ham, 5/5 DF, 5/5 EHL, 5/5 PF LLE: 5/5 IP, 5/5 quad, 5/5 ham, 5/5 DF, 5/5 EHL, 5/5 PF Sensation to light intact      Data ReviewCBC:  Lab Results  Component Value Date   WBC 9.0 02/20/2024   RBC 5.13 (H) 02/20/2024   BMP:  Lab Results  Component Value Date   GLUCOSE 118 (H) 02/20/2024   CO2 25 02/20/2024   BUN 12 02/20/2024   BUN 13 03/07/2022   CREATININE 0.66 02/20/2024   CALCIUM 9.3 02/20/2024

## 2024-02-29 NOTE — TOC CM/SW Note (Signed)
 TOC consult received for d/c planning needs, possible SNF vs. HH. Unit CM to f/u with patient as appropriate.   Merilee Batty, MSN, RN Case Management [phone]

## 2024-03-01 ENCOUNTER — Encounter (HOSPITAL_COMMUNITY): Payer: Self-pay

## 2024-03-01 ENCOUNTER — Observation Stay (HOSPITAL_COMMUNITY)

## 2024-03-01 MED ORDER — DOCUSATE SODIUM 100 MG PO CAPS: 100.0000 mg | ORAL_CAPSULE | Freq: Every day | ORAL | 2 refills | Status: AC

## 2024-03-01 MED ORDER — CYCLOBENZAPRINE HCL 10 MG PO TABS: 10.0000 mg | ORAL_TABLET | Freq: Three times a day (TID) | ORAL | 3 refills | Status: AC | PRN

## 2024-03-01 MED ORDER — OXYCODONE HCL 5 MG PO TABS: 5.0000 mg | ORAL_TABLET | ORAL | 0 refills | Status: AC | PRN

## 2024-03-01 NOTE — Evaluation (Addendum)
 Occupational Therapy Evaluation Patient Details Name: [name] MRN: [id number] DOB: 01-15-1961 Today's Date: 03/01/2024   History of Present Illness   63 yo female 12/3 ALIF L5-S1 PLA and precutaneous pedicle screw fixation L5-S1. PMH scoliosis, COPD,DDD, neuropathy     Clinical Impressions Patient admitted for the procedure above.  PTA she lives at home with her spouse and sister.  Patient remained Mod I at home with ADL,iADL and mobility.  Primary deficit is O2 sats on RA, see PT note.  Otherwise she is walking in the room ad lib, and needed no assist for ADL completion.  Verbal cues for log roll.  Patient has no significant OT needs in the acute setting, and no post acute OT is anticipated.  Recommend follow up as recommended by MD.       If plan is discharge home, recommend the following:   Assistance with cooking/housework;Assist for transportation     Functional Status Assessment   Patient has not had a recent decline in their functional status     Equipment Recommendations   None recommended by OT     Recommendations for Other Services         Precautions/Restrictions   Precautions Precautions: Fall;Back Precaution Booklet Issued: No Restrictions Weight Bearing Restrictions Per Provider Order: No     Mobility Bed Mobility Overal bed mobility: Modified Independent               Patient Response: Cooperative  Transfers Overall transfer level: Modified independent                        Balance Overall balance assessment: No apparent balance deficits (not formally assessed)                                         ADL either performed or assessed with clinical judgement   ADL Overall ADL's : At baseline                                             Vision Patient Visual Report: No change from baseline       Perception Perception: Not tested       Praxis Praxis: Not tested        Pertinent Vitals/Pain Pain Assessment Pain Assessment: Faces Faces Pain Scale: Hurts a little bit Pain Location: Low back Pain Descriptors / Indicators: Sore Pain Intervention(s): Premedicated before session     Extremity/Trunk Assessment Upper Extremity Assessment Upper Extremity Assessment: Overall WFL for tasks assessed   Lower Extremity Assessment Lower Extremity Assessment: Defer to PT evaluation   Cervical / Trunk Assessment Cervical / Trunk Assessment: Back Surgery   Communication Communication Communication: No apparent difficulties Factors Affecting Communication: Reduced clarity of speech   Cognition Arousal: Alert Behavior During Therapy: Anxious Cognition: No apparent impairments                               Following commands: Intact       Cueing  General Comments   Cueing Techniques: Verbal cues  Walking sats performed (see other note), and pt would benefit from O2 at home. Not on O2 at baseline. VC for incentive spirometry use and back precautions.  Exercises     Shoulder Instructions      Home Living Family/patient expects to be discharged to:: Private residence Living Arrangements: Spouse/significant other;Other relatives Available Help at Discharge: Family;Available 24 hours/day Type of Home: House Home Access: Stairs to enter [employer] of Steps: 2 Entrance Stairs-Rails:  (1 rail, pt unsure of which side) Home Layout: One level     Bathroom Shower/Tub: Tub/shower unit;Walk-in shower   Bathroom Toilet: Standard Bathroom Accessibility: Yes   Home Equipment: Shower seat          Prior Functioning/Environment Prior Level of Function : Independent/Modified Independent;Working/employed             Mobility Comments: Typically walking with no AD. ADLs Comments: Independent. 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    OT Problem List: Pain   OT Treatment/Interventions:        OT Goals(Current  goals can be found in the care plan section)   Acute Rehab OT Goals Patient Stated Goal: Return home OT Goal Formulation: With patient Time For Goal Achievement: 03/04/24 Potential to Achieve Goals: Good   OT Frequency:       Co-evaluation              AM-PAC OT 6 Clicks Daily Activity     Outcome Measure Help from another person eating meals?: None Help from another person taking care of personal grooming?: None Help from another person toileting, which includes using toliet, bedpan, or urinal?: None Help from another person bathing (including washing, rinsing, drying)?: None Help from another person to put on and taking off regular upper body clothing?: None Help from another person to put on and taking off regular lower body clothing?: None 6 Click Score: 24   End of Session Nurse Communication: Mobility status  Activity Tolerance: Patient tolerated treatment well Patient left: in chair;with call bell/phone within reach  OT Visit Diagnosis: Pain                Time: 8984-8968 OT Time Calculation (min): 16 min Charges:  OT General Charges $OT Visit: 1 Visit OT Evaluation $OT Eval Moderate Complexity: 1 Mod  03/01/2024  RP, OTR/L  Acute Rehabilitation Services  Office:  [phone]   Charlie JONETTA Halsted 03/01/2024, 10:36 AM

## 2024-03-01 NOTE — Progress Notes (Signed)
 SATURATION QUALIFICATIONS: (This note is used to comply with regulatory documentation for home oxygen)  Patient Saturations on Room Air at Rest = 89%  Patient Saturations on Room Air while Ambulating = 80%  Patient Saturations on 4 Liters of oxygen while Ambulating = 90%  Please briefly explain why patient needs home oxygen: to maintain O2 sats >88% while ambulating.   Mayana Irigoyen, SPT

## 2024-03-01 NOTE — Discharge Summary (Addendum)
 Physician Discharge Summary  Patient ID: [name] MRN: [id number] DOB/AGE: February 27, 1961 63 y.o.  Admit date: 02/29/2024 Discharge date: 03/01/2024  Admission Diagnoses:  L5 radiculopathy, severe L5-S1 DDD, positive sagittal balance  Discharge Diagnoses:  Same Principal Problem:   Elective surgery   Discharged Condition: Stable  Hospital Course:  [name] is a 63 y.o. female w/ hx COPD, scoliosis, former smoker who presented with back pain, positive sagittal balance, and RLE pain. Refractory to PT. L5-S1 ESI provided temporary relief. She underwent L5-S1 ALIF and perc screws posterolateral arthrodesis on 12/4. Notable for common iliac vein branch avulsion that was repaired in the OR Postoperatively, the patient was mobilized with the help PT, pain was well-controlled with oral meds. Patient reported improved ambulation ability postop. She reports baseline SO2 in the 90-92% range but was recorded at 80% while ambulating with PT. Recorded at 89% at rest. She was placed on 2 liters of oxygen with plans to send her home with it and close outpatient follow-up.  Patient stated her breathing feels at baseline. She is eager to go home.  Patient was deemed ready for discharge from both neurosurgery and vasuclar surgery standpoint. She was discharged with oxycodone , flexeril , and a stool softener to take while on narcotics  Treatments: Surgery - L5-S1 ALIF with percutaneous screws and posterolateral arthrodesis  Discharge Exam: Blood pressure 132/69, pulse (!) 106, temperature 98.9 F (37.2 C), resp. rate 19, height 5' (1.524 m), weight 59 kg, SpO2 97%. Awake, alert, oriented Speech fluent, appropriate CN grossly intact 5/5 BUE/BLE Wound c/d/i  Disposition: Discharge disposition: 01-Home or Self Care       Discharge Instructions     Diet - low sodium heart healthy   Complete by: As directed    If the dressing is still on your incision site when you go home, remove it on the  third day after your surgery date. Remove dressing if it begins to fall off, or if it is dirty or damaged before the third day.   Complete by: As directed    Incentive spirometry RT   Complete by: As directed    Increase activity slowly   Complete by: As directed       Allergies as of 03/01/2024       Reactions   Penicillins Swelling   Throat swelling as a child. TOLERATED CEFAZOLIN  02/29/24   Tape Rash        Medication List     TAKE these medications    albuterol  108 (90 Base) MCG/ACT inhaler Commonly known as: VENTOLIN  HFA Inhale 2 puffs into the lungs every 6 (six) hours as needed for wheezing or shortness of breath.   amLODipine  10 MG tablet Commonly known as: NORVASC  Take 1 tablet by mouth once daily   Breztri  Aerosphere 160-9-4.8 MCG/ACT Aero inhaler Generic drug: budesonide -glycopyrrolate -formoterol  Inhale 2 puffs into the lungs 2 (two) times daily.   cyclobenzaprine  10 MG tablet Commonly known as: FLEXERIL  Take 1 tablet (10 mg total) by mouth 3 (three) times daily as needed for muscle spasms.   docusate sodium  100 MG capsule Commonly known as: Colace Take 1 capsule (100 mg total) by mouth daily.   fluticasone  50 MCG/ACT nasal spray Commonly known as: FLONASE  Place 2 sprays into both nostrils daily.   guaiFENesin -codeine  100-10 MG/5ML syrup Take 5 mLs by mouth 3 (three) times daily as needed for cough.   magic mouthwash (lidocaine , diphenhydrAMINE , alum & mag hydroxide) suspension Swish and swallow 5 mLs 3 (three) times daily as  needed for mouth pain.   meloxicam  7.5 MG tablet Commonly known as: MOBIC  Take 1 tablet (7.5 mg total) by mouth daily.   methocarbamol  500 MG tablet Commonly known as: ROBAXIN  Take 1 tablet (500 mg total) by mouth every 8 (eight) hours as needed for muscle spasms.   omeprazole  40 MG capsule Commonly known as: PRILOSEC Take 1 capsule (40 mg total) by mouth daily.   ondansetron  4 MG disintegrating tablet Commonly known  as: ZOFRAN -ODT Take 1 tablet (4 mg total) by mouth every 8 (eight) hours as needed for nausea or vomiting.   oxyCODONE  5 MG immediate release tablet Commonly known as: Oxy IR/ROXICODONE  Take 1 tablet (5 mg total) by mouth every 4 (four) hours as needed for moderate pain (pain score 4-6).   phenazopyridine  200 MG tablet Commonly known as: PYRIDIUM  Take 1 tablet (200 mg total) by mouth 3 (three) times daily as needed for pain.   predniSONE  10 MG (21) Tbpk tablet Commonly known as: STERAPRED UNI-PAK 21 TAB Take as package instructions.   sulfamethoxazole -trimethoprim  800-160 MG tablet Commonly known as: BACTRIM  DS Take 1 tablet by mouth 2 (two) times daily.   telmisartan -hydrochlorothiazide  80-25 MG tablet Commonly known as: MICARDIS  HCT Take 1 tablet by mouth daily.               Durable Medical Equipment  (From admission, onward)           Start     Ordered   03/01/24 0928  For home use only DME oxygen  Once       Question Answer Comment  Length of Need 6 Months   Liters per Minute 2   Frequency Continuous (stationary and portable oxygen unit needed)   Oxygen delivery system: Gas   Oxygen delivery system: Concentrator      03/01/24 0928              Discharge Care Instructions  (From admission, onward)           Start     Ordered   03/01/24 0000  If the dressing is still on your incision site when you go home, remove it on the third day after your surgery date. Remove dressing if it begins to fall off, or if it is dirty or damaged before the third day.        03/01/24 1055            Follow-up Information     [employer] (DME) Follow up.   Specialty: DME Services Why: Company providing oxygen and [employer] Officer information: [address] [place]  [address] [phone]                Signed: Dorn JONELLE Glade 03/01/2024, 11:45 AM

## 2024-03-01 NOTE — Progress Notes (Signed)
 Patient ID: [name], female   DOB: 06/08/1960, 63 y.o.   MRN: [id number]  Examined on rounds. Doing great. No issues identified in incision or in lower extremities. Safe for discharge from my standpoint. Follow up with vascular on PRN basis.   Debby SAILOR. Magda, MD [facility] Phone Number: [phone] 03/01/2024 12:18 PM

## 2024-03-01 NOTE — Progress Notes (Signed)
 Patient awaiting family for discharge home, Patient in no acute distress with oxygen via [facility] @4L  in place. Incision on back and abdomen is clean, dry and intact;  c/o pain at this time and pain medication given prior to discharge. Room was checked and accounted for all patient's belongings; discharge instructions concerning her medications, incision care, follow up appointment and when to call the doctor as needed were all discussed with patient and husband by RN and they expressed understanding on the instructions given. Home use Oxygen was delivered for patient by [employer] rep.

## 2024-03-01 NOTE — Discharge Instructions (Addendum)
 Wound Care Please take your dressing off 3 days after surgery. Afterwards, no dressing is required over your incision. Keep clean and dry. You can shower 24 hours after surgery. Let water run over incision. Do not scrub. Pat dry Do not put any creams, lotions, or ointments on incision. Do not submerge your incision for the first 6 weeks (pool, ocean, etc)  Activity Walk each and every day, increasing distance each day. No lifting greater than 10 lbs.  Avoid excessive back motion.  Diet Resume your normal diet.  Call Your Doctor If Any of These Occur Redness, drainage, or swelling at the wound.  Temperature greater than 101 degrees. Severe pain not relieved by pain medication. Incision starts to come apart.  You have been prescribed oxygen to take home because your oxygen levels were low when working with physical therapy. Please schedule a follow-up with your primary care physician in the next week to monitor oxygen management. Thank you  Follow Up Appt Call [phone] today for appointment in 6 weeks if you don't already have one or for any problems.  If you have any hardware placed in your spine, you will need an x-ray before your appointment.

## 2024-03-01 NOTE — TOC Transition Note (Signed)
 Transition of Care [facility]) - Discharge Note   Patient Details  Name: [name] MRN: [id number] Date of Birth: 1960/10/22  Transition of Care [facility] CM/SW Contact:  Andrez JULIANNA George, RN Phone Number: 03/01/2024, 11:22 AM   Clinical Narrative:     Pt is discharging home with self care. Pt did qualify for home oxygen when ambulating with PT.  Orders sent to [facility] and they will deliver oxygen to the room for transport home. Concentrator to the home.  Pt has transport home.  Final next level of care: Home/Self Care Barriers to Discharge: No Barriers Identified   Patient Goals and CMS Choice   CMS Medicare.gov Compare Post Acute Care list provided to:: Patient Choice offered to / list presented to : Patient      Discharge Placement                       Discharge Plan and Services Additional resources added to the After Visit Summary for                  DME Arranged: Oxygen DME Agency: [employer] Date DME Agency Contacted: 03/01/24 Time DME Agency Contacted: 1122 Representative spoke with at DME Agency: Jermaine            Social Drivers of Health (SDOH) Interventions SDOH Screenings   Depression (PHQ2-9): High Risk (09/19/2023)  Tobacco Use: Medium Risk (02/29/2024)     Readmission Risk Interventions     No data to display

## 2024-03-01 NOTE — Evaluation (Signed)
 Physical Therapy Evaluation Patient Details Name: [name] MRN: [id number] DOB: 10-14-1960 Today's Date: 03/01/2024  History of Present Illness  63 yo female 12/3 ALIF L5-S1 PLA and precutaneous pedicle screw fixation L5-S1. PMH scoliosis, COPD,DDD, neuropathy  Clinical Impression  PTA, pt living spouse and sibling in single story home with 2 STE, working, independent with ADLs, and typically walking with no AD. Currently, pt modI for bed mobility, with cues for log rolling required. Pt appears cognitively intact, but was intermittently very lethargic while in sitting and supine, requiring light stimulation to maintain alertness. Able to ambulate 78' with RW and 79' with no AD at supervision level, with pt demonstrating overall steady gait with both. Able to ascend and  and descend 2 stairs with unilateral handrail at supervision level as well. Walking sats performed (see completed note). Pt able to teach-back precautions and demonstrate good technique with IS following VC. Plan to follow physician recommendations for f/u therapy as appropriate. Pt will continue to benefit from acute PT to improve activity tolerance and endurance with functional mobility.       If plan is discharge home, recommend the following: A little help with walking and/or transfers   Can travel by private vehicle        Equipment Recommendations None recommended by PT  Recommendations for Other Services       Functional Status Assessment Patient has had a recent decline in their functional status and demonstrates the ability to make significant improvements in function in a reasonable and predictable amount of time.     Precautions / Restrictions Precautions Precautions: Fall;Back Precaution Booklet Issued: Yes (comment) Recall of Precautions/Restrictions: Intact Restrictions Weight Bearing Restrictions Per Provider Order: No      Mobility  Bed Mobility Overal bed mobility: Modified Independent              General bed mobility comments: Inc time and VC for log rolling.    Transfers Overall transfer level: Needs assistance Equipment used: Rolling walker (2 wheels), None Transfers: Sit to/from Stand Sit to Stand: Supervision           General transfer comment: Supervision for safety. Initially utilized RW from bed, but pt fairly steady with it and attempted without RW from toilet. Remains steady, no LOB    Ambulation/Gait Ambulation/Gait assistance: Supervision Gait Distance (Feet): 150 Feet Assistive device: Rolling walker (2 wheels), None Gait Pattern/deviations: Step-through pattern Gait velocity: Dec Gait velocity interpretation: >2.62 ft/sec, indicative of community ambulatory   General Gait Details: Overal steady, no LOB noted. Slight inc lateral sway bilaterally. Initiated gait with RW, but pt very steady with this. With no AD, pt remains to have overall steady gait.  Stairs Stairs: Yes Stairs assistance: Supervision Stair Management: One rail Right, Forwards, Alternating pattern Number of Stairs: 2 General stair comments: No overt LOB or unsteadiness noted. Supervision for safety. Ascended and descended 2 stairs with unilateral hand rail  Wheelchair Mobility     Tilt Bed    Modified Rankin (Stroke Patients Only)       Balance Overall balance assessment: No apparent balance deficits (not formally assessed)                                           Pertinent Vitals/Pain Pain Assessment Pain Assessment: 0-10 Pain Score: 7  Pain Location: Low back Pain Descriptors / Indicators: Grimacing, Guarding Pain Intervention(s):  Limited activity within patient's tolerance, Monitored during session    Home Living Family/patient expects to be discharged to:: Private residence Living Arrangements: Spouse/significant other;Other relatives Available Help at Discharge: Family;Available 24 hours/day Type of Home: House Home Access: Stairs to  enter Entrance Stairs-Rails:  (1 rail, pt unsure of which side) Entrance Stairs-Number of Steps: 2   Home Layout: One level Home Equipment: None      Prior Function Prior Level of Function : Independent/Modified Independent;Working/employed             Mobility Comments: Typically walking with no AD. ADLs Comments: Independent. 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     Extremity/Trunk Assessment   Upper Extremity Assessment Upper Extremity Assessment: Defer to OT evaluation    Lower Extremity Assessment Lower Extremity Assessment: Overall WFL for tasks assessed    Cervical / Trunk Assessment Cervical / Trunk Assessment: Back Surgery  Communication   Communication Communication: Impaired Factors Affecting Communication: Reduced clarity of speech    Cognition Arousal: Alert, Lethargic Behavior During Therapy: Flat affect   PT - Cognitive impairments: Safety/Judgement                       PT - Cognition Comments: Pt fairly lethargic in sitting and supine, even after ambulating. Would doze off during conversation, but would easily arouse again. Required cueing for safety during tranfsers and obstale navigation as she is slightly impulsive Following commands: Intact       Cueing Cueing Techniques: Verbal cues     General Comments General comments (skin integrity, edema, etc.): Walking sats performed (see other note), and pt would benefit from O2 at home. Not on O2 at baseline. VC for incentive spirometry use and back precautions.    Exercises     Assessment/Plan    PT Assessment Patient needs continued PT services  PT Problem List Decreased activity tolerance;Decreased mobility;Decreased safety awareness;Decreased knowledge of use of DME;Pain       PT Treatment Interventions Gait training;DME instruction;Stair training;Functional mobility training;Therapeutic activities;Therapeutic exercise;Balance training;Neuromuscular  re-education;Patient/family education;Manual techniques;Cognitive remediation;Modalities    PT Goals (Current goals can be found in the Care Plan section)  Acute Rehab PT Goals Patient Stated Goal: to go home PT Goal Formulation: With patient Time For Goal Achievement: 03/15/24 Potential to Achieve Goals: Good    Frequency Min 5X/week     Co-evaluation               AM-PAC PT 6 Clicks Mobility  Outcome Measure Help needed turning from your back to your side while in a flat bed without using bedrails?: None Help needed moving from lying on your back to sitting on the side of a flat bed without using bedrails?: None Help needed moving to and from a bed to a chair (including a wheelchair)?: A Little Help needed standing up from a chair using your arms (e.g., wheelchair or bedside chair)?: A Little Help needed to walk in hospital room?: A Little Help needed climbing 3-5 steps with a railing? : None 6 Click Score: 21    End of Session Equipment Utilized During Treatment: Gait belt Activity Tolerance: Patient tolerated treatment well Patient left: in chair;with call bell/phone within reach Nurse Communication: Mobility status PT Visit Diagnosis: Other abnormalities of gait and mobility (R26.89)    Time: 9157-9089 PT Time Calculation (min) (ACUTE ONLY): 28 min   Charges:   PT Evaluation $PT Eval Low Complexity: 1 Low PT Treatments $Therapeutic Activity: 8-22 mins  PT General Charges $$ ACUTE PT VISIT: 1 Visit         Akshat Minehart, SPT   Arnold Kester 03/01/2024, 10:11 AM

## 2024-03-04 MED FILL — Sodium Chloride IV Soln 0.9%: INTRAVENOUS | Qty: 2000 | Status: AC

## 2024-03-04 MED FILL — Heparin Sodium (Porcine) Inj 1000 Unit/ML: INTRAMUSCULAR | Qty: 30 | Status: AC

## 2024-03-14 ENCOUNTER — Emergency Department (HOSPITAL_COMMUNITY): Admission: EM | Admit: 2024-03-14 | Source: Home / Self Care

## 2024-03-14 ENCOUNTER — Ambulatory Visit: Admitting: Internal Medicine

## 2024-03-14 ENCOUNTER — Encounter (HOSPITAL_COMMUNITY): Payer: Self-pay

## 2024-03-14 ENCOUNTER — Other Ambulatory Visit: Payer: Self-pay

## 2024-03-14 DIAGNOSIS — S3011XA Contusion of abdominal wall, initial encounter: Secondary | ICD-10-CM

## 2024-03-14 DIAGNOSIS — X58XXXA Exposure to other specified factors, initial encounter: Secondary | ICD-10-CM | POA: Diagnosis not present

## 2024-03-14 DIAGNOSIS — S31109A Unspecified open wound of abdominal wall, unspecified quadrant without penetration into peritoneal cavity, initial encounter: Secondary | ICD-10-CM | POA: Diagnosis not present

## 2024-03-14 DIAGNOSIS — R109 Unspecified abdominal pain: Secondary | ICD-10-CM | POA: Diagnosis present

## 2024-03-14 NOTE — ED Triage Notes (Signed)
 Pt from home complains of Abdominal pain, pt had spinal surgery 2 weeks ago, pt also has distention at anterior surgical site, site appears red and warm to touch, no drainage noted. Pt on 5L [place] @ baseline. Endorse N/V. Pt aaox4, ambulatory at baseline.

## 2024-03-15 ENCOUNTER — Emergency Department (HOSPITAL_COMMUNITY)

## 2024-03-15 LAB — CBC WITH DIFFERENTIAL/PLATELET
Abs Immature Granulocytes: 0.16 K/uL — ABNORMAL HIGH (ref 0.00–0.07)
Basophils Absolute: 0.1 K/uL (ref 0.0–0.1)
Basophils Relative: 1 %
Eosinophils Absolute: 0.2 K/uL (ref 0.0–0.5)
Eosinophils Relative: 2 %
HCT: 41.3 % (ref 36.0–46.0)
Hemoglobin: 13 g/dL (ref 12.0–15.0)
Immature Granulocytes: 2 %
Lymphocytes Relative: 29 %
Lymphs Abs: 2.8 K/uL (ref 0.7–4.0)
MCH: 30 pg (ref 26.0–34.0)
MCHC: 31.5 g/dL (ref 30.0–36.0)
MCV: 95.4 fL (ref 80.0–100.0)
Monocytes Absolute: 1 K/uL (ref 0.1–1.0)
Monocytes Relative: 11 %
Neutro Abs: 5.5 K/uL (ref 1.7–7.7)
Neutrophils Relative %: 55 %
Platelets: 528 K/uL — ABNORMAL HIGH (ref 150–400)
RBC: 4.33 MIL/uL (ref 3.87–5.11)
RDW: 13.2 % (ref 11.5–15.5)
WBC: 9.9 K/uL (ref 4.0–10.5)
nRBC: 0 % (ref 0.0–0.2)

## 2024-03-15 LAB — COMPREHENSIVE METABOLIC PANEL WITH GFR
ALT: 15 U/L (ref 0–44)
AST: 25 U/L (ref 15–41)
Albumin: 3.8 g/dL (ref 3.5–5.0)
Alkaline Phosphatase: 83 U/L (ref 38–126)
Anion gap: 7 (ref 5–15)
BUN: 10 mg/dL (ref 8–23)
CO2: 30 mmol/L (ref 22–32)
Calcium: 9.1 mg/dL (ref 8.9–10.3)
Chloride: 102 mmol/L (ref 98–111)
Creatinine, Ser: 0.7 mg/dL (ref 0.44–1.00)
GFR, Estimated: >60 mL/min
Glucose, Bld: 92 mg/dL (ref 70–99)
Potassium: 4.4 mmol/L (ref 3.5–5.1)
Sodium: 138 mmol/L (ref 135–145)
Total Bilirubin: 0.2 mg/dL (ref 0.0–1.2)
Total Protein: 7.3 g/dL (ref 6.5–8.1)

## 2024-03-15 LAB — LIPASE, BLOOD: Lipase: 48 U/L (ref 11–51)

## 2024-03-15 MED ORDER — CEPHALEXIN 500 MG PO CAPS
500.0000 mg | ORAL_CAPSULE | Freq: Once | ORAL | Status: AC
  Administered 2024-03-15: 500 mg via ORAL
  Filled 2024-03-15: qty 1

## 2024-03-15 MED ORDER — IOHEXOL 300 MG/ML  SOLN
100.0000 mL | Freq: Once | INTRAMUSCULAR | Status: AC | PRN
  Administered 2024-03-15: 100 mL via INTRAVENOUS

## 2024-03-15 MED ORDER — SODIUM CHLORIDE 0.9 % IV BOLUS
1000.0000 mL | Freq: Once | INTRAVENOUS | Status: AC
  Administered 2024-03-15: 1000 mL via INTRAVENOUS

## 2024-03-15 MED ORDER — CEPHALEXIN 500 MG PO CAPS: 500.0000 mg | ORAL_CAPSULE | Freq: Three times a day (TID) | ORAL | 0 refills | Status: AC

## 2024-03-15 NOTE — ED Notes (Signed)
 Pt is in CT

## 2024-03-15 NOTE — Discharge Instructions (Signed)
 Begin taking Keflex  as prescribed.  Follow-up with your neurosurgeon in the next week, and return to the ER if symptoms significantly worsen or change.

## 2024-03-15 NOTE — ED Provider Notes (Signed)
 " [place] EMERGENCY DEPARTMENT AT [facility] Provider Note   CSN: [id number] Arrival date & time: 03/14/24  2047     Patient presents with: Abdominal Pain   [name] is a 63 y.o. female.   Patient is a 63 year old female with history of recent spinal surgery with anterior/posterior approach.  This was performed at [facility] 2 weeks ago.  Patient presenting today with complaints of redness surrounding the surgical incision anteriorly as well as some distention of her abdomen.  Her bowels have been working.  She denies any fevers or chills.  She is concerned she may have a hernia.       Prior to Admission medications  Medication Sig Start Date End Date Taking? Authorizing Provider  albuterol  (VENTOLIN  HFA) 108 (90 Base) MCG/ACT inhaler Inhale 2 puffs into the lungs every 6 (six) hours as needed for wheezing or shortness of breath. 04/03/23   Tobie Suzzane POUR, MD  amLODipine  (NORVASC ) 10 MG tablet Take 1 tablet by mouth once daily 01/25/24   Patel, Rutwik K, MD  Budeson-Glycopyrrol-Formoterol  (BREZTRI  AEROSPHERE) 160-9-4.8 MCG/ACT AERO Inhale 2 puffs into the lungs 2 (two) times daily. 04/03/23   Tobie Suzzane POUR, MD  cyclobenzaprine  (FLEXERIL ) 10 MG tablet Take 1 tablet (10 mg total) by mouth 3 (three) times daily as needed for muscle spasms. 03/01/24   Darnella Dorn SAUNDERS, MD  docusate sodium  (COLACE) 100 MG capsule Take 1 capsule (100 mg total) by mouth daily. 03/01/24 03/01/25  Darnella Dorn SAUNDERS, MD  fluticasone  (FLONASE ) 50 MCG/ACT nasal spray Place 2 sprays into both nostrils daily. Patient not taking: Reported on 02/16/2024 04/19/23   Tobie Suzzane POUR, MD  guaiFENesin -codeine  100-10 MG/5ML syrup Take 5 mLs by mouth 3 (three) times daily as needed for cough. Patient not taking: Reported on 02/16/2024 04/19/23   Tobie Suzzane POUR, MD  magic mouthwash (lidocaine , diphenhydrAMINE , alum & mag hydroxide) suspension Swish and swallow 5 mLs 3 (three) times daily as needed for mouth  pain. Patient not taking: Reported on 02/16/2024 04/19/23   Tobie Suzzane POUR, MD  meloxicam  (MOBIC ) 7.5 MG tablet Take 1 tablet (7.5 mg total) by mouth daily. Patient not taking: Reported on 02/16/2024 09/19/23   Tobie Suzzane POUR, MD  methocarbamol  (ROBAXIN ) 500 MG tablet Take 1 tablet (500 mg total) by mouth every 8 (eight) hours as needed for muscle spasms. Patient not taking: Reported on 02/16/2024 09/19/23   Tobie Suzzane POUR, MD  omeprazole  (PRILOSEC) 40 MG capsule Take 1 capsule (40 mg total) by mouth daily. 04/03/23   Tobie Suzzane POUR, MD  ondansetron  (ZOFRAN -ODT) 4 MG disintegrating tablet Take 1 tablet (4 mg total) by mouth every 8 (eight) hours as needed for nausea or vomiting. Patient not taking: Reported on 02/16/2024 02/04/22   Chandra Harlene LABOR, NP  oxyCODONE  (OXY IR/ROXICODONE ) 5 MG immediate release tablet Take 1 tablet (5 mg total) by mouth every 4 (four) hours as needed for moderate pain (pain score 4-6). 03/01/24   Darnella Dorn SAUNDERS, MD  phenazopyridine  (PYRIDIUM ) 200 MG tablet Take 1 tablet (200 mg total) by mouth 3 (three) times daily as needed for pain. Patient not taking: Reported on 02/16/2024 01/22/24   Stuart Vernell Norris, PA-C  predniSONE  (STERAPRED UNI-PAK 21 TAB) 10 MG (21) TBPK tablet Take as package instructions. Patient not taking: Reported on 02/16/2024 09/19/23   Tobie Suzzane POUR, MD  sulfamethoxazole -trimethoprim  (BACTRIM  DS) 800-160 MG tablet Take 1 tablet by mouth 2 (two) times daily. Patient not taking: Reported on  02/16/2024 01/22/24   Stuart Vernell Norris, PA-C  telmisartan -hydrochlorothiazide  (MICARDIS  HCT) 80-25 MG tablet Take 1 tablet by mouth daily. 01/22/24   Tobie Suzzane POUR, MD    Allergies: Penicillins and Tape    Review of Systems  All other systems reviewed and are negative.   Updated Vital Signs BP 137/73   Pulse 76   Temp 98.6 F (37 C) (Oral)   Resp 16   Ht 5' (1.524 m)   Wt 59 kg   SpO2 99%   BMI 25.39 kg/m   Physical Exam Vitals  and nursing note reviewed.  Constitutional:      General: She is not in acute distress.    Appearance: She is well-developed. She is not diaphoretic.  HENT:     Head: Normocephalic and atraumatic.  Cardiovascular:     Rate and Rhythm: Normal rate and regular rhythm.     Heart sounds: No murmur heard.    No friction rub. No gallop.  Pulmonary:     Effort: Pulmonary effort is normal. No respiratory distress.     Breath sounds: Normal breath sounds. No wheezing.  Abdominal:     General: Bowel sounds are normal. There is no distension.     Palpations: Abdomen is soft.     Tenderness: There is no abdominal tenderness. There is no guarding or rebound.     Comments: The surgical incision on the anterior abdominal wall does have mild erythema and warmth.  There is some swelling to the soft tissues beneath the incision, but I am unable to palpate any definite fluctuance.  Musculoskeletal:        General: Normal range of motion.     Cervical back: Normal range of motion and neck supple.  Skin:    General: Skin is warm and dry.  Neurological:     General: No focal deficit present.     Mental Status: She is alert and oriented to person, place, and time.     (all labs ordered are listed, but only abnormal results are displayed) Labs Reviewed - No data to display  EKG: None  Radiology: No results found.   Procedures   Medications Ordered in the ED  sodium chloride  0.9 % bolus 1,000 mL (has no administration in time range)                                    Medical Decision Making Amount and/or Complexity of Data Reviewed Labs: ordered. Radiology: ordered.  Risk Prescription drug management.   Patient presenting here with complaints of abdominal distention underlying her surgical incision.  She had an anterior approach performed for back surgery by neurosurgery 2 weeks ago.  Patient arrives here with stable vital signs and is afebrile.  The incision on the back appears to be  healing well.  The incision on the front also appears to be healing well, but has mild surrounding erythema, but no warmth.  She does have some distention deep to the incision, but no palpable fluctuance or abdominal wall defect.  Laboratory studies obtained including CBC and basic metabolic panel, both of which are unremarkable.  I also obtained a CT scan of the abdomen and pelvis showing a large, irregular fluid collection in the anterior abdominal wall, favored to be a postoperative seroma but infection unable to be excluded.  Patient is having no fever, has no white count, and I highly doubt that this  fluid collection is an abscess.  I agree with radiology that this most likely represents a seroma and that no emergent intervention is indicated.  I have advised the patient that I will message her neurosurgeon to make sure he is aware of the CT scan findings and arrange follow-up.  I have decided to prescribe Keflex, not for the large fluid collection, but for the mild erythema surrounding the surgical incision.     Final diagnoses:  None    ED Discharge Orders     None          Geroldine Berg, MD 03/15/24 [phone]  "

## 2024-03-18 ENCOUNTER — Other Ambulatory Visit: Payer: Self-pay | Admitting: Internal Medicine

## 2024-03-18 DIAGNOSIS — J449 Chronic obstructive pulmonary disease, unspecified: Secondary | ICD-10-CM

## 2024-06-11 ENCOUNTER — Encounter

## 2024-06-11 ENCOUNTER — Ambulatory Visit: Admitting: Vascular Surgery
